# Patient Record
Sex: Male | Born: 1960
Health system: Southern US, Community
[De-identification: ages and names within clinical notes are randomized; demographics above are authoritative.]

## PROBLEM LIST (undated history)

## (undated) DIAGNOSIS — J209 Acute bronchitis, unspecified: Secondary | ICD-10-CM

## (undated) DIAGNOSIS — G4730 Sleep apnea, unspecified: Secondary | ICD-10-CM

## (undated) DIAGNOSIS — N401 Enlarged prostate with lower urinary tract symptoms: Secondary | ICD-10-CM

## (undated) DIAGNOSIS — N529 Male erectile dysfunction, unspecified: Secondary | ICD-10-CM

## (undated) DIAGNOSIS — I10 Essential (primary) hypertension: Secondary | ICD-10-CM

## (undated) DIAGNOSIS — T7840XA Allergy, unspecified, initial encounter: Secondary | ICD-10-CM

## (undated) DIAGNOSIS — N419 Inflammatory disease of prostate, unspecified: Secondary | ICD-10-CM

## (undated) DIAGNOSIS — E669 Obesity, unspecified: Secondary | ICD-10-CM

## (undated) DIAGNOSIS — G4733 Obstructive sleep apnea (adult) (pediatric): Secondary | ICD-10-CM

## (undated) DIAGNOSIS — D126 Benign neoplasm of colon, unspecified: Secondary | ICD-10-CM

## (undated) DIAGNOSIS — I878 Other specified disorders of veins: Secondary | ICD-10-CM

## (undated) DIAGNOSIS — Z8719 Personal history of other diseases of the digestive system: Secondary | ICD-10-CM

## (undated) DIAGNOSIS — M109 Gout, unspecified: Secondary | ICD-10-CM

## (undated) HISTORY — DX: Obesity, unspecified: E66.9

## (undated) HISTORY — DX: Morbid (severe) obesity due to excess calories: E66.01

## (undated) HISTORY — DX: Obstructive sleep apnea (adult) (pediatric): G47.33

## (undated) HISTORY — PX: VARICOCELE EXCISION: SUR582

## (undated) HISTORY — DX: Sleep apnea, unspecified: G47.30

## (undated) HISTORY — PX: LASER ABLATION: SHX1947

## (undated) HISTORY — DX: Benign neoplasm of colon, unspecified: D12.6

## (undated) HISTORY — DX: Allergy, unspecified, initial encounter: T78.40XA

## (undated) HISTORY — DX: Acute bronchitis, unspecified: J20.9

## (undated) HISTORY — DX: Essential (primary) hypertension: I10

---

## 1997-10-21 ENCOUNTER — Encounter: Payer: Self-pay | Admitting: Internal Medicine

## 1998-02-24 ENCOUNTER — Ambulatory Visit: Admission: RE | Admit: 1998-02-24 | Discharge: 1998-02-24 | Payer: Self-pay | Admitting: Internal Medicine

## 2003-04-28 ENCOUNTER — Ambulatory Visit (HOSPITAL_BASED_OUTPATIENT_CLINIC_OR_DEPARTMENT_OTHER): Admission: RE | Admit: 2003-04-28 | Discharge: 2003-04-28 | Payer: Self-pay | Admitting: Internal Medicine

## 2005-01-02 ENCOUNTER — Ambulatory Visit: Payer: Self-pay | Admitting: Internal Medicine

## 2007-02-23 ENCOUNTER — Encounter: Admission: RE | Admit: 2007-02-23 | Discharge: 2007-02-23 | Payer: Self-pay | Admitting: Internal Medicine

## 2007-05-13 ENCOUNTER — Encounter: Admission: RE | Admit: 2007-05-13 | Discharge: 2007-08-11 | Payer: Self-pay | Admitting: Internal Medicine

## 2007-06-05 ENCOUNTER — Ambulatory Visit: Payer: Self-pay | Admitting: Internal Medicine

## 2008-01-18 ENCOUNTER — Ambulatory Visit: Payer: Self-pay | Admitting: Vascular Surgery

## 2008-06-02 DIAGNOSIS — I10 Essential (primary) hypertension: Secondary | ICD-10-CM | POA: Insufficient documentation

## 2008-06-02 DIAGNOSIS — G4733 Obstructive sleep apnea (adult) (pediatric): Secondary | ICD-10-CM | POA: Insufficient documentation

## 2008-06-02 HISTORY — DX: Morbid (severe) obesity due to excess calories: E66.01

## 2008-06-03 ENCOUNTER — Ambulatory Visit: Payer: Self-pay | Admitting: Internal Medicine

## 2008-09-02 ENCOUNTER — Ambulatory Visit: Payer: Self-pay | Admitting: Vascular Surgery

## 2008-11-02 ENCOUNTER — Ambulatory Visit: Payer: Self-pay | Admitting: Gastroenterology

## 2008-11-03 DIAGNOSIS — D126 Benign neoplasm of colon, unspecified: Secondary | ICD-10-CM

## 2008-11-03 HISTORY — DX: Benign neoplasm of colon, unspecified: D12.6

## 2008-11-07 ENCOUNTER — Encounter: Payer: Self-pay | Admitting: Gastroenterology

## 2008-11-07 ENCOUNTER — Ambulatory Visit (HOSPITAL_COMMUNITY): Admission: RE | Admit: 2008-11-07 | Discharge: 2008-11-07 | Payer: Self-pay | Admitting: Gastroenterology

## 2008-11-07 ENCOUNTER — Ambulatory Visit: Payer: Self-pay | Admitting: Gastroenterology

## 2008-11-09 ENCOUNTER — Encounter: Payer: Self-pay | Admitting: Gastroenterology

## 2009-03-29 ENCOUNTER — Encounter: Payer: Self-pay | Admitting: Internal Medicine

## 2009-04-06 ENCOUNTER — Telehealth (INDEPENDENT_AMBULATORY_CARE_PROVIDER_SITE_OTHER): Payer: Self-pay | Admitting: *Deleted

## 2009-04-22 ENCOUNTER — Encounter: Payer: Self-pay | Admitting: Internal Medicine

## 2009-11-01 ENCOUNTER — Telehealth: Payer: Self-pay | Admitting: Internal Medicine

## 2009-11-02 ENCOUNTER — Telehealth (INDEPENDENT_AMBULATORY_CARE_PROVIDER_SITE_OTHER): Payer: Self-pay | Admitting: *Deleted

## 2009-11-03 ENCOUNTER — Ambulatory Visit: Payer: Self-pay | Admitting: Internal Medicine

## 2009-11-13 ENCOUNTER — Telehealth: Payer: Self-pay | Admitting: Internal Medicine

## 2010-06-29 ENCOUNTER — Ambulatory Visit: Payer: Self-pay | Admitting: Internal Medicine

## 2010-06-29 DIAGNOSIS — J209 Acute bronchitis, unspecified: Secondary | ICD-10-CM | POA: Insufficient documentation

## 2010-09-04 NOTE — Progress Notes (Signed)
Summary: order  Phone Note Call from Patient Call back at Home Phone [phone]   Caller: Patient Call For: [name] Reason for Call: Talk to Nurse Summary of Call: fax to [facility] order for cpap supplies - mask & head gear respironic comfort gel Initial call taken by: Eugene Gavia,  November 01, 2009 4:02 PM  Follow-up for Phone Call        Pt states [facility] is no longer in network for his insurance so he needs a new order sent to [facility] for cpap supplies, new mask of choice, head gear, and respironic comfort gel. Please advise if ok to palce order. Also pt wants refill on temazepam sent to [facility]. Please advise. Carron Curie CMA  November 01, 2009 4:26 PM   Additional Follow-up for Phone Call Additional follow up Details #1::        1) i will direct CPAP mask order through [facility].   2) May refill his temazepam with 1 refill  3) He needs a routine OV scheduled with me. He is well past a year since last here.  Additional Follow-up by: Waymon Budge MD,  November 02, 2009 8:48 AM    Additional Follow-up for Phone Call Additional follow up Details #2::    LMTCB. Carron Curie CMA  November 02, 2009 8:53 AM  pt advised that order was placed for cpap mask and supplies and refill sent for temazepam. Also advised he needs f/u appt.  Pt scheduled to see CY tomorrow at 9 am. Rx called into pharmacy. Carron Curie CMA  November 02, 2009 10:45 AM    Prescriptions: TEMAZEPAM 15 MG CAPS (TEMAZEPAM) 1 at bedtime as needed  #30 x 1   Entered by:   Carron Curie CMA   Authorized by:   Waymon Budge MD   Signed by:   Carron Curie CMA on 11/02/2009   Method used:   Telephoned to ...       [facility]* (retail)       [address]       [place], [place]  [id number]       Ph: [phone]       Fax: [phone]   RxID:   [id number]

## 2010-09-04 NOTE — Assessment & Plan Note (Signed)
Summary: rov/ mbw   Primary Provider/Referring Provider:  Geoffry Paradise  CC:  Follow up-sleep using CPAP and doing well; C/O cough- productive at times-clear and thick to yellow in color, SOB, and and chest tightness.Marland Kitchen  History of Present Illness: From 06/07/07-  Last seen at the old office in October of 2005.  He has continued CPAP at 11 CWP but thinks he may need a pressure increase.  No specific problems.  He is not really aware of significant daytime sleepiness and is not told that he is snoring through the machine.  We had recorded a weight of 323 pounds in October of 2004 and no significant weight gain since then.   06/03/08- OSA has done well with cpap left at 11 cwp. No hx ENT surgery.  November 03, 2009- OSA, Allergic rhintis( Dr Stevphen Rochester)  He changed to [facility] for DME in his network and they need copy of sleep study. He continues at pressure 11 with a nasal mask.He is fully compliant using cpap all night every night. He denies snore through or significant daytime sleepiness and says he couldn't do without cpap now. Temazepam works ok used only occasionally. Rhinitis control is ok, not interfering with CPAP.  June 29, 2010- OSA, Allergic rhintis( Dr Stevphen Rochester)  Nurse-CC: Follow up-sleep using CPAP and doing well; C/O cough- productive at times-clear and thick to yellow in color, SOB, and chest tightness. Acute visit- says for weeks he has had tight chest, dry hacking cough, upper airway tickle., scant white phlegm. Z pak from PCP and mucinex did help some. Denies fever, sorethroat, ear ache or heart burn.      Preventive Screening-Counseling & Management  Alcohol-Tobacco     Smoking Status: never  Current Medications (verified): 1)  Lisinopril-Hydrochlorothiazide 20-12.5 Mg Tabs (Lisinopril-Hydrochlorothiazide) .Marland Kitchen.. 1 Once Daily 2)  Claritin 10 Mg Tabs (Loratadine) .Marland Kitchen.. 1 Once Daily 3)  Cpap 11 .... At Bedtime 4)  Temazepam 15 Mg Caps (Temazepam) .Marland Kitchen.. 1 At  Bedtime As Needed 5)  Allergy Vaccine .... Stevphen Rochester 6)  Fluticasone Propionate 50 Mcg/act Susp (Fluticasone Propionate) .Marland Kitchen.. 1-2 Puffs Each Nostril Once Daily 7)  Claritin-D 12 Hour 5-120 Mg Xr12h-Tab (Loratadine-Pseudoephedrine) .... 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 Single   Risk Factors: Smoking Status: never (06/29/2010)  Past Medical History: OBESITY (ICD-278.00) HYPERTENSION (ICD-401.9) OBSTRUCTIVE SLEEP APNEA (ICD-327.23) Acute bronchitis  Review of Systems      See HPI       The patient complains of productive cough and nasal congestion/difficulty breathing through nose.  The patient denies shortness of breath with activity, shortness of breath at rest, non-productive cough, coughing up blood, chest pain, irregular heartbeats, acid heartburn, indigestion, loss of appetite, weight change, abdominal pain, difficulty swallowing, sore throat, tooth/dental problems, headaches, and sneezing.    Vital Signs:  Patient profile:   50 year old male Height:      75 inches Weight:      356.13 pounds BMI:     44.67 O2 Sat:      95 % on Room air Pulse rate:   84 / minute BP sitting:   132 / 88  (left arm) Cuff size:   large  Vitals Entered By: Reynaldo Minium CMA (June 29, 2010 2:37 PM)  O2 Flow:  Room air CC: Follow up-sleep using CPAP and doing well; C/O cough- productive at times-clear and thick to yellow in color, SOB, and chest tightness.   Physical Exam  Additional Exam:  General: A/Ox3; pleasant and cooperative, NAD, obese, tall, rather passive man SKIN: no rash, lesions NODES: no lymphadenopathy HEENT: [place]/AT, EOM- WNL, Conjuctivae- clear, PERRLA, TM-WNL, Nose- clear,  Throat- clear and wnl, Mallampati  II NECK: Supple w/ fair ROM, JVD- none, normal carotid impulses w/o bruits Thyroid- normal to palpation CHEST: Clear to P&A, not coughing or overtly wheezing.  HEART: RRR, no m/g/r heard ABDOMEN: Soft and nl;  JWJ:XBJY, nl pulses, no edema  NEURO: Grossly intact to observation      Impression & Recommendations:  Problem # 1:  ACUTE BRONCHITIS (ICD-466.0)  This is a bronchitis pattern. He isn't overtly wheezing and denies reflux. He has been on lisinopril 3 years w/o recognized problem but we discussed the possibility of ACEI side effects as well. For today, we will give him a neb, depo and another Zpak. He thinks the first Zpak made a real difference and suggests retrial.  His updated medication list for this problem includes:    Claritin-d 12 Hour 5-120 Mg Xr12h-tab (Loratadine-pseudoephedrine) .Marland Kitchen... Take 1 by mouth once daily  as needed    Zithromax Z-pak 250 Mg Tabs (Azithromycin) .Marland Kitchen... 2 today then one daily  Problem # 2:  OBSTRUCTIVE SLEEP APNEA (ICD-327.23)  Compliant w/ CPAP. Weight loss would help.   Orders: Est. Patient Level III (78295)  Medications Added to Medication List This Visit: 1)  Cpap 11  .... At bedtime 2)  Claritin-d 12 Hour 5-120 Mg Xr12h-tab (Loratadine-pseudoephedrine) .... Take 1 by mouth once daily  as needed 3)  Zithromax Z-pak 250 Mg Tabs (Azithromycin) .... 2 today then one daily  Other Orders: Depo- Medrol 80mg  (J1040) Admin of Therapeutic Inj  intramuscular or subcutaneous (62130) Nebulizer Tx (86578)  Patient Instructions: 1)  Please schedule a follow-up appointment in 6 months. 2)  Neb xop 1.25 3)  depo 80 4)  script for Zpak 5)  script to refill Temazepam Prescriptions: TEMAZEPAM 15 MG CAPS (TEMAZEPAM) 1 at bedtime as needed  #30 x 1   Entered and Authorized by:   Waymon Budge MD   Signed by:   Waymon Budge MD on 06/29/2010   Method used:   Print then Give to Patient   RxID:    [id number] ZITHROMAX Z-PAK 250 MG TABS (AZITHROMYCIN) 2 today then one daily  #1 pak x 0   Entered and Authorized by:   Waymon Budge MD   Signed by:   Waymon Budge MD on 06/29/2010   Method used:   Print then Give to Patient   RxID:   [id number]      Medication Administration  Injection # 1:    Medication: Depo- Medrol 80mg     Diagnosis: ACUTE BRONCHITIS (ICD-466.0)    Route: IM    Site: L deltoid    Exp Date: 12/2012    Lot #: [id number]    Mfr: Pharmacia    Patient tolerated injection without complications    Given by: Randell Loop CMA (June 29, 2010 3:08 PM)  Orders Added: 1)  Depo- Medrol 80mg  [J1040] 2)  Admin of Therapeutic Inj  intramuscular or subcutaneous [96372] 3)  Nebulizer Tx [94640] 4)  Est. Patient Level III [25956]

## 2010-09-04 NOTE — Progress Notes (Signed)
Summary: [facility] waiting on order/ cpap supplies  Phone Note Call from Patient   Caller: Patient Call For: young Summary of Call: pt states that he spoke to [facility] "moments ago" and was told that they have NOT received a fax for cpap supplies. he needs these asap. their fax # per pt is: [phone]. pt # [phone] Initial call taken by: Tivis Ringer, CNA,  November 02, 2009 10:59 AM  Follow-up for Phone Call        spoke to [facility] and they received the order but pt is a new pt with them and they need sleep study. Carron Curie CMA  November 02, 2009 11:20 AM    Additional Follow-up for Phone Call Additional follow up Details #1::        ordered pt's paper chart to get copy of sleep study and will fax to [facility] Additional Follow-up by: Oneita Jolly,  November 02, 2009 11:50 AM

## 2010-09-04 NOTE — Progress Notes (Signed)
Summary: Rx for cpap supplies-refused by pt  Phone Note From Other Clinic Call back at [phone]   Caller: Efraim Kaufmann with [facility] Call For: Dr. Maple Hudson Summary of Call: [phone] Melissa with [facility] called and stated that pt has large ded on insurance and does not want to be supplied with cpap supplies as of now.  Just an FYI for you. Thanks, Alfonso Ramus  November 13, 2009 2:55 PM   Follow-up for Phone Call        noted Follow-up by: Waymon Budge MD,  November 13, 2009 3:27 PM

## 2010-09-04 NOTE — Assessment & Plan Note (Signed)
Summary: 1 yr f/u last seen 2009//jrc   Primary Provider/Referring Provider:  Geoffry Paradise  CC:  Yearly follow up visit-sleep.  History of Present Illness: From 06/07/07-  Last seen at the old office in October of 2005.  He has continued CPAP at 11 CWP but thinks he may need a pressure increase.  No specific problems.  He is not really aware of significant daytime sleepiness and is not told that he is snoring through the machine.  We had recorded a weight of 323 pounds in October of 2004 and no significant weight gain since then.   06/03/08- OSA has done well with cpap left at 11 cwp. No hx ENT surgery.  November 03, 2009- OSA, Allergic rhintis( Dr Stevphen Rochester)  He changed to [facility] for DME in his network and they need copy of sleep study. He continues at pressure 11 with a nasal mask.He is fully compliant using cpap all night every night. He denies snore through or significant daytime sleepiness and says he couldn't do without cpap now. Temazepam works ok used only occasionally. Rhinitis control is ok, not interfering with CPAP.       Preventive Screening-Counseling & Management  Alcohol-Tobacco     Smoking Status: never  Current Medications (verified): 1)  Lisinopril-Hydrochlorothiazide 20-12.5 Mg Tabs (Lisinopril-Hydrochlorothiazide) .Marland Kitchen.. 1 Once Daily 2)  Claritin 10 Mg Tabs (Loratadine) .Marland Kitchen.. 1 Once Daily 3)  Cpap 11 [facility] .... At Bedtime 4)  Temazepam 15 Mg Caps (Temazepam) .Marland Kitchen.. 1 At Bedtime As Needed 5)  Allergy Vaccine .... 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 Single   Review of Systems      See HPI  The patient denies anorexia, fever, weight loss, weight gain, vision loss, decreased hearing, hoarseness, chest pain, syncope, dyspnea on exertion, peripheral edema, prolonged cough, headaches, hemoptysis, and severe indigestion/heartburn.    Vital Signs:  Patient profile:   50 year old male Height:      75 inches Weight:      369.25 pounds BMI:     46.32 O2 Sat:      99 % on Room air Pulse rate:   72 / minute BP sitting:   124 / 92  (left arm) Cuff size:   large  Vitals Entered By: Reynaldo Minium CMA (November 03, 2009 9:12 AM)  O2 Flow:  Room air  Physical Exam  Additional Exam:  General: A/Ox3; pleasant and cooperative, NAD, obese, tall, rather passive man SKIN: no rash, lesions NODES: no lymphadenopathy HEENT: [place]/AT, EOM- WNL, Conjuctivae- clear, PERRLA, TM-WNL, Nose- clear, Throat- clear and wnl, Mallampati  II NECK: Supple w/ fair ROM, JVD- none, normal carotid impulses w/o bruits Thyroid- normal to palpation CHEST: Clear to P&A HEART: RRR, no m/g/r heard ABDOMEN: Soft and nl;  JXB:JYNW, nl pulses, no edema  NEURO: Grossly intact to observation      Impression & Recommendations:  Problem # 1:  OBSTRUCTIVE SLEEP APNEA (ICD-327.23)  Weight loss would help. He is comfortable and compliant with CPAP at the current pressure. We have updated his electronic  record.  Medications Added to Medication List This Visit: 1)  Cpap 11 [facility]  .... At bedtime 2)  Fluticasone Propionate 50 Mcg/act Susp (Fluticasone propionate) .Marland Kitchen.. 1-2 puffs each nostril once daily  Other Orders: Est. Patient Level III (16109)  Patient Instructions: 1)  [facility] needs copy of sleep study. Need paper chart 2)  Please schedule a follow-up appointment in 1 year.

## 2010-09-13 ENCOUNTER — Encounter (INDEPENDENT_AMBULATORY_CARE_PROVIDER_SITE_OTHER): Payer: PRIVATE HEALTH INSURANCE

## 2010-09-13 DIAGNOSIS — M79609 Pain in unspecified limb: Secondary | ICD-10-CM

## 2010-09-17 ENCOUNTER — Encounter (INDEPENDENT_AMBULATORY_CARE_PROVIDER_SITE_OTHER): Payer: PRIVATE HEALTH INSURANCE | Admitting: Vascular Surgery

## 2010-09-17 ENCOUNTER — Encounter: Payer: PRIVATE HEALTH INSURANCE | Admitting: Vascular Surgery

## 2010-09-17 DIAGNOSIS — I83893 Varicose veins of bilateral lower extremities with other complications: Secondary | ICD-10-CM

## 2010-09-18 NOTE — Consult Note (Signed)
NEW PATIENT CONSULTATION  Casey Roach, NABERS DOB:  April 21, 1961                                       09/17/2010 [id number]#:[id number]  Patient is a 50 year old male patient referred for severe venous insufficiency of the left leg.  This gentleman has had varicose veins in both legs for 10-15 years which have increased in size.  He has no history of deep venous thrombosis, thrombophlebitis, pulmonary emboli, or other clotting problems.  He developed a sore on his left lateral ankle a few weeks ago which has waxed and waned and is not resolved.  He also had an episode of bleeding 2-3 months ago in the same area.  He notices some swelling in the ankles and the feet as the day progresses and has been having tightness in his left calf over the last several weeks related to this above problem.  He states that he has aching, throbbing, and burning discomfort in both legs, left worse than right as the day progresses.  He occasionally will elevate his legs but does not wear elastic compression stockings nor take pain medicine on a regular basis.  CHRONIC MEDICAL PROBLEMS: 1. Borderline hypertension, treated with lisinopril. 2. Negative for diabetes, hyperlipidemia, coronary artery disease,     COPD, or stroke.  SOCIAL HISTORY:  He is single, works in Airline pilot.  Does not use tobacco. Drinks occasional alcohol.  FAMILY HISTORY:  Positive for borderline diabetes in his mother. Negative for coronary artery disease and stroke.  REVIEW OF SYSTEMS:  Negative for chest pain, dyspnea on exertion.  Does admit to chronic bronchitis on occasion, joint pain.  No lower extremity claudication, chest pain, dyspnea on exertion, or other specific symptoms.  All other systems are negative for review of systems.  PHYSICAL EXAMINATION:  Blood pressure 132/87, heart rate 95, respirations 22.  General:  He is an obese, middle aged male who is in no apparent distress, alert and oriented x3.   HEENT:  Normal for age. EOMs intact.  Lungs:  Clear to auscultation.  No rhonchi or wheezing. Cardiovascular:  Regular rhythm.  No murmurs.  Carotid pulses 3+.  No bruits audible.  Abdomen:  Obese.  No palpable masses.  Musculoskeletal: Free of major deformities.  Neurologic:  Normal.  Skin:  Free of rashes. Lower extremity exam reveals 3+ femoral, popliteal, dorsalis and posterior tibial pulses palpable bilaterally.  He has bulging varicosities in both legs in the great saphenous system beginning in the mid to distal thigh, extending down to the ankle.  Left leg has severe hyperpigmentation with a stasis ulcer over the lateral malleolus, measuring 1.5 cm in diameter with no purulent drainage.  There are multiple spider and reticular veins around the ankle and 1-2+ edema. The right leg has the same bulging varicosities with a lesser degree of hyperpigmentation.  No ulcerations are noted.  I reviewed his venous duplex exam, which was performed in our office last week, performed on the left leg only.  He has gross reflux in the left great saphenous vein up to and including the saphenofemoral junction feeding these varicosities with no DVT.  He also has a large incompetent perforator in the lower third of the left calf.  This patient has severe venous disease bilaterally, left worse than right, with complication of a stasis ulcer in the left ankle and hyperpigmentation with significant skin changes.  He  is at high risk of continuing to have ulcerations and recurrences if this heals.  He badly needs laser ablation of both great saphenous system with multiple stab phlebectomies.  I discussed this with him at length over a 20-30 minute period and answered his questions.  He has insurance issues at the present time and states he cannot have it performed currently.  We will prescribe short- leg elastic compression stockings, and he will treat the ulcer conservatively.  He will return in 3  months for further evaluation.  I discussed the potential for further problems of worsening ulceration with him, and he understands this.  We will see him in 3 months for continued follow-up.    Quita Skye Hart Rochester, M.D. Electronically Signed  JDL/MEDQ  D:  09/17/2010  T:  09/18/2010  Job:  [id number]  cc:   Geoffry Paradise, M.D.

## 2010-09-25 NOTE — Procedures (Unsigned)
DUPLEX DEEP VENOUS EXAM - LOWER EXTREMITY  INDICATION:  Pain, engorged varicosities, dependent edema.  HISTORY:  Edema:  Left lower extremity. Trauma/Surgery:  No. Pain:  Tenderness at the left lateral ankle region with "puffiness" for 3 days. PE:  No. Previous DVT:  No. Anticoagulants: Other:  DUPLEX EXAM:               CFV   SFV   PopV  PTV    GSV               R  L  R  L  R  L  R   L  R  L Thrombosis    o  o     o     o      o     o Spontaneous   +  +     +     +      +     + Phasic        +  +     +     +      +     + Augmentation  +  +     +     +      +     + Compressible  +  +     +     +      +     + Competent     o  o     +     +      +     o  Legend:  + - yes  o - no  p - partial  D - decreased  IMPRESSION: 1. No evidence of deep or superficial vein thrombosis noted throughout     the left lower extremity. 2. Patent varicosities noted at the left lateral ankle level. 3. Incidental note:  Reflux of >500 milliseconds noted in the left     greater saphenous vein and bilateral common femoral veins. 4. A preliminary report was called to Willis Modena, FNP on 09/13/2010     at 4:15.   _____________________________ V. Charlena Cross, MD  CH/MEDQ  D:  09/14/2010  T:  09/14/2010  Job:  [id number]

## 2010-10-27 ENCOUNTER — Emergency Department (HOSPITAL_COMMUNITY)
Admission: EM | Admit: 2010-10-27 | Discharge: 2010-10-27 | Disposition: A | Discharge status: Home / Self Care | Payer: PRIVATE HEALTH INSURANCE | Attending: Emergency Medicine | Admitting: Emergency Medicine

## 2010-10-27 DIAGNOSIS — Z79899 Other long term (current) drug therapy: Secondary | ICD-10-CM | POA: Insufficient documentation

## 2010-10-27 DIAGNOSIS — I872 Venous insufficiency (chronic) (peripheral): Secondary | ICD-10-CM | POA: Insufficient documentation

## 2010-10-27 DIAGNOSIS — R58 Hemorrhage, not elsewhere classified: Secondary | ICD-10-CM | POA: Insufficient documentation

## 2010-10-27 DIAGNOSIS — I10 Essential (primary) hypertension: Secondary | ICD-10-CM | POA: Insufficient documentation

## 2010-10-27 DIAGNOSIS — E669 Obesity, unspecified: Secondary | ICD-10-CM | POA: Insufficient documentation

## 2010-10-27 LAB — BASIC METABOLIC PANEL
Calcium: 9.1 mg/dL (ref 8.4–10.5)
GFR calc Af Amer: >60 mL/min (ref >60)
GFR calc non Af Amer: >60 mL/min (ref >60)
Sodium: 135 mEq/L (ref 135–145)

## 2010-10-27 LAB — APTT: aPTT: 29 seconds (ref 24–37)

## 2010-10-27 LAB — PROTIME-INR
INR: 1.01 (ref 0.00–1.49)
Prothrombin Time: 13.5 seconds (ref 11.6–15.2)

## 2010-10-29 ENCOUNTER — Ambulatory Visit (INDEPENDENT_AMBULATORY_CARE_PROVIDER_SITE_OTHER): Payer: PRIVATE HEALTH INSURANCE | Admitting: Vascular Surgery

## 2010-10-29 DIAGNOSIS — I872 Venous insufficiency (chronic) (peripheral): Secondary | ICD-10-CM

## 2010-10-30 NOTE — Assessment & Plan Note (Signed)
OFFICE VISIT  Casey Roach, Casey Roach DOB:  08-Nov-1960                                       10/29/2010 [id number]#:[id number]  Patient returns today for further follow-up regarding his evaluation for severe venous insufficiency of both lower extremities performed February 13th of this year.  He has no history of DVT or thrombophlebitis but did have a stasis ulcer, which was being treated when I evaluated him on February 13.  This has continued to slowly improve.  It has not totally healed.  He was found to have severe reflux in the left great saphenous system throughout, up to and including the saphenofemoral junction which has caused severe venous hypertension in the left leg with bulging varicosities and the stasis ulcer.  The new finding is the fact that over the weekend he developed some bleeding from a small reticular vein distal to his left medial malleolus.  He was taken to the emergency department, where this resolved and did not require any sutures or other treatment.  He also has bulging varicosities in the right leg which are symptomatic but has not had an ultrasound to evaluate these at this time.  PHYSICAL EXAMINATION:  On exam today, the blood pressure is 129/86, heart rate 91, respirations 22.  The ulcer in the left lateral ankle area has contracted down to about 1 cm with an eschar surrounding it with no infection noted.  There is no evidence of any active bleeding on the medial aspect, but the area where the bleeding occurred was noted, which is distal to the left medial malleolus in a bed of multiple reticular veins.  There are bulging varicosities throughout the left medial calf and thigh area.  I discussed at length the fact that the patient does need laser ablation of his left great saphenous system and at a later time laser ablation of the contralateral right leg after it has been fully evaluated.  He will continue to wear short-leg stockings  and Ace wrap, as indicated, around the ankle and change dressings for the stasis ulcer.  If he has any recurrent bleeding, he will be in touch with [place], otherwise he will return in the next 3 months to consider scheduling his treatment at that time.    Quita Skye Hart Rochester, M.D. Electronically Signed  JDL/MEDQ  D:  10/29/2010  T:  10/30/2010  Job:  [id number]

## 2010-11-07 ENCOUNTER — Encounter (INDEPENDENT_AMBULATORY_CARE_PROVIDER_SITE_OTHER): Payer: PRIVATE HEALTH INSURANCE

## 2010-11-07 DIAGNOSIS — I83893 Varicose veins of bilateral lower extremities with other complications: Secondary | ICD-10-CM

## 2010-12-18 ENCOUNTER — Ambulatory Visit: Payer: PRIVATE HEALTH INSURANCE | Admitting: Vascular Surgery

## 2010-12-18 NOTE — Assessment & Plan Note (Signed)
[facility]                             PULMONARY OFFICE NOTE   NAME:[name], Casey Roach                     MRN:          [id number]  DATE:06/05/2007                            DOB:          [birth year]    PROBLEM:  1. Obstructive sleep apnea.  2. Hypertension.  3. Obesity.   HISTORY:  Last seen at the old office in October of 2005.  He has  continued CPAP at 11 CWP but thinks he may need a pressure increase.  No  specific problems.  He is not really aware of significant daytime  sleepiness and is not told that he is snoring through the machine.  We  had recorded a weight of 323 pounds in October of 2004 and no  significant weight gain since then.   MEDICATION:  1. Lisinopril HCT 20/12.5.  2. Claritin 10 mg.  3. CPAP at 11.  4. He is on allergy vaccine through Dr. Stevphen Rochester.  5. PRN use of Claritin.  6. Occasional use of temazepam 15 mg.   No medication allergy.   OBJECTIVE:  Weight now up to 367 pounds, BP 130/82, pulse 79, room air  saturation 96%.  He is appropriate and interactive.  Occasionally gets a  little disconjugate in his gaze, giving the impression he might be more  tired than he realizes.  There is turbinate edema left greater than  right.  Palate spacing is 2 to 3/4 without stridor or thyromegaly.  LUNGS:  Clear.  HEART SOUNDS:  Regular without murmur.   IMPRESSION:  Obstructive sleep apnea with significant weight gain, even  though he denies associated symptoms.  I think it is appropriate to try  turning his pressure up.   PLAN:  [facility]prea will increase CPAP to 12 CWP.  We discussed evaluation,  reviewed alternative therapies and reemphasized his responsibility to be  safe and alert while driving and to lose weight.  Schedule return in 1  year, earlier p.r.n.     Clinton D. Maple Hudson, MD, Tonny Bollman, FACP  Electronically Signed   CDY/MedQ  DD: 06/07/2007  DT: 06/08/2007  Job #: [id number]   cc:   Geoffry Paradise, M.D.

## 2010-12-24 ENCOUNTER — Ambulatory Visit: Payer: PRIVATE HEALTH INSURANCE | Admitting: Vascular Surgery

## 2011-09-19 ENCOUNTER — Encounter: Payer: Self-pay | Admitting: Internal Medicine

## 2011-09-20 ENCOUNTER — Ambulatory Visit (INDEPENDENT_AMBULATORY_CARE_PROVIDER_SITE_OTHER): Payer: PRIVATE HEALTH INSURANCE | Admitting: Internal Medicine

## 2011-09-20 ENCOUNTER — Encounter: Payer: Self-pay | Admitting: Internal Medicine

## 2011-09-20 ENCOUNTER — Ambulatory Visit (INDEPENDENT_AMBULATORY_CARE_PROVIDER_SITE_OTHER)
Admission: RE | Admit: 2011-09-20 | Discharge: 2011-09-20 | Disposition: A | Discharge status: Home / Self Care | Payer: PRIVATE HEALTH INSURANCE | Source: Ambulatory Visit | Attending: Internal Medicine | Admitting: Internal Medicine

## 2011-09-20 VITALS — BP 146/98 | HR 89 | Ht 75.0 in | Wt 367.0 lb

## 2011-09-20 DIAGNOSIS — R05 Cough: Secondary | ICD-10-CM

## 2011-09-20 DIAGNOSIS — J209 Acute bronchitis, unspecified: Secondary | ICD-10-CM

## 2011-09-20 DIAGNOSIS — R059 Cough, unspecified: Secondary | ICD-10-CM

## 2011-09-20 DIAGNOSIS — G4733 Obstructive sleep apnea (adult) (pediatric): Secondary | ICD-10-CM

## 2011-09-20 MED ORDER — TEMAZEPAM 15 MG PO CAPS: 15.0000 mg | ORAL_CAPSULE | Freq: Every evening | ORAL | Status: DC | PRN

## 2011-09-20 NOTE — Progress Notes (Signed)
09/20/11- 51 yoM never smoker followed for OSA, bronchitis, allergic rhinitis (Dr Stevphen Rochester) LOV- 06/29/10 He continues to use CPAP reliably all night every night. Now gets CPAP supplies on line. Temazepam 15 mg for very occasional use only. In the fall of 2011 and 2012 he had sustained bronchitis episodes. In between and ongoing, he describes a persistent dry cough with a little chest tightness. He denies reflux and has no history of recognized asthma. We discussed lisinopril/ACE inhibitor cough. He says last prior chest x-ray was "less than ideal". By his description I think he was referring to technical issues rather than a specific finding, but he asks repeat chest x-ray.  ROS-see HPI Constitutional:   No-   weight loss, night sweats, fevers, chills, fatigue, lassitude. HEENT:   No-  headaches, difficulty swallowing, tooth/dental problems, sore throat,       No-  sneezing, itching, ear ache, nasal congestion, post nasal drip,  CV:  No-   chest pain, orthopnea, PND, swelling in lower extremities, anasarca, dizziness, palpitations Resp: No-  acute shortness of breath with exertion or at rest.              No-   productive cough,  + non-productive cough,  No- coughing up of blood.              No-   change in color of mucus.  No- wheezing.   Skin: No-   rash or lesions. GI:  No-   heartburn, indigestion, abdominal pain, nausea, vomiting, diarrhea,                 change in bowel habits, loss of appetite GU:  MS:  No-   joint pain or swelling.  No- decreased range of motion.  No- back pain. Neuro-     nothing unusual Psych:  No- change in mood or affect. No depression or anxiety.  No memory loss.  OBJ- Physical Exam General- Alert, Oriented, Affect-appropriate, Distress- none acute, morbidly obese Skin- rash-none, lesions- none, excoriation- none Lymphadenopathy- none Head- atraumatic            Eyes- Gross vision intact, PERRLA, conjunctivae and secretions clear, ? proptosis    Ears- Hearing, canals-normal            Nose- Clear, no-Septal dev, mucus, polyps, erosion, perforation             Throat- Mallampati III , mucosa clear , drainage- none, tonsils- atrophic Neck- flexible , trachea midline, no stridor , thyroid nl, carotid no bruit Chest - symmetrical excursion , unlabored           Heart/CV- RRR , no murmur , no gallop  , no rub, nl s1 s2                           - JVD- none , edema- none, stasis changes- none, varices- none           Lung- clear to P&A, wheeze- none, cough- none , dullness-none, rub- none           Chest wall-  Abd-  Br/ Gen/ Rectal- Not done, not indicated Extrem- cyanosis- none, clubbing, none, atrophy- none, strength- nl Neuro- grossly intact to observation

## 2011-09-20 NOTE — Patient Instructions (Signed)
Try samples of Benicar 20/ HCTZ 12.5 , 1 daily for 1 month instead of the lisinopril HCTZ. See if you notice a real improvement in the chronic cough  Order- CXR  Dx cough  Refill script Temazepam  Continue CPAP 11

## 2011-09-22 NOTE — Assessment & Plan Note (Signed)
Bronchitis associated with fall season, 2 years in aerobic, probably viral. There is a persistent dry cough complaints which might be related to his ACE inhibitor. Plan: I was able to give samples of Benicar HCT 20/12.5 to take for one month and set up his lisinopril HCT. If he notices a significant improvement in cough, his primary physician can consider changing from lisinopril. CXR

## 2011-09-22 NOTE — Assessment & Plan Note (Addendum)
Good compliance and control. The pressure seems adequate. Sleep hygiene is adequate. Weight loss would make a big difference. Occasional refill temazepam.

## 2011-09-24 ENCOUNTER — Telehealth: Payer: Self-pay | Admitting: Internal Medicine

## 2011-09-24 NOTE — Telephone Encounter (Signed)
I called # provided above - [email or web address]  It looks like pt had a CXR at [facility] on 09/20/11 with Dr. Maple Hudson but do not see where Florentina Addison called him to give results.    Notes Recorded by Waymon Budge, MD on 09/21/2011 at 2:37 PM CXR- The lungs are clear with no active process. There is a little overinflation, seen with asthma, bronchitis and sometimes other causes of chronic cough   Katie, did you call pt for something other than CXR results?  Please advise.

## 2011-09-24 NOTE — Progress Notes (Signed)
Quick Note:  Pt aware of results. ______ 

## 2011-09-24 NOTE — Telephone Encounter (Signed)
I did not try to call patient; however, I did have help with my results box and someone called to leave a message for patient to call me back for results. They did not document as we normally do as LMTCB. I called patient back at number given and pt is aware of CXR results. Nothing further needed.

## 2012-03-13 ENCOUNTER — Telehealth: Payer: Self-pay | Admitting: Internal Medicine

## 2012-03-13 NOTE — Telephone Encounter (Signed)
Will forward to katie's box to be on the lookout for these forms.  i have not seen any come through.

## 2012-03-16 MED ORDER — ALBUTEROL SULFATE HFA 108 (90 BASE) MCG/ACT IN AERS: 2.0000 | INHALATION_SPRAY | Freq: Four times a day (QID) | RESPIRATORY_TRACT | Status: DC | PRN

## 2012-03-16 NOTE — Telephone Encounter (Signed)
Pt returned our call Emily E McAlister  °

## 2012-03-16 NOTE — Telephone Encounter (Signed)
Pt stated he did not send fax yet-wants to speak w/ Katie prior to sending & asked to be reached at [phone].  Antionette Fairy

## 2012-03-16 NOTE — Telephone Encounter (Signed)
Spoke with patient-states that he is faxing form over from [email or web address] to have CY fill out-he needs new mask for his machine. Also, patient states he and CY discussed at last OV about rescue inhaler to help with breathing issues at times. Pt would like to know if CY is willing to give him an inhaler. Thanks.

## 2012-03-16 NOTE — Telephone Encounter (Signed)
Spoke with patient-aware that I am still waiting on fax and will need to recheck in am; will send Rx for albuterol inhaler to [facility].

## 2012-03-16 NOTE — Telephone Encounter (Signed)
I have not seen form faxed from [email or web address], but he may have a script for replacement CPAP mask of choice and supplies for dx OSA, when the form shows up.  Ok script for albuterol HFA rescue inhaler, # 1, 2 puffs every 6 hours if needed, refill prn.

## 2012-03-17 NOTE — Telephone Encounter (Signed)
I have not gotten any papers on patient-please let patient know and have him refax to Triage number.

## 2012-03-17 NOTE — Telephone Encounter (Signed)
Spoke to pt & requested him to re-fax paperwork to triage's fax, [phone].  Antionette Fairy

## 2012-03-17 NOTE — Telephone Encounter (Signed)
Form received and placed on CY's cart.  Message routed to [facility] per protocol.

## 2012-03-17 NOTE — Telephone Encounter (Signed)
lmomtcb x1 for pt 

## 2012-03-18 NOTE — Telephone Encounter (Signed)
Patient calling asking the status of cpap supplies.

## 2012-03-18 NOTE — Telephone Encounter (Signed)
Left message on voicemail that fax has been filled out by CY and faxed back. Sent to HIM to scan in EPIC.

## 2012-03-25 IMAGING — CR DG CHEST 2V
2 series · 2 of 2 positions shown · non-contrast
Comparison: None

CLINICAL DATA: Chronic cough, hypertension

CHEST - 2 VIEW

[view not recorded (1 of 2)]
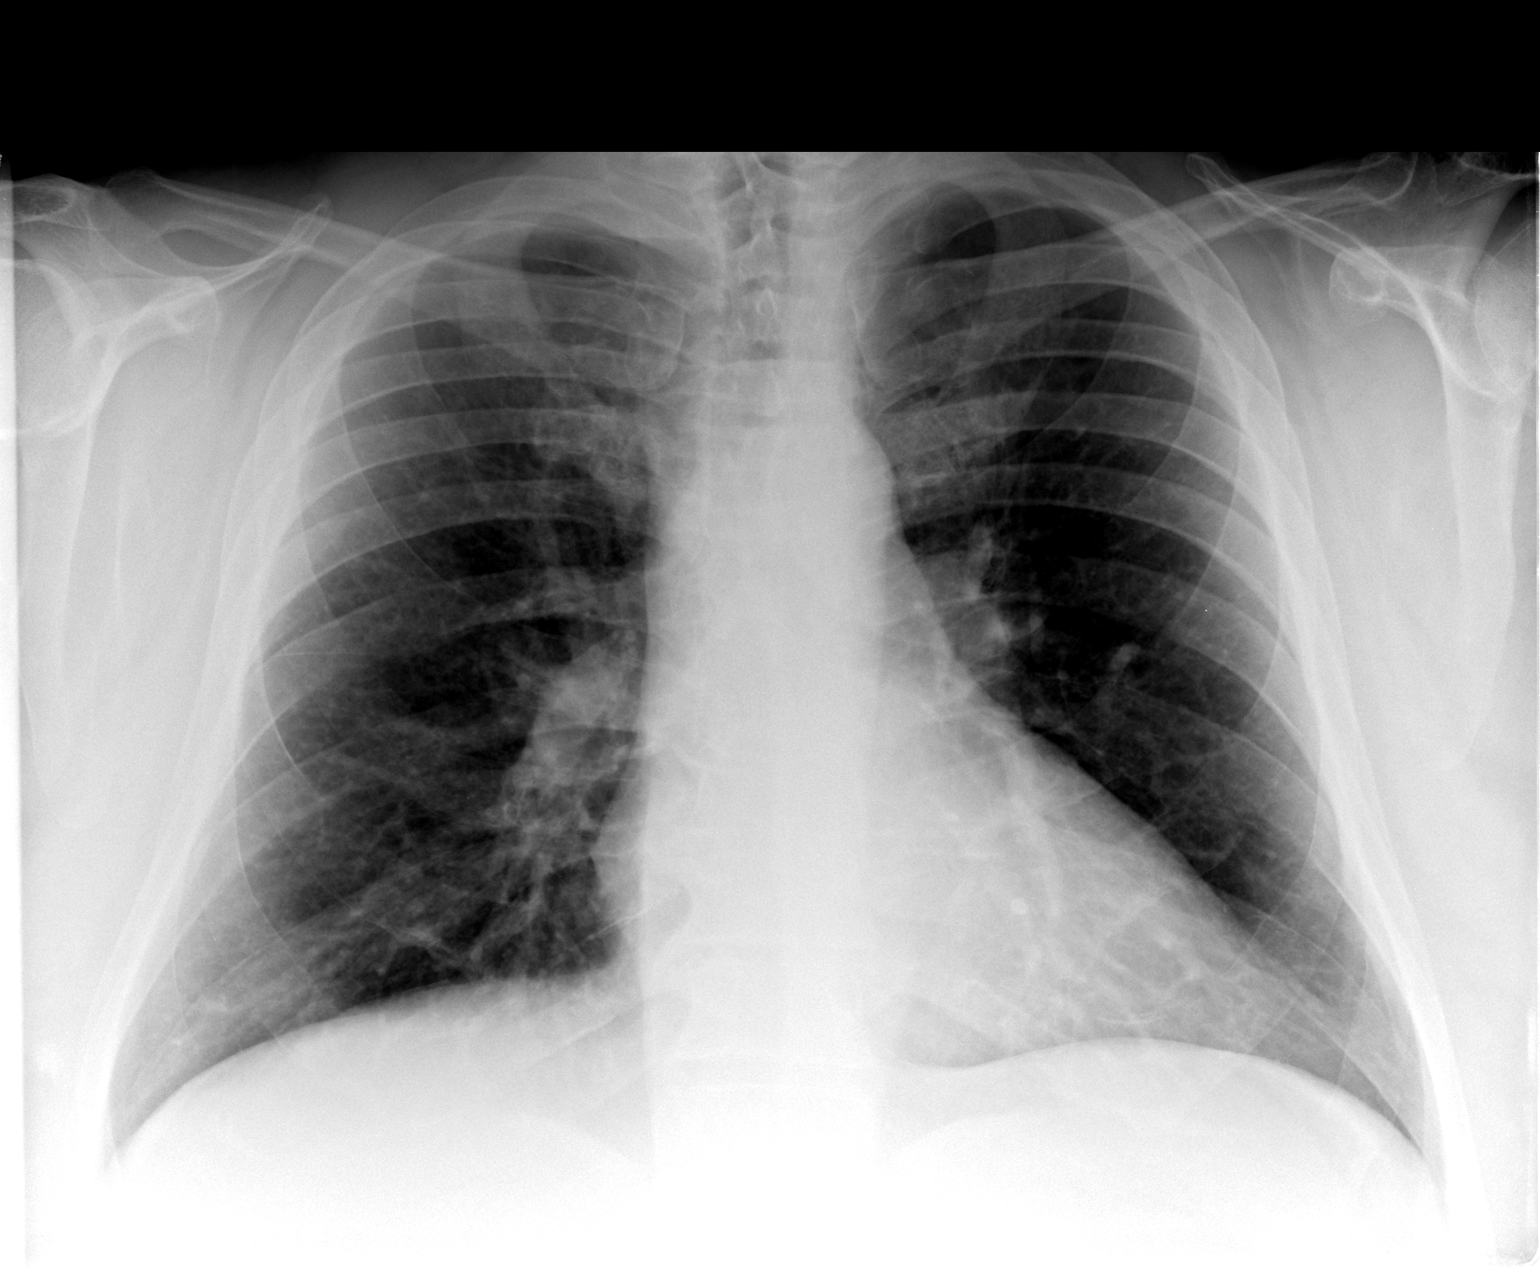

[view not recorded (2 of 2)]
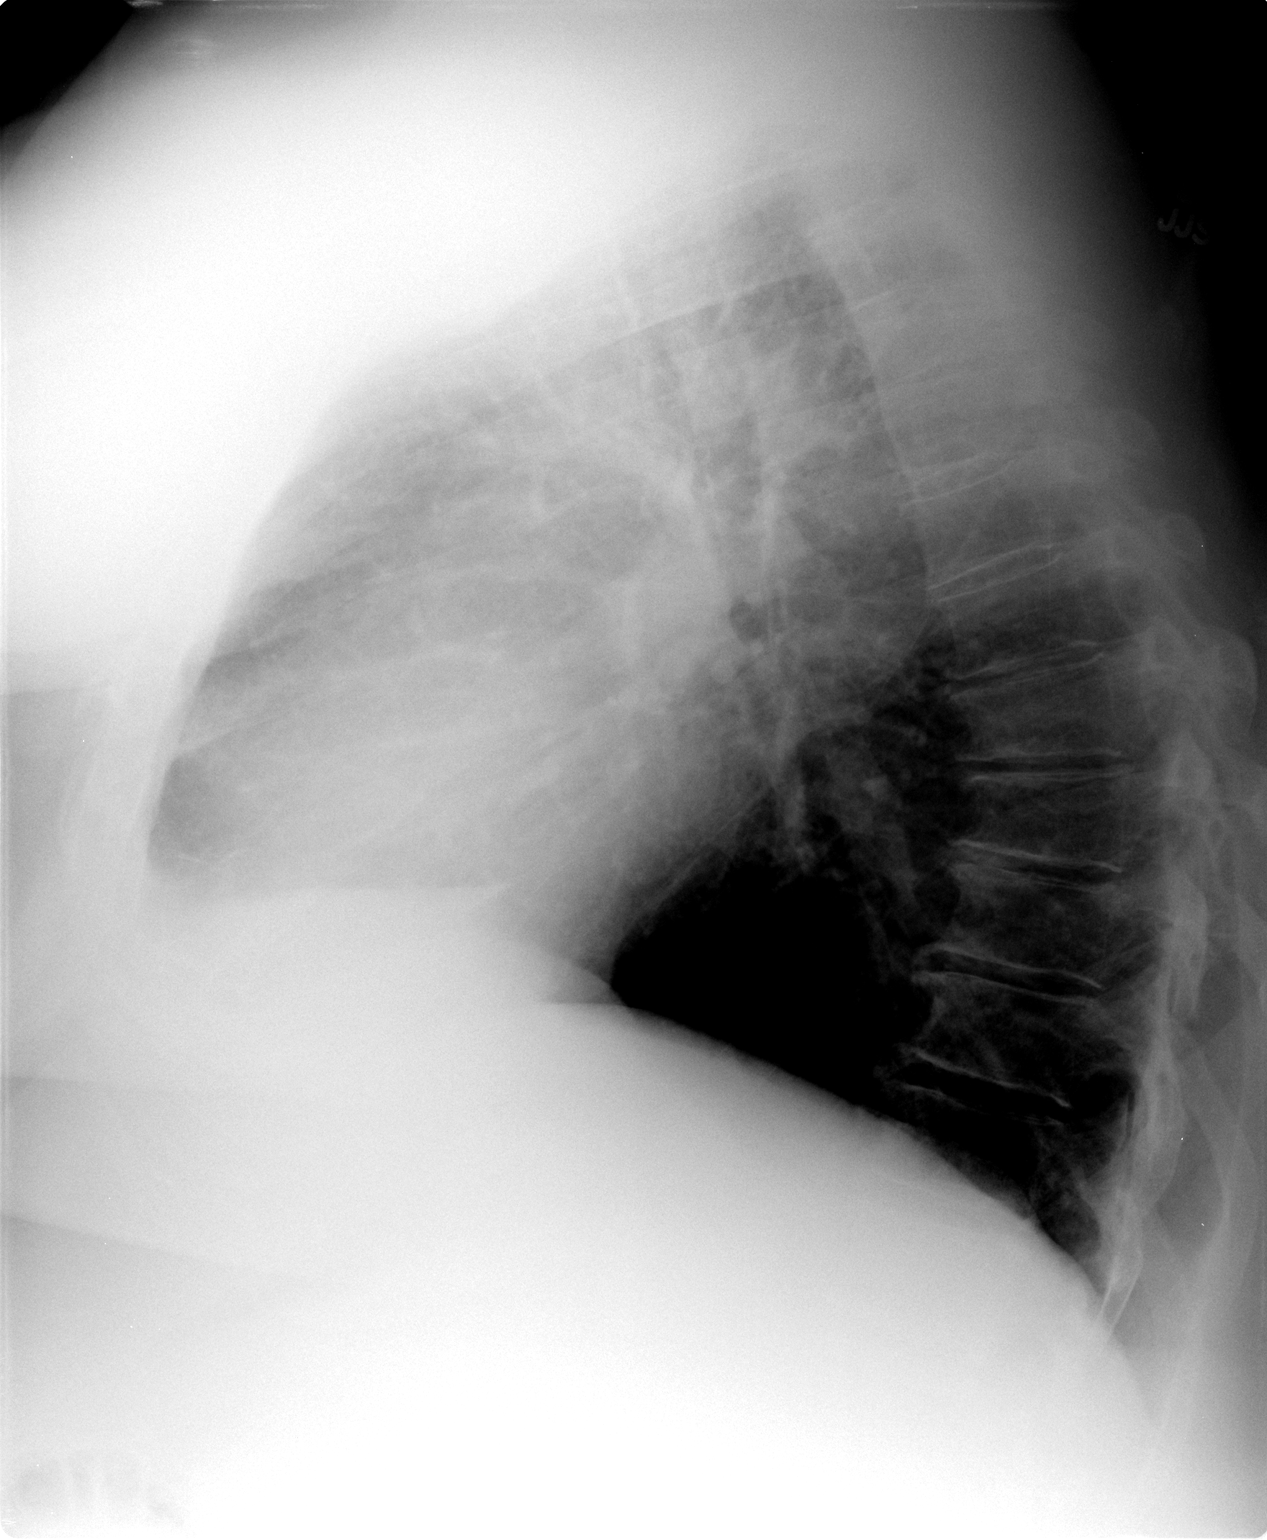

[2 of 2 positions shown; findings below may reference images not displayed]

FINDINGS: The lungs are clear but hyperaerated.  The heart is
within upper limits of normal.  No acute bony abnormality is seen.
IMPRESSION: No active lung disease.  Slight hyperaeration.

## 2012-09-21 ENCOUNTER — Encounter: Payer: Self-pay | Admitting: Internal Medicine

## 2012-09-21 ENCOUNTER — Ambulatory Visit (INDEPENDENT_AMBULATORY_CARE_PROVIDER_SITE_OTHER): Payer: BC Managed Care – PPO | Admitting: Internal Medicine

## 2012-09-21 VITALS — BP 128/74 | HR 92 | Ht 75.0 in | Wt 364.0 lb

## 2012-09-21 DIAGNOSIS — G4733 Obstructive sleep apnea (adult) (pediatric): Secondary | ICD-10-CM

## 2012-09-21 DIAGNOSIS — J209 Acute bronchitis, unspecified: Secondary | ICD-10-CM

## 2012-09-21 MED ORDER — TEMAZEPAM 15 MG PO CAPS: 15.0000 mg | ORAL_CAPSULE | Freq: Every evening | ORAL | Status: DC | PRN

## 2012-09-21 NOTE — Progress Notes (Signed)
09/20/11- 51 yoM never smoker followed for OSA, bronchitis, allergic rhinitis (Dr Stevphen Rochester) LOV- 06/29/10 He continues to use CPAP reliably all night every night. Now gets CPAP supplies on line. Temazepam 15 mg for very occasional use only. In the fall of 2011 and 2012 he had sustained bronchitis episodes. In between and ongoing, he describes a persistent dry cough with a little chest tightness. He denies reflux and has no history of recognized asthma. We discussed lisinopril/ACE inhibitor cough. He says last prior chest x-ray was "less than ideal". By his description I think he was referring to technical issues rather than a specific finding, but he asks repeat chest x-ray.  09/21/12- 51 yoM never smoker followed for OSA, bronchitis, allergic rhinitis (Dr Barnetta Chapel) FOLLOWS FOR: wears CPAP 11 every night for about 7 hours and pressure working well for patient; has had to use inhaler more than usual-after eating has noticed chest tightness and having to use inhaler then. Slight increase in wheezing(per patient this is rare for him to have). He gets his own CPAP suppies on line with no DME supplier. CXR 09/20/11-  IMPRESSION:  No active lung disease. Slight hyperaeration.  Original Report Authenticated By: Juline Patch, M.D.   ROS-see HPI Constitutional:   No-   weight loss, night sweats, fevers, chills, fatigue, lassitude. HEENT:   No-  headaches, difficulty swallowing, tooth/dental problems, sore throat,       No-  sneezing, itching, ear ache, nasal congestion, post nasal drip,  CV:  No-   chest pain, orthopnea, PND, swelling in lower extremities, anasarca, dizziness, palpitations Resp: No-  acute shortness of breath with exertion or at rest.              No-   productive cough,  + non-productive cough,  No- coughing up of blood.              No-   change in color of mucus.  No- wheezing.   Skin: No-   rash or lesions. GI:  No-   heartburn, indigestion, abdominal pain, nausea, vomiting, GU:   MS:  No-   joint pain or swelling.  Neuro-     nothing unusual Psych:  No- change in mood or affect. No depression or anxiety.  No memory loss.  OBJ- Physical Exam General- Alert, Oriented, Affect-appropriate, Distress- none acute, morbidly obese Skin- rash-none, lesions- none, excoriation- none Lymphadenopathy- none Head- atraumatic            Eyes- Gross vision intact, PERRLA, conjunctivae and secretions clear, ? proptosis            Ears- Hearing, canals-normal            Nose- Clear, no-Septal dev, mucus, polyps, erosion, perforation             Throat- Mallampati III , mucosa clear , drainage- none, tonsils- atrophic Neck- flexible , trachea midline, no stridor , thyroid nl, carotid no bruit Chest - symmetrical excursion , unlabored           Heart/CV- RRR , no murmur , no gallop  , no rub, nl s1 s2                           - JVD- none , edema- none, stasis changes- none, varices- none           Lung- clear to P&A, wheeze- none, cough- none , dullness-none, rub- none  Chest wall-  Abd-  Br/ Gen/ Rectal- Not done, not indicated Extrem- cyanosis- none, clubbing, none, atrophy- none, strength- nl Neuro- grossly intact to observation

## 2012-09-21 NOTE — Assessment & Plan Note (Signed)
Occasional painless association of substernal tightness with exertion or with eating. He asks about the role of his hiatal hernia. Consider differential including angina and exercise asthma versus swallowed air..\ Plan-refill rescue inhaler

## 2012-09-21 NOTE — Patient Instructions (Addendum)
Script refilling the temazepam  Please call as needed

## 2012-09-21 NOTE — Assessment & Plan Note (Signed)
He is satisfied getting his own supplies but we did talk about replacement machine and had a right prescription. He rarely needs temazepam as a sleep consolidation help. He would like to refill the temazepam study is available. We also need to get his old sleep study from the paper chart

## 2012-10-15 ENCOUNTER — Encounter: Payer: Self-pay | Admitting: Internal Medicine

## 2012-12-23 ENCOUNTER — Encounter (INDEPENDENT_AMBULATORY_CARE_PROVIDER_SITE_OTHER): Payer: BC Managed Care – PPO

## 2012-12-23 DIAGNOSIS — I83893 Varicose veins of bilateral lower extremities with other complications: Secondary | ICD-10-CM

## 2013-06-04 ENCOUNTER — Other Ambulatory Visit: Payer: Self-pay | Admitting: Internal Medicine

## 2013-06-11 ENCOUNTER — Other Ambulatory Visit: Payer: Self-pay | Admitting: Internal Medicine

## 2013-06-11 MED ORDER — TEMAZEPAM 15 MG PO CAPS: 15.0000 mg | ORAL_CAPSULE | Freq: Every evening | ORAL | Status: DC | PRN

## 2013-06-11 NOTE — Telephone Encounter (Signed)
Called refill to pharmacy voicemail.  

## 2013-07-09 ENCOUNTER — Encounter (HOSPITAL_COMMUNITY): Payer: Self-pay | Admitting: *Deleted

## 2013-07-09 ENCOUNTER — Other Ambulatory Visit (HOSPITAL_COMMUNITY): Payer: Self-pay | Admitting: Internal Medicine

## 2013-07-09 DIAGNOSIS — G4730 Sleep apnea, unspecified: Secondary | ICD-10-CM

## 2013-07-09 DIAGNOSIS — I10 Essential (primary) hypertension: Secondary | ICD-10-CM

## 2013-07-09 DIAGNOSIS — E669 Obesity, unspecified: Secondary | ICD-10-CM

## 2013-07-20 ENCOUNTER — Encounter (HOSPITAL_COMMUNITY): Payer: Self-pay

## 2013-07-21 ENCOUNTER — Ambulatory Visit (INDEPENDENT_AMBULATORY_CARE_PROVIDER_SITE_OTHER): Payer: BC Managed Care – PPO | Admitting: Cardiology

## 2013-07-21 ENCOUNTER — Encounter: Payer: Self-pay | Admitting: Cardiology

## 2013-07-21 ENCOUNTER — Encounter (INDEPENDENT_AMBULATORY_CARE_PROVIDER_SITE_OTHER): Payer: Self-pay

## 2013-07-21 ENCOUNTER — Encounter (HOSPITAL_COMMUNITY): Payer: Self-pay

## 2013-07-21 VITALS — BP 132/77 | HR 74 | Ht 75.0 in | Wt 360.0 lb

## 2013-07-21 DIAGNOSIS — I10 Essential (primary) hypertension: Secondary | ICD-10-CM

## 2013-07-21 DIAGNOSIS — R0600 Dyspnea, unspecified: Secondary | ICD-10-CM

## 2013-07-21 DIAGNOSIS — R0609 Other forms of dyspnea: Secondary | ICD-10-CM

## 2013-07-21 DIAGNOSIS — R0989 Other specified symptoms and signs involving the circulatory and respiratory systems: Secondary | ICD-10-CM

## 2013-07-21 NOTE — Progress Notes (Signed)
[address] [place], [place]  [address] Phone: [phone] Fax:  [phone]  Date:  07/21/2013   ID:  Casey Roach, DOB Nov 16, 1960, MRN [id number]  PCP:  Minda Meo, MD   History of Present Illness: Casey Roach is a 52 y.o. male With morbid obesity, obstructive sleep apnea. Dr. Jacky Kindle is his PCP. Had SOB with mild exertion. Air belching. No GERD, no heart burn. Feels like breathing improves when belching. Trying to work him in for stress test. No pre-auth. Has fatigue. If flight of stairs, yard work feels exerted.   No prior heart issues. Been years since last test.   Rare tightness in left arm.      Wt Readings from Last 3 Encounters:  07/21/13 360 lb (163.295 kg)  09/21/12 364 lb (165.109 kg)  09/20/11 367 lb (166.47 kg)     Past Medical History  Diagnosis Date  . Obesity, unspecified   . Unspecified essential hypertension   . Obstructive sleep apnea (adult) (pediatric)   . Acute bronchitis     Past Surgical History  Procedure Laterality Date  . Varicocele excision      Current Outpatient Prescriptions  Medication Sig Dispense Refill  . fluticasone (FLONASE) 50 MCG/ACT nasal spray Place 2 sprays into the nose daily.      Marland Kitchen lisinopril-hydrochlorothiazide (PRINZIDE,ZESTORETIC) 20-12.5 MG per tablet Take 1 tablet by mouth daily.      Marland Kitchen loratadine (CLARITIN) 10 MG tablet Take 10 mg by mouth daily.      Marland Kitchen loratadine-pseudoephedrine (CLARITIN-D 12-HOUR) 5-120 MG per tablet Take 1 tablet by mouth daily as needed.      . NON FORMULARY Allergy Vaccine -Meg Whelan      . NON FORMULARY CPAP 11      . PROAIR HFA 108 (90 BASE) MCG/ACT inhaler USE 2 PUFFS BY MOUTH EVERY 6 HOURS FOR WHEEZING. SHAKE WELL.  8.5 g  3  . temazepam (RESTORIL) 15 MG capsule Take 1 capsule (15 mg total) by mouth at bedtime as needed for sleep.  30 capsule  3  . VERSABASE CREA by Does not apply route.       No current facility-administered  medications for this visit.    Allergies:   No Known Allergies  Social History:  The patient  reports that he has never smoked. He does not have any smokeless tobacco history on file. He reports that he does not drink alcohol or use illicit drugs.   History reviewed. No pertinent family history.  ROS:  Please see the history of present illness.   Denies any fevers, chills, orthopnea, PND, syncope, rash, strokelike symptoms.  All other systems reviewed and negative.   PHYSICAL EXAM: VS:  BP 132/77  Pulse 74  Ht 6\' 3"  (1.905 m)  Wt 360 lb (163.295 kg)  BMI 45.00 kg/m2 Well nourished, well developed, in no acute distress HEENT: normal, [place]/AT, EOMI Neck: no JVD, normal carotid upstroke, no bruit Cardiac:  normal S1, S2; RRR; no murmur Lungs:  clear to auscultation bilaterally, no wheezing, rhonchi or rales Abd: soft, nontender, no hepatomegaly, no bruitsmorbid obesity Ext: no edema, 2+ distal pulses Skin: warm and dry GU: deferred Neuro: no focal abnormalities noted, AAO x 3  EKG:  Sinus rhythm, 74, no other abnormalities     ASSESSMENT AND PLAN:  1. Dyspnea on exertion-does not describe any chest tightness. Mild in severity. Certainly may be secondary to deconditioning.Cardiac risk factors include obesity, age.  Does not have diabetes, hyperlipidemia, no orally family history of coronary artery disease. No prior cardiovascular illness. I do think it is reasonable to proceed with exercise treadmill test. We discussed at length the differences in stress testing. Baseline EKG is normal. If exercise treadmill test is abnormal, we will proceed with nuclear stress test. 2. Morbid obesity-encourage weight loss, low carbohydrate. 3. Hypertension-continue with current regimen as managed by Dr. Jacky Kindle. 4. If symptoms worsen or become more worrisome, he knows to contact me.  Signed, Donato Schultz, MD [facility]  07/21/2013 12:05 PM

## 2013-07-21 NOTE — Patient Instructions (Signed)
Your physician recommends that you continue on your current medications as directed. Please refer to the Current Medication list given to you today.  Your physician has requested that you have an exercise tolerance test. For further information please visit [email or web address]. Please also follow instruction sheet, as given.  Your physician recommends that you schedule a follow-up as needed  

## 2013-07-23 ENCOUNTER — Telehealth: Payer: Self-pay | Admitting: *Deleted

## 2013-07-23 NOTE — Telephone Encounter (Signed)
Pt was returning your call.

## 2013-07-26 ENCOUNTER — Encounter (HOSPITAL_COMMUNITY): Payer: Self-pay

## 2013-07-28 ENCOUNTER — Ambulatory Visit (HOSPITAL_COMMUNITY)
Admission: RE | Admit: 2013-07-28 | Discharge: 2013-07-28 | Disposition: A | Discharge status: Home / Self Care | Payer: BC Managed Care – PPO | Source: Ambulatory Visit | Attending: Cardiovascular Disease | Admitting: Cardiovascular Disease

## 2013-07-28 DIAGNOSIS — R0600 Dyspnea, unspecified: Secondary | ICD-10-CM

## 2013-07-28 DIAGNOSIS — R0609 Other forms of dyspnea: Secondary | ICD-10-CM

## 2013-07-28 DIAGNOSIS — R0989 Other specified symptoms and signs involving the circulatory and respiratory systems: Secondary | ICD-10-CM

## 2013-08-04 ENCOUNTER — Telehealth: Payer: Self-pay | Admitting: Cardiology

## 2013-08-04 NOTE — Telephone Encounter (Signed)
Follow Up  Pt returning the call.. results//SR

## 2013-08-04 NOTE — Telephone Encounter (Signed)
Returned call to patient stress test results given.Copy mailed to patient.

## 2013-08-12 ENCOUNTER — Telehealth: Payer: Self-pay | Admitting: Cardiology

## 2013-08-12 NOTE — Telephone Encounter (Signed)
New Problem:   Pt is returning a call to [facility]

## 2013-09-03 ENCOUNTER — Encounter (INDEPENDENT_AMBULATORY_CARE_PROVIDER_SITE_OTHER): Payer: Self-pay

## 2013-09-03 DIAGNOSIS — I83893 Varicose veins of bilateral lower extremities with other complications: Secondary | ICD-10-CM

## 2013-09-21 ENCOUNTER — Ambulatory Visit: Payer: BC Managed Care – PPO | Admitting: Internal Medicine

## 2013-10-18 ENCOUNTER — Encounter: Payer: Self-pay | Admitting: Gastroenterology

## 2013-10-28 ENCOUNTER — Ambulatory Visit: Payer: BC Managed Care – PPO | Admitting: Internal Medicine

## 2013-11-29 ENCOUNTER — Encounter: Payer: Self-pay | Admitting: Internal Medicine

## 2013-11-29 ENCOUNTER — Ambulatory Visit (INDEPENDENT_AMBULATORY_CARE_PROVIDER_SITE_OTHER): Payer: BC Managed Care – PPO | Admitting: Internal Medicine

## 2013-11-29 ENCOUNTER — Encounter (INDEPENDENT_AMBULATORY_CARE_PROVIDER_SITE_OTHER): Payer: Self-pay

## 2013-11-29 VITALS — BP 130/72 | HR 76 | Ht 75.0 in | Wt 373.0 lb

## 2013-11-29 DIAGNOSIS — J209 Acute bronchitis, unspecified: Secondary | ICD-10-CM

## 2013-11-29 DIAGNOSIS — G4733 Obstructive sleep apnea (adult) (pediatric): Secondary | ICD-10-CM

## 2013-11-29 MED ORDER — ALBUTEROL SULFATE HFA 108 (90 BASE) MCG/ACT IN AERS: INHALATION_SPRAY | RESPIRATORY_TRACT | Status: DC

## 2013-11-29 NOTE — Patient Instructions (Addendum)
Order- Re-establish with DME- used [facility] in the past. New CPAP machine, autotitrate 5-20 cwp x 7 days for pressure recommendation, mask of choice, humidifier, supplies, DX OSA  Sample Symbicort 160 inhaler    2 puffs, then rinse mouth, twice daily. This is a maintenance inhaler for regular every day use.  Script sent to refill your albuterol rescue inhaler. This can be used 2 puffs, every 4-6 hours, if needed.  We will request the paper chart from warehouse, to look for your old sleep studies.

## 2013-11-29 NOTE — Progress Notes (Signed)
09/20/11- 51 yoM never smoker followed for OSA, bronchitis, allergic rhinitis (Dr Caprice Red) [place]- 06/29/10 He continues to use CPAP reliably all night every night. Now gets CPAP supplies on line. Temazepam 15 mg for very occasional use only. In the fall of 2011 and 2012 he had sustained bronchitis episodes. In between and ongoing, he describes a persistent dry cough with a little chest tightness. He denies reflux and has no history of recognized asthma. We discussed lisinopril/ACE inhibitor cough. He says last prior chest x-ray was "less than ideal". By his description I think he was referring to technical issues rather than a specific finding, but he asks repeat chest x-ray.  09/21/12- 51 yoM never smoker followed for OSA, bronchitis, allergic rhinitis (Dr Orvil Feil) FOLLOWS FOR: wears CPAP 11 every night for about 7 hours and pressure working well for patient; has had to use inhaler more than usual-after eating has noticed chest tightness and having to use inhaler then. Slight increase in wheezing(per patient this is rare for him to have). He gets his own CPAP suppies on line with no DME supplier. CXR 09/20/11-  IMPRESSION:  No active lung disease. Slight hyperaeration.  Original Report Authenticated By: Joretta Bachelor, M.D.  11/30/13- 30 yoM never smoker followed for OSA, bronchitis, allergic rhinitis (Dr Orvil Feil) FOLLOWS FOR:  Wearing CPAP 11/ On line supplier 8 hours per night.  Did not have insurance for some time and does again; would like to get set back up with [facility] needs new supplies   ROS-see HPI Constitutional:   No-   weight loss, night sweats, fevers, chills, fatigue, lassitude. HEENT:   No-  headaches, difficulty swallowing, tooth/dental problems, sore throat,       No-  sneezing, itching, ear ache, nasal congestion, post nasal drip,  CV:  No-   chest pain, orthopnea, PND, swelling in lower extremities, anasarca, dizziness, palpitations Resp: No-  acute shortness of breath with  exertion or at rest.              No-   productive cough,  + non-productive cough,  No- coughing up of blood.              No-   change in color of mucus.  No- wheezing.   Skin: No-   rash or lesions. GI:  No-   heartburn, indigestion, abdominal pain, nausea, vomiting, GU:  MS:  No-   joint pain or swelling.  Neuro-     nothing unusual Psych:  No- change in mood or affect. No depression or anxiety.  No memory loss.  OBJ- Physical Exam General- Alert, Oriented, Affect-appropriate, Distress- none acute, morbidly obese Skin- rash-none, lesions- none, excoriation- none Lymphadenopathy- none Head- atraumatic            Eyes- Gross vision intact, PERRLA, conjunctivae and secretions clear, ? proptosis            Ears- Hearing, canals-normal            Nose- Clear, no-Septal dev, mucus, polyps, erosion, perforation             Throat- Mallampati III , mucosa clear , drainage- none, tonsils- atrophic Neck- flexible , trachea midline, no stridor , thyroid nl, carotid no bruit Chest - symmetrical excursion , unlabored           Heart/CV- RRR , no murmur , no gallop  , no rub, nl s1 s2                           -  JVD- none , edema- none, stasis changes- none, varices- none           Lung- clear to P&A, wheeze- none, cough- none , dullness-none, rub- none           Chest wall-  Abd-  Br/ Gen/ Rectal- Not done, not indicated Extrem- cyanosis- none, clubbing, none, atrophy- none, strength- nl Neuro- grossly intact to observation

## 2013-12-25 NOTE — Assessment & Plan Note (Signed)
Sample Symbicort 160 and refill rescue inhaler

## 2013-12-25 NOTE — Assessment & Plan Note (Signed)
Plan- At his request, re-establish DME with [facility] for CPAP supplies and support

## 2014-01-17 ENCOUNTER — Other Ambulatory Visit: Payer: Self-pay | Admitting: *Deleted

## 2014-01-17 DIAGNOSIS — I83229 Varicose veins of left lower extremity with both ulcer of unspecified site and inflammation: Principal | ICD-10-CM

## 2014-01-17 DIAGNOSIS — L97919 Non-pressure chronic ulcer of unspecified part of right lower leg with unspecified severity: Secondary | ICD-10-CM

## 2014-01-17 DIAGNOSIS — I83219 Varicose veins of right lower extremity with both ulcer of unspecified site and inflammation: Secondary | ICD-10-CM

## 2014-01-17 DIAGNOSIS — L97929 Non-pressure chronic ulcer of unspecified part of left lower leg with unspecified severity: Principal | ICD-10-CM

## 2014-02-07 ENCOUNTER — Encounter: Payer: Self-pay | Admitting: Vascular Surgery

## 2014-02-08 ENCOUNTER — Ambulatory Visit (HOSPITAL_COMMUNITY)
Admission: RE | Admit: 2014-02-08 | Discharge: 2014-02-08 | Disposition: A | Discharge status: Home / Self Care | Payer: BC Managed Care – PPO | Source: Ambulatory Visit | Attending: Vascular Surgery | Admitting: Vascular Surgery

## 2014-02-08 ENCOUNTER — Ambulatory Visit (INDEPENDENT_AMBULATORY_CARE_PROVIDER_SITE_OTHER): Payer: BC Managed Care – PPO | Admitting: Vascular Surgery

## 2014-02-08 ENCOUNTER — Encounter: Payer: Self-pay | Admitting: Vascular Surgery

## 2014-02-08 VITALS — BP 148/86 | HR 90 | Resp 18 | Ht 75.0 in | Wt 350.0 lb

## 2014-02-08 DIAGNOSIS — I83222 Varicose veins of left lower extremity with both ulcer of calf and inflammation: Principal | ICD-10-CM

## 2014-02-08 DIAGNOSIS — L97929 Non-pressure chronic ulcer of unspecified part of left lower leg with unspecified severity: Secondary | ICD-10-CM

## 2014-02-08 DIAGNOSIS — I83225 Varicose veins of left lower extremity with both ulcer other part of foot and inflammation: Principal | ICD-10-CM

## 2014-02-08 DIAGNOSIS — I83221 Varicose veins of left lower extremity with both ulcer of thigh and inflammation: Secondary | ICD-10-CM

## 2014-02-08 DIAGNOSIS — L97919 Non-pressure chronic ulcer of unspecified part of right lower leg with unspecified severity: Secondary | ICD-10-CM

## 2014-02-08 DIAGNOSIS — I83224 Varicose veins of left lower extremity with both ulcer of heel and midfoot and inflammation: Principal | ICD-10-CM

## 2014-02-08 DIAGNOSIS — I83229 Varicose veins of left lower extremity with both ulcer of unspecified site and inflammation: Principal | ICD-10-CM

## 2014-02-08 DIAGNOSIS — I83223 Varicose veins of left lower extremity with both ulcer of ankle and inflammation: Principal | ICD-10-CM

## 2014-02-08 DIAGNOSIS — I83219 Varicose veins of right lower extremity with both ulcer of unspecified site and inflammation: Secondary | ICD-10-CM

## 2014-02-08 DIAGNOSIS — I83228 Varicose veins of left lower extremity with both ulcer of other part of lower extremity and inflammation: Secondary | ICD-10-CM

## 2014-02-08 NOTE — Progress Notes (Signed)
Subjective:     Patient ID: Casey Roach, male   DOB: 1960/09/30, 53 y.o.   MRN: [id number]  HPI this 53 year old male was previously evaluated by me in 2012 for severe bulging varicosities, chronic edema, stasis ulcer left ankle, and at that time he was found to have gross reflux in both greater saphenous veins. He had a DVT but did have deep venous reflux as well. It was recommended that he have laser ablation of great saphenous veins bilaterally with multiple stab phlebectomy. He has tried long-leg elastic compression stockings 20-30 mm gradient as well as elevation ibuprofen and has had no improvement in his symptoms. The ulcer of the left ankle heels for a short period of time and then recurs and currently is active.  Past Medical History  Diagnosis Date  . Obesity, unspecified   . Unspecified essential hypertension   . Obstructive sleep apnea (adult) (pediatric)   . Acute bronchitis   . Morbid obesity 06/02/2008    Qualifier: Diagnosis of  By: Julien Girt CMA, Leigh      History  Substance Use Topics  . Smoking status: Never Smoker   . Smokeless tobacco: Not on file  . Alcohol Use: No    History reviewed. No pertinent family history.  No Known Allergies  Current outpatient prescriptions:albuterol (PROAIR HFA) 108 (90 BASE) MCG/ACT inhaler, 2 puffs every 4-6 hours if needed- rescue, Disp: 8.5 g, Rfl: 3;  fluticasone (FLONASE) 50 MCG/ACT nasal spray, Place 2 sprays into the nose daily., Disp: , Rfl: ;  lisinopril-hydrochlorothiazide (PRINZIDE,ZESTORETIC) 20-12.5 MG per tablet, Take 1 tablet by mouth daily., Disp: , Rfl: ;  loratadine (CLARITIN) 10 MG tablet, Take 10 mg by mouth daily., Disp: , Rfl:  loratadine-pseudoephedrine (CLARITIN-D 12-HOUR) 5-120 MG per tablet, Take 1 tablet by mouth daily as needed., Disp: , Rfl: ;  NON FORMULARY, Allergy Vaccine -Meg Whelan, Disp: , Rfl: ;  NON FORMULARY, CPAP 11, Disp: , Rfl: ;  temazepam (RESTORIL) 15 MG capsule, Take 1 capsule (15 mg total) by  mouth at bedtime as needed for sleep., Disp: 30 capsule, Rfl: 3  BP 148/86  Pulse 90  Resp 18  Ht 6\' 3"  (1.905 m)  Wt 350 lb (158.759 kg)  BMI 43.75 kg/m2  Body mass index is 43.75 kg/(m^2).          Review of Systems complains of dyspnea on exertion, leg pain with walking, chronic edema, sleep apnea, long history of morbid obesity, other systems negative and complete review of systems    Objective:   Physical Exam BP 148/86  Pulse 90  Resp 18  Ht 6\' 3"  (1.905 m)  Wt 350 lb (158.759 kg)  BMI 43.75 kg/m2  Gen.-alert and oriented x3 in no apparent distress-morbidly obese  HEENT normal for age Lungs no rhonchi or wheezing Cardiovascular regular rhythm no murmurs carotid pulses 3+ palpable no bruits audible Abdomen soft nontender no palpable masses-morbidly obese  Musculoskeletal free of  major deformities Skin clear -no rashes Neurologic normal Lower extremities 3+ femoral and dorsalis pedis pulses palpable, ulceration lateral aspect left ankle measuring 1-1/2 cm in diameter. Large bulging varicosities bilaterally in the medial thigh medial calf with extensive network of reticular veins on to both ankle areas with chronic 1+ edema and hyperpigmentation bilaterally left worse than right.  Today a ordered a venous duplex exam of both lower extremities which are reviewed and interpreted. He has gross reflux in both greater saphenous veins with no DVT. Small saphenous veins have segments of  reflux but the caliber is smaller.        Assessment:     Severe gross reflux bilateral great saphenous veins with recurrent stasis ulcers left ankle and painful varicosities bilaterally. These are resistant to conservative measures including long-leg elastic compression stockings 20-30 mm gradient, elevation, and ibuprofen. There definitely affecting the patient's daily living and becoming progressively worse.    Plan:     Patient needs #1 laser ablation left great saphenous vein with  greater than 20 stab phlebectomy followed by #2 laser ablation right great saphenous vein with greater than 20 stab phlebectomy Will proceed with precertification perform this in the near future to hopefully heal the ulcer on the left side and relieved symptoms bilaterally and hopefully stabilize skin which is becoming severely diseased

## 2014-02-15 ENCOUNTER — Other Ambulatory Visit: Payer: Self-pay | Admitting: *Deleted

## 2014-02-15 DIAGNOSIS — I83893 Varicose veins of bilateral lower extremities with other complications: Secondary | ICD-10-CM

## 2014-03-08 ENCOUNTER — Encounter: Payer: Self-pay | Admitting: Gastroenterology

## 2014-03-25 ENCOUNTER — Encounter: Payer: Self-pay | Admitting: Vascular Surgery

## 2014-03-28 ENCOUNTER — Ambulatory Visit (INDEPENDENT_AMBULATORY_CARE_PROVIDER_SITE_OTHER): Payer: BC Managed Care – PPO | Admitting: Vascular Surgery

## 2014-03-28 ENCOUNTER — Encounter: Payer: Self-pay | Admitting: Vascular Surgery

## 2014-03-28 VITALS — BP 135/87 | HR 85 | Resp 16 | Ht 75.0 in | Wt 350.0 lb

## 2014-03-28 DIAGNOSIS — I83223 Varicose veins of left lower extremity with both ulcer of ankle and inflammation: Principal | ICD-10-CM

## 2014-03-28 DIAGNOSIS — L97929 Non-pressure chronic ulcer of unspecified part of left lower leg with unspecified severity: Secondary | ICD-10-CM

## 2014-03-28 DIAGNOSIS — I83225 Varicose veins of left lower extremity with both ulcer other part of foot and inflammation: Principal | ICD-10-CM

## 2014-03-28 DIAGNOSIS — I83229 Varicose veins of left lower extremity with both ulcer of unspecified site and inflammation: Principal | ICD-10-CM

## 2014-03-28 DIAGNOSIS — I83221 Varicose veins of left lower extremity with both ulcer of thigh and inflammation: Secondary | ICD-10-CM

## 2014-03-28 DIAGNOSIS — I83222 Varicose veins of left lower extremity with both ulcer of calf and inflammation: Principal | ICD-10-CM

## 2014-03-28 DIAGNOSIS — I83219 Varicose veins of right lower extremity with both ulcer of unspecified site and inflammation: Secondary | ICD-10-CM

## 2014-03-28 DIAGNOSIS — I83228 Varicose veins of left lower extremity with both ulcer of other part of lower extremity and inflammation: Secondary | ICD-10-CM

## 2014-03-28 DIAGNOSIS — I83224 Varicose veins of left lower extremity with both ulcer of heel and midfoot and inflammation: Principal | ICD-10-CM

## 2014-03-28 DIAGNOSIS — L97919 Non-pressure chronic ulcer of unspecified part of right lower leg with unspecified severity: Secondary | ICD-10-CM

## 2014-03-28 NOTE — Progress Notes (Signed)
   Laser Ablation Procedure      Date: 03/28/2014    Casey Roach DOB:29-Apr-1961  Consent signed: Yes  Surgeon:J.D. Kellie Simmering  Procedure: Laser Ablation: left Greater Saphenous Vein  BP 135/87  Pulse 85  Resp 16  Ht 6\' 3"  (1.905 m)  Wt 350 lb (158.759 kg)  BMI 43.75 kg/m2  Start time: 9:00   End time: 11:05  Tumescent Anesthesia: 11 cc 0.9% NaCl with 50 cc Lidocaine HCL with 1% Epi and 15 cc 8.4% NaHCO3  Local Anesthesia: 400 cc Lidocaine HCL and NaHCO3 (ratio 2:1)  Pulsed mode: 15 watts, 547ms delay, 1.0 duration Total energy: 2275, total pulses: 152, total time: 2:32     Stab Phlebectomy: >20 Sites: Thigh and Calf  Patient tolerated procedure well: Yes  Notes: Pt bled from various stab sites. Dr. Kellie Simmering put a stitch in one at top inner calf.  Description of Procedure:  After marking the course of the saphenous vein and the secondary varicosities in the standing position, the patient was placed on the operating table in the supine position, and the left leg was prepped and draped in sterile fashion. Local anesthetic was administered, and under ultrasound guidance the saphenous vein was accessed with a micro needle and guide wire; then the micro puncture sheath was placed. A guide wire was inserted to the saphenofemoral junction, followed by a 5 french sheath.  The position of the sheath and then the laser fiber below the junction was confirmed using the ultrasound and visualization of the aiming beam.  Tumescent anesthesia was administered along the course of the saphenous vein using ultrasound guidance. Protective laser glasses were placed on the patient, and the laser was fired at 15 watt pulsed mode advancing 1-2 mm per sec.  For a total of 2275 joules.  A steri strip was applied to the puncture site.  The patient was then put into Trendelenburg position.  Local anesthetic was utilized overlying the marked varicosities.  Greater than 20 stab wounds were made using the tip  of an 11 blade; and using the vein hook,  The phlebectomies were performed using a hemostat to avulse these varicosities.  Adequate hemostasis was achieved, and steri strips were applied to the stab wound.      ABD pads and thigh high compression stockings were applied.  Ace wrap bandages were applied over the phlebectomy sites and at the top of the saphenofemoral junction.  Blood loss was less than 15 cc.  The patient ambulated out of the operating room having tolerated the procedure well.

## 2014-03-28 NOTE — Progress Notes (Signed)
Subjective:     Patient ID: Casey Roach, male   DOB: [birth year], 53 y.o.   MRN: [id number]  HPI this 53 year old male had laser ablation of the left great saphenous vein plus greater than 20 stab phlebectomy of painful varicosities performed under local tumescent anesthesia for recurrent venous stasis ulcers pain and severe skin changes. A total of 2275 J of energy was utilized. He tolerated the procedure well.   Review of Systems     Objective:   Physical Exam BP 135/87  Pulse 85  Resp 16  Ht 6\' 3"  (1.905 m)  Wt 350 lb (158.759 kg)  BMI 43.75 kg/m2        Assessment:     Well-tolerated laser ablation left great saphenous spine plus greater than 20 stab phlebectomy of painful varicosities performed under local tumescent anesthesia    Plan:     Return in one week for venous duplex exam left leg to confirm closure and return a later date for same procedure on the contralateral right leg

## 2014-03-30 ENCOUNTER — Telehealth: Payer: Self-pay | Admitting: *Deleted

## 2014-03-30 NOTE — Telephone Encounter (Signed)
I tried about ten times to reach the patient. He called in at the end of the day saying he was having phone problems. He reported that he had had no more episodes of bleeding. We discussed at length how to progress with the shower Wed., etc Patient seems to have a good grasp on what to do if he starts to bleed again. Will see him next Tuesday for his fu visits. He is following all instructions.

## 2014-04-01 ENCOUNTER — Telehealth: Payer: Self-pay | Admitting: *Deleted

## 2014-04-01 NOTE — Telephone Encounter (Signed)
Patient called to triage re; slight "weeping coming from his wounds". I tried to call him at his numbers but could not reach him;  LMTCB.

## 2014-04-04 ENCOUNTER — Encounter (HOSPITAL_COMMUNITY): Payer: BC Managed Care – PPO

## 2014-04-04 ENCOUNTER — Ambulatory Visit: Payer: BC Managed Care – PPO | Admitting: Vascular Surgery

## 2014-04-04 ENCOUNTER — Encounter: Payer: Self-pay | Admitting: Vascular Surgery

## 2014-04-05 ENCOUNTER — Ambulatory Visit (HOSPITAL_COMMUNITY)
Admission: RE | Admit: 2014-04-05 | Discharge: 2014-04-05 | Disposition: A | Discharge status: Home / Self Care | Payer: BC Managed Care – PPO | Source: Ambulatory Visit | Attending: Vascular Surgery | Admitting: Vascular Surgery

## 2014-04-05 ENCOUNTER — Ambulatory Visit (INDEPENDENT_AMBULATORY_CARE_PROVIDER_SITE_OTHER): Payer: Self-pay | Admitting: Vascular Surgery

## 2014-04-05 ENCOUNTER — Encounter: Payer: Self-pay | Admitting: Vascular Surgery

## 2014-04-05 VITALS — BP 148/92 | HR 86 | Ht 75.0 in | Wt 359.0 lb

## 2014-04-05 DIAGNOSIS — I83229 Varicose veins of left lower extremity with both ulcer of unspecified site and inflammation: Principal | ICD-10-CM

## 2014-04-05 DIAGNOSIS — I83893 Varicose veins of bilateral lower extremities with other complications: Secondary | ICD-10-CM | POA: Diagnosis present

## 2014-04-05 DIAGNOSIS — I83221 Varicose veins of left lower extremity with both ulcer of thigh and inflammation: Secondary | ICD-10-CM

## 2014-04-05 DIAGNOSIS — L97929 Non-pressure chronic ulcer of unspecified part of left lower leg with unspecified severity: Secondary | ICD-10-CM

## 2014-04-05 DIAGNOSIS — I83224 Varicose veins of left lower extremity with both ulcer of heel and midfoot and inflammation: Principal | ICD-10-CM

## 2014-04-05 DIAGNOSIS — I83228 Varicose veins of left lower extremity with both ulcer of other part of lower extremity and inflammation: Secondary | ICD-10-CM

## 2014-04-05 DIAGNOSIS — I83222 Varicose veins of left lower extremity with both ulcer of calf and inflammation: Principal | ICD-10-CM

## 2014-04-05 DIAGNOSIS — I83219 Varicose veins of right lower extremity with both ulcer of unspecified site and inflammation: Secondary | ICD-10-CM

## 2014-04-05 DIAGNOSIS — L97919 Non-pressure chronic ulcer of unspecified part of right lower leg with unspecified severity: Secondary | ICD-10-CM

## 2014-04-05 DIAGNOSIS — I83225 Varicose veins of left lower extremity with both ulcer other part of foot and inflammation: Principal | ICD-10-CM

## 2014-04-05 DIAGNOSIS — I83223 Varicose veins of left lower extremity with both ulcer of ankle and inflammation: Principal | ICD-10-CM

## 2014-04-05 NOTE — Progress Notes (Signed)
Subjective:     Patient ID: Casey Roach, male   DOB: 18-Apr-1961, 53 y.o.   MRN: [id number]  HPI this 53 year old male returns for initial followup regarding his laser ablation of the left great saphenous line with greater than 20 stab phlebectomy of painful varicosities. He states that the lower leg feels much better than it did prior to the procedure. He has had some moderate discomfort along the course of the great saphenous vein with the ablation was performed. His biggest complaint has been irritation and excoriation of posterior thigh scan where the dressing was applied. The dressing was wrapped tightly because the patient did have significant venous bleeding from a few sites following the ablation. He is on antibiotics-doxycycline twice daily. He is using Telfa dressing over this area posteriorly but it has been irritated.   Review of Systems     Objective:   Physical Exam BP 148/92  Pulse 86  Ht 6\' 3"  (1.905 m)  Wt 359 lb (162.841 kg)  BMI 44.87 kg/m2  SpO2 98%  Gen. obese male in no apparent stress alert and oriented x3 Left leg with some linear excoriation in the very proximal thigh area posteriorly measuring about 8 cm in length and a centimeter in diameter. There is no evidence of infection. Mild erythema where the great saphenous vein is located with the ablation has been performed. Stab phlebectomy sites are all healing nicely with 1+ chronic edema.  Today I were venous duplex exam the left leg which are reviewed and interpreted. The left great saphenous vein is totally occluded from the knee to near the saphenofemoral junction and there is no DVT. He does have some reflux and an anterior accessory branch of the great saphenous vein in the thigh which I discussed with him      Assessment:     Successful laser blister left great saphenous vein with multiple stab phlebectomy of painful varicosities Patent anterior accessory branch left proximal thigh with reflux Excoriation  of skin posterior thigh from compression dressing    Plan:     Patient is scheduled to have contralateral right leg done on September 14. He will see how the left leg wound progresses to make decision regarding right leg scheduling in the near future

## 2014-04-18 ENCOUNTER — Other Ambulatory Visit: Payer: BC Managed Care – PPO | Admitting: Vascular Surgery

## 2014-04-18 ENCOUNTER — Ambulatory Visit (INDEPENDENT_AMBULATORY_CARE_PROVIDER_SITE_OTHER): Payer: Self-pay | Admitting: Vascular Surgery

## 2014-04-18 ENCOUNTER — Encounter: Payer: Self-pay | Admitting: Vascular Surgery

## 2014-04-18 VITALS — BP 150/86 | HR 69 | Ht 75.0 in | Wt 355.0 lb

## 2014-04-18 DIAGNOSIS — I83893 Varicose veins of bilateral lower extremities with other complications: Secondary | ICD-10-CM | POA: Insufficient documentation

## 2014-04-18 NOTE — Progress Notes (Signed)
Subjective:     Patient ID: Casey Roach, male   DOB: 14-Jan-1961, 53 y.o.   MRN: [id number]  HPI this 53 year old male returns for suture removal of a single suture placed in the left calf where bleeding occurred from a stab phlebectomy site when he had the laser ablation procedure a few weeks ago. He denies any chills and fever. He states the left calf is less tense than it was prior to procedure. He continues to treat his stasis ulcers with dressings at home and states that they are improving   Review of Systems     Objective:   Physical Exam BP 150/86  Pulse 69  Ht 6\' 3"  (1.905 m)  Wt 355 lb (161.027 kg)  BMI 44.37 kg/m2  Left leg with a single suture removed from proximal calf. 3 mm opening in this area which is not infected. Continues to have thickening of the skin which is less severe than previously. Excoriated area and left posterior proximal thigh is improving. Stasis ulcers identified. Medial stasis ulcer at the ankle is almost healed and laterally there are 2 small ulcers about 3 mm in size each which are uninfected.      Assessment:     Continues to make improvement and stasis ulcers left leg following laser ablation with multiple stab phlebectomy    Plan:     Will plan similar procedure on the contralateral right leg in about 4 weeks

## 2014-04-26 ENCOUNTER — Ambulatory Visit: Payer: BC Managed Care – PPO | Admitting: Vascular Surgery

## 2014-04-26 ENCOUNTER — Encounter (HOSPITAL_COMMUNITY): Payer: BC Managed Care – PPO

## 2014-05-04 ENCOUNTER — Other Ambulatory Visit: Payer: Self-pay | Admitting: *Deleted

## 2014-05-04 DIAGNOSIS — I83213 Varicose veins of right lower extremity with both ulcer of ankle and inflammation: Secondary | ICD-10-CM

## 2014-05-04 DIAGNOSIS — N529 Male erectile dysfunction, unspecified: Secondary | ICD-10-CM | POA: Insufficient documentation

## 2014-05-04 DIAGNOSIS — I83214 Varicose veins of right lower extremity with both ulcer of heel and midfoot and inflammation: Principal | ICD-10-CM

## 2014-05-04 DIAGNOSIS — I83219 Varicose veins of right lower extremity with both ulcer of unspecified site and inflammation: Principal | ICD-10-CM

## 2014-05-04 DIAGNOSIS — I83215 Varicose veins of right lower extremity with both ulcer other part of foot and inflammation: Principal | ICD-10-CM

## 2014-05-04 DIAGNOSIS — I83212 Varicose veins of right lower extremity with both ulcer of calf and inflammation: Principal | ICD-10-CM

## 2014-05-04 DIAGNOSIS — I83211 Varicose veins of right lower extremity with both ulcer of thigh and inflammation: Secondary | ICD-10-CM

## 2014-05-04 DIAGNOSIS — E291 Testicular hypofunction: Secondary | ICD-10-CM | POA: Insufficient documentation

## 2014-05-04 DIAGNOSIS — N401 Enlarged prostate with lower urinary tract symptoms: Secondary | ICD-10-CM | POA: Insufficient documentation

## 2014-05-04 DIAGNOSIS — I83218 Varicose veins of right lower extremity with both ulcer of other part of lower extremity and inflammation: Principal | ICD-10-CM

## 2014-05-10 ENCOUNTER — Telehealth: Payer: Self-pay

## 2014-05-10 ENCOUNTER — Ambulatory Visit (AMBULATORY_SURGERY_CENTER): Payer: Self-pay

## 2014-05-10 ENCOUNTER — Other Ambulatory Visit: Payer: Self-pay

## 2014-05-10 VITALS — Ht 75.0 in | Wt 359.2 lb

## 2014-05-10 DIAGNOSIS — Z86010 Personal history of colonic polyps: Secondary | ICD-10-CM

## 2014-05-10 DIAGNOSIS — Z1211 Encounter for screening for malignant neoplasm of colon: Secondary | ICD-10-CM

## 2014-05-10 MED ORDER — NA SULFATE-K SULFATE-MG SULF 17.5-3.13-1.6 GM/177ML PO SOLN: ORAL | Status: DC

## 2014-05-10 NOTE — Progress Notes (Signed)
Pt came into the office today for his pre-visit prior to his colonoscopy with Dr.Kaplan on 05/24/14. Due to weight restrictions (359.2 lbs) pt was informed he would need to be scheduled at [facility]. Pt understood. Medical history and prep instructions were given to the pt. Suprep was sent to his pharmacy and a note was sent to [employer] nurse.   Per pt, no allergies to soy or egg products.Pt not taking any weight loss meds or using  O2 at home.

## 2014-05-10 NOTE — Telephone Encounter (Signed)
Screening colonoscopy moved from [place] to [place] on 05/31/14. Arrive at 8:30 am. I have left message for the patient to call back

## 2014-05-10 NOTE — Addendum Note (Signed)
Addended by: Virgina Evener A on: 05/10/2014 01:40 PM   Modules accepted: Orders

## 2014-05-10 NOTE — Telephone Encounter (Signed)
Message copied by Greggory Keen on Tue May 10, 2014  1:40 PM ------      Message from: Rosanne Sack R      Created: Tue May 10, 2014 11:46 AM       I no longer have Dr. Deatra Ina, his nurse is now [facility]. I will send this to her.      ----- Message -----         From: Selina Cooley, RN         Sent: 05/10/2014  11:39 AM           To: Maury Dus, RN            Linda,This pt came into the office today for his pre-visit prior to his colonoscopy on 05/24/14 with Dr Deatra Ina.His weight was 359.2 lbs and BMI was 44.9.I did review all his medical history and gave prep instructions and informed him a nurse will call  him with a hospital appointment. He understood. The appt on 05/24/14 at [facility] was cancelled. Call me if you have any questions.THANKS!       ------

## 2014-05-11 ENCOUNTER — Telehealth: Payer: Self-pay | Admitting: *Deleted

## 2014-05-11 NOTE — Telephone Encounter (Signed)
Called patient to inform that Suprep sample kits will be in next week. Called Mitzi Hansen the drug rep to confirm  L/M for patient

## 2014-05-11 NOTE — Telephone Encounter (Signed)
I have left message for the patient to call back 

## 2014-05-12 NOTE — Telephone Encounter (Signed)
Patient has not returned my calls. Due to the importance of the message, I have mailed him a letter with his imformation.

## 2014-05-13 ENCOUNTER — Encounter: Payer: Self-pay | Admitting: Vascular Surgery

## 2014-05-13 ENCOUNTER — Encounter (HOSPITAL_COMMUNITY): Payer: Self-pay | Admitting: Pharmacy Technician

## 2014-05-16 ENCOUNTER — Encounter: Payer: Self-pay | Admitting: Vascular Surgery

## 2014-05-16 ENCOUNTER — Ambulatory Visit (INDEPENDENT_AMBULATORY_CARE_PROVIDER_SITE_OTHER): Payer: BC Managed Care – PPO | Admitting: Vascular Surgery

## 2014-05-16 ENCOUNTER — Encounter (HOSPITAL_COMMUNITY): Payer: Self-pay | Admitting: *Deleted

## 2014-05-16 ENCOUNTER — Encounter: Payer: Self-pay | Admitting: Gastroenterology

## 2014-05-16 VITALS — BP 150/94 | HR 76 | Resp 18 | Ht 75.0 in | Wt 352.0 lb

## 2014-05-16 DIAGNOSIS — I83223 Varicose veins of left lower extremity with both ulcer of ankle and inflammation: Secondary | ICD-10-CM

## 2014-05-16 DIAGNOSIS — I83228 Varicose veins of left lower extremity with both ulcer of other part of lower extremity and inflammation: Secondary | ICD-10-CM

## 2014-05-16 DIAGNOSIS — I83222 Varicose veins of left lower extremity with both ulcer of calf and inflammation: Secondary | ICD-10-CM

## 2014-05-16 DIAGNOSIS — I83229 Varicose veins of left lower extremity with both ulcer of unspecified site and inflammation: Secondary | ICD-10-CM

## 2014-05-16 DIAGNOSIS — I83225 Varicose veins of left lower extremity with both ulcer other part of foot and inflammation: Secondary | ICD-10-CM

## 2014-05-16 DIAGNOSIS — I83221 Varicose veins of left lower extremity with both ulcer of thigh and inflammation: Secondary | ICD-10-CM

## 2014-05-16 DIAGNOSIS — I83224 Varicose veins of left lower extremity with both ulcer of heel and midfoot and inflammation: Secondary | ICD-10-CM

## 2014-05-16 NOTE — Progress Notes (Signed)
   Laser Ablation Procedure      Date: 05/16/2014    Casey Roach DOB:1961/01/26  Consent signed: Yes  Surgeon:J.D. Kellie Simmering  Procedure: Laser Ablation: right Greater Saphenous Vein  BP 150/94  Pulse 76  Resp 18  Ht 6\' 3"  (1.905 m)  Wt 352 lb (159.666 kg)  BMI 44.00 kg/m2  Start time: 9am   End time: 10:45  Tumescent Anesthesia: 480 cc 0.9% NaCl with 50 cc Lidocaine HCL with 1% Epi and 15 cc 8.4% NaHCO3  Local Anesthesia: 11 cc Lidocaine HCL and NaHCO3 (ratio 2:1) Pulsed mode: 15 watts, 533ms delay, 1.0 duration Total energy: 2021, Total pulses: 137, Total time: 2:15     Stab Phlebectomy: >20 Sites: Thigh and Calf  Patient tolerated procedure well: Yes  Notes:   Description of Procedure:  After marking the course of the secondary varicosities, the patient was placed on the operating table in the supine position, and the right leg was prepped and draped in sterile fashion.   Local anesthetic was administered and under ultrasound guidance the saphenous vein was accessed with a micro needle and guide wire; then the mirco puncture sheath was place.  A guide wire was inserted saphenofemoral junction , followed by a 5 french sheath.  The position of the sheath and then the laser fiber below the junction was confirmed using the ultrasound.  Tumescent anesthesia was administered along the course of the saphenous vein using ultrasound guidance. The patient was placed in Trendelenburg position and protective laser glasses were placed on patient and staff, and the laser was fired at 15 watt pulsed mode advancing 1-2 mm per sec for a total of 2021 joules.   For stab phlebectomies, local anesthetic was administered at the previously marked varicosities, and tumescent anesthesia was administered around the vessels.  Greater than 20 stab wounds were made using the tip of an 11 blade. And using the vein hook, the phlebectomies were performed using a hemostat to avulse the varicosities.   Adequate hemostasis was achieved.     Steri strips were applied to the stab wounds and ABD pads and thigh high compression stockings were applied.  Ace wrap bandages were applied over the phlebectomy sites and at the top of the saphenofemoral junction. Blood loss was less than 15 cc.  The patient ambulated out of the operating room having tolerated the procedure well.

## 2014-05-16 NOTE — Progress Notes (Signed)
Subjective:     Patient ID: Casey Roach, male   DOB: 09/15/1960, 53 y.o.   MRN: [id number]  HPI this 53 year old male had laser ablation right great saphenous vein from the distal 5 to near the saphenofemoral junction plus approximately 30 stab phlebectomy of painful varicosities performed under local tumescent anesthesia. A total of 2021 J of energy was utilized. He tolerated the procedure well   Review of Systems     Objective:   Physical Exam BP 150/94  Pulse 76  Resp 18  Ht 6\' 3"  (1.905 m)  Wt 352 lb (159.666 kg)  BMI 44.00 kg/m2       Assessment:     Well-tolerated laser ablation right great saphenous vein plus greater than 20 stab phlebectomy of painful varicosities performed under local tumescent anesthesia    Plan:     Return in one week for venous duplex exam to confirm closure right great saphenous vein and this will complete patient's treatment regimen

## 2014-05-17 ENCOUNTER — Telehealth: Payer: Self-pay | Admitting: *Deleted

## 2014-05-17 ENCOUNTER — Encounter: Payer: Self-pay | Admitting: Gastroenterology

## 2014-05-17 ENCOUNTER — Encounter: Payer: Self-pay | Admitting: Vascular Surgery

## 2014-05-17 NOTE — Telephone Encounter (Signed)
Pt called me since my calls don't go through at his mother's house. He is doing well. No bleeding from stab sites. Some discomfort at area of laser proced. He's following all instructions. Will see him next Monday for his fu visit.

## 2014-05-18 NOTE — Telephone Encounter (Signed)
Called patient to inform him his sample kit is out front L/M

## 2014-05-20 ENCOUNTER — Encounter: Payer: Self-pay | Admitting: Vascular Surgery

## 2014-05-23 ENCOUNTER — Ambulatory Visit (INDEPENDENT_AMBULATORY_CARE_PROVIDER_SITE_OTHER): Payer: Self-pay | Admitting: Vascular Surgery

## 2014-05-23 ENCOUNTER — Encounter: Payer: Self-pay | Admitting: Vascular Surgery

## 2014-05-23 ENCOUNTER — Ambulatory Visit (HOSPITAL_COMMUNITY)
Admission: RE | Admit: 2014-05-23 | Discharge: 2014-05-23 | Disposition: A | Discharge status: Home / Self Care | Payer: BC Managed Care – PPO | Source: Ambulatory Visit | Attending: Vascular Surgery | Admitting: Vascular Surgery

## 2014-05-23 VITALS — BP 150/91 | HR 89 | Resp 18 | Ht 75.0 in | Wt 350.0 lb

## 2014-05-23 DIAGNOSIS — I83215 Varicose veins of right lower extremity with both ulcer other part of foot and inflammation: Secondary | ICD-10-CM

## 2014-05-23 DIAGNOSIS — I83212 Varicose veins of right lower extremity with both ulcer of calf and inflammation: Secondary | ICD-10-CM

## 2014-05-23 DIAGNOSIS — I8390 Asymptomatic varicose veins of unspecified lower extremity: Secondary | ICD-10-CM | POA: Insufficient documentation

## 2014-05-23 DIAGNOSIS — I83218 Varicose veins of right lower extremity with both ulcer of other part of lower extremity and inflammation: Secondary | ICD-10-CM

## 2014-05-23 DIAGNOSIS — I83219 Varicose veins of right lower extremity with both ulcer of unspecified site and inflammation: Secondary | ICD-10-CM

## 2014-05-23 DIAGNOSIS — I83214 Varicose veins of right lower extremity with both ulcer of heel and midfoot and inflammation: Secondary | ICD-10-CM

## 2014-05-23 DIAGNOSIS — I83211 Varicose veins of right lower extremity with both ulcer of thigh and inflammation: Secondary | ICD-10-CM

## 2014-05-23 DIAGNOSIS — I83812 Varicose veins of left lower extremities with pain: Secondary | ICD-10-CM

## 2014-05-23 DIAGNOSIS — I83213 Varicose veins of right lower extremity with both ulcer of ankle and inflammation: Secondary | ICD-10-CM

## 2014-05-23 DIAGNOSIS — I8391 Asymptomatic varicose veins of right lower extremity: Secondary | ICD-10-CM

## 2014-05-23 NOTE — Progress Notes (Signed)
Subjective:     Patient ID: Casey Roach, male   DOB: 08-26-1960, 53 y.o.   MRN: [id number]  HPI this 53 year old male returns 1 week post laser ablation right great saphenous vein with multiple stab phlebectomy of painful varicosities for chronic swelling and pain due to gross reflux in the right great saphenous system. He has had mild discomfort in the thigh. He states the leg does feel better with less heaviness. He has noticed no change in distal edema. He has been wearing his long elastic compression stocking. He denies chest pain, dyspnea on exertion, PND, orthopnea, and hemoptysis.   Review of Systems     Objective:   Physical Exam BP 150/91  Pulse 89  Resp 18  Ht 6\' 3"  (1.905 m)  Wt 350 lb (158.759 kg)  BMI 43.75 kg/m2  General obese male Right leg with moderate ecchymosis over great saphenous system in the mid and posterior thigh. Stab phlebectomy sites are healing nicely. 1+ chronic edema with hyperpigmentation but no active ulcer.   Today I were to venous duplex exam of the right leg which are reviewed and interpreted. There is total closure of the right great saphenous vein from the distal thigh to near the saphenofemoral junction with no DVT      Assessment:     Successful laser ablation right great saphenous vein and multiple stab phlebectomy of painful varicosities in patient with gross reflux chronic edema and pain    Plan:     Return to see [place] on when necessary basis

## 2014-05-24 ENCOUNTER — Encounter: Payer: BC Managed Care – PPO | Admitting: Gastroenterology

## 2014-05-31 ENCOUNTER — Ambulatory Visit (HOSPITAL_COMMUNITY): Payer: BC Managed Care – PPO | Admitting: Anesthesiology

## 2014-05-31 ENCOUNTER — Encounter (HOSPITAL_COMMUNITY): Payer: BC Managed Care – PPO | Admitting: Anesthesiology

## 2014-05-31 ENCOUNTER — Ambulatory Visit (HOSPITAL_COMMUNITY)
Admission: RE | Admit: 2014-05-31 | Discharge: 2014-05-31 | Disposition: A | Discharge status: Home / Self Care | Payer: BC Managed Care – PPO | Source: Ambulatory Visit | Attending: Gastroenterology | Admitting: Gastroenterology

## 2014-05-31 ENCOUNTER — Encounter (HOSPITAL_COMMUNITY)
Admission: RE | Disposition: A | Discharge status: Home / Self Care | Payer: Self-pay | Source: Ambulatory Visit | Attending: Gastroenterology

## 2014-05-31 ENCOUNTER — Encounter (HOSPITAL_COMMUNITY): Payer: Self-pay | Admitting: *Deleted

## 2014-05-31 DIAGNOSIS — G4730 Sleep apnea, unspecified: Secondary | ICD-10-CM | POA: Diagnosis not present

## 2014-05-31 DIAGNOSIS — Z860100 Personal history of colon polyps, unspecified: Secondary | ICD-10-CM

## 2014-05-31 DIAGNOSIS — Z1211 Encounter for screening for malignant neoplasm of colon: Secondary | ICD-10-CM | POA: Diagnosis not present

## 2014-05-31 DIAGNOSIS — G4733 Obstructive sleep apnea (adult) (pediatric): Secondary | ICD-10-CM | POA: Diagnosis not present

## 2014-05-31 DIAGNOSIS — Z6841 Body Mass Index (BMI) 40.0 and over, adult: Secondary | ICD-10-CM | POA: Diagnosis not present

## 2014-05-31 DIAGNOSIS — D126 Benign neoplasm of colon, unspecified: Secondary | ICD-10-CM

## 2014-05-31 DIAGNOSIS — D123 Benign neoplasm of transverse colon: Secondary | ICD-10-CM

## 2014-05-31 DIAGNOSIS — I10 Essential (primary) hypertension: Secondary | ICD-10-CM | POA: Diagnosis not present

## 2014-05-31 HISTORY — PX: COLONOSCOPY WITH PROPOFOL: SHX5780

## 2014-05-31 SURGERY — COLONOSCOPY WITH PROPOFOL
Anesthesia: Monitor Anesthesia Care

## 2014-05-31 MED ORDER — PROPOFOL 10 MG/ML IV BOLUS
INTRAVENOUS | Status: DC | PRN
  Administered 2014-05-31 (×3): 50 mg via INTRAVENOUS
  Administered 2014-05-31: 25 mg via INTRAVENOUS
  Administered 2014-05-31 (×2): 50 mg via INTRAVENOUS

## 2014-05-31 MED ORDER — LACTATED RINGERS IV SOLN
INTRAVENOUS | Status: DC | PRN
  Administered 2014-05-31: 10:00:00 via INTRAVENOUS

## 2014-05-31 MED ORDER — SODIUM CHLORIDE 0.9 % IV SOLN: INTRAVENOUS | Status: DC

## 2014-05-31 MED ORDER — PROPOFOL 10 MG/ML IV BOLUS
INTRAVENOUS | Status: AC
  Filled 2014-05-31: qty 20

## 2014-05-31 MED ORDER — LACTATED RINGERS IV SOLN
INTRAVENOUS | Status: DC
  Administered 2014-05-31: 1000 mL via INTRAVENOUS

## 2014-05-31 SURGICAL SUPPLY — 21 items

## 2014-05-31 NOTE — Transfer of Care (Signed)
Immediate Anesthesia Transfer of Care Note  Patient: Casey Roach  Procedure(s) Performed: Procedure(s): COLONOSCOPY WITH PROPOFOL (N/A)  Patient Location: PACU  Anesthesia Type:MAC  Level of Consciousness: awake, sedated and patient cooperative  Airway & Oxygen Therapy: Patient Spontanous Breathing and Patient connected to face mask oxygen  Post-op Assessment: Report given to PACU RN and Post -op Vital signs reviewed and stable  Post vital signs: Reviewed and stable  Complications: No apparent anesthesia complications

## 2014-05-31 NOTE — Op Note (Signed)
[facility] [place], [address]   COLONOSCOPY PROCEDURE REPORT     EXAM DATE: 05/31/2014  PATIENT NAME:      Casey, Roach           MR #:      [id number]  BIRTHDATE:       July 29, 1961      VISIT #:     [phone]  ATTENDING:     Inda Castle, MD     STATUS:     outpatient REFERRING MD: ASA CLASS:        Class II  INDICATIONS:  The patient is a 53 yr old male here for a colonoscopy due to high risk personal history of colonic polyps.  2010 PROCEDURE PERFORMED:     Colonoscopy with snare polypectomy MEDICATIONS:     Monitored anesthesia care ESTIMATED BLOOD LOSS:     None  CONSENT: The patient understands the risks and benefits of the procedure and understands that these risks include, but are not limited to: sedation, allergic reaction, infection, perforation and/or bleeding. Alternative means of evaluation and treatment include, among others: physical exam, x-rays, and/or surgical intervention. The patient elects to proceed with this endoscopic procedure.  DESCRIPTION OF PROCEDURE: During intra-op preparation period all mechanical & medical equipment was checked for proper function. Hand hygiene and appropriate measures for infection prevention was taken. After the risks, benefits and alternatives of the procedure were thoroughly explained, Informed consent was verified, confirmed and timeout was successfully executed by the treatment team. A digital exam revealed no abnormalities of the rectum.      The EC-[id number] ([id number]) endoscope was introduced through the anus and advanced to the cecum, which was identified by both the appendix and ileocecal valve. No adverse events experienced. The prep was excellent using Suprep. The instrument was then slowly withdrawn as the colon was fully examined.   COLON FINDINGS: A normal appearing cecum, ileocecal valve, and appendiceal orifice were identified.  The ascending,  transverse, descending, sigmoid colon, and rectum appeared unremarkable.   A flat polyp measuring 3 mm in size was found in the transverse colon.  A polypectomy was performed with a cold snare.  The resection was complete, the polyp tissue was completely retrieved and sent to histology.   The examination was otherwise normal. Retroflexed views revealed no abnormalities.  The scope was then completely withdrawn from the patient and the procedure terminated.  WITHDRAWAL TIME: 8 minutes 0 seconds    ADVERSE EVENTS:      There were no immediate complications.  IMPRESSIONS:     1.  Flat polyp measuring 3 mm in size was found in the transverse colon; polypectomy was performed with a cold snare 2.  The examination was otherwise normal  RECOMMENDATIONS:     If the polyp(s) removed today are proven to be adenomatous (pre-cancerous) polyps, you will need a repeat colonoscopy in 5 years.  Otherwise you should continue to follow colorectal cancer screening guidelines for "routine risk" patients with colonoscopy in 10 years.  You will receive a letter within 1-2 weeks with the results of your biopsy as well as final recommendations.  Please call my office if you have not received a letter after 3 weeks. RECALL:  Inda Castle, MD eSigned:  Inda Castle, MD 05/31/2014 10:59 AM   cc:  Tanda Rockers, MD  CPT CODES: ICD CODES:  The ICD and CPT codes recommended by this software are interpretations from the data  that the clinical staff has captured with the software.  The verification of the translation of this report to the ICD and CPT codes and modifiers is the sole responsibility of the health care institution and practicing physician where this report was generated.  [place]. will not be held responsible for the validity of the ICD and CPT codes included on this report.  AMA assumes no liability for data contained or not contained herein. CPT is a  Designer, television/film set of the [employer].   PATIENT NAME:  Casey, Roach MR#: [id number]

## 2014-05-31 NOTE — Discharge Instructions (Signed)

## 2014-05-31 NOTE — Anesthesia Preprocedure Evaluation (Addendum)
Anesthesia Evaluation  Patient identified by MRN, date of birth, ID band Patient awake    Reviewed: Allergy & Precautions, H&P , NPO status , Patient's Chart, lab work & pertinent test results  Airway Mallampati: II  TM Distance: >3 FB Neck ROM: Full    Dental no notable dental hx.    Pulmonary shortness of breath, sleep apnea ,  breath sounds clear to auscultation  Pulmonary exam normal       Cardiovascular hypertension, Pt. on medications Rhythm:Regular Rate:Normal     Neuro/Psych negative neurological ROS  negative psych ROS   GI/Hepatic negative GI ROS, Neg liver ROS,   Endo/Other  negative endocrine ROS  Renal/GU negative Renal ROS     Musculoskeletal negative musculoskeletal ROS (+)   Abdominal   Peds  Hematology negative hematology ROS (+)   Anesthesia Other Findings   Reproductive/Obstetrics                            Anesthesia Physical Anesthesia Plan  ASA: III  Anesthesia Plan: MAC   Post-op Pain Management:    Induction:   Airway Management Planned:   Additional Equipment:   Intra-op Plan:   Post-operative Plan:   Informed Consent: I have reviewed the patients History and Physical, chart, labs and discussed the procedure including the risks, benefits and alternatives for the proposed anesthesia with the patient or authorized representative who has indicated his/her understanding and acceptance.   Dental advisory given  Plan Discussed with: CRNA  Anesthesia Plan Comments:         Anesthesia Quick Evaluation

## 2014-05-31 NOTE — H&P (Signed)
               _                                                                                                                History of Present Illness:  Mr. Casey Roach is a 53 year old white male with history of an adenomatous colon polyp removed 2010 here for follow-up colonoscopy.  He has no GI complaints.   Past Medical History  Diagnosis Date  . Obesity, unspecified   . Unspecified essential hypertension   . Obstructive sleep apnea (adult) (pediatric)   . Acute bronchitis   . Morbid obesity 06/02/2008    Qualifier: Diagnosis of  By: Julien Girt CMA, Marliss Czar    . Allergy    Past Surgical History  Procedure Laterality Date  . Varicocele excision    . Laser ablation  03/28/2014, 05/16/2014    left leg, right leg   family history is not on file. Current Facility-Administered Medications  Medication Dose Route Frequency Provider Last Rate Last Dose  . 0.9 %  sodium chloride infusion   Intravenous Continuous Inda Castle, MD      . lactated ringers infusion   Intravenous Continuous Inda Castle, MD 10 mL/hr at 05/31/14 0936 1,000 mL at 05/31/14 3428   Facility-Administered Medications Ordered in Other Encounters  Medication Dose Route Frequency Provider Last Rate Last Dose  . lactated ringers infusion    Continuous PRN Bailey Mech, CRNA       Allergies as of 05/10/2014  . (No Known Allergies)    reports that he has never smoked. He has never used smokeless tobacco. He reports that he drinks alcohol. He reports that he does not use illicit drugs.   Review of Systems: Pertinent positive and negative review of systems were noted in the above HPI section. All other review of systems were otherwise negative.  Vital signs were reviewed in today's medical record Physical Exam: General: Well developed , well nourished, no acute distress Skin: anicteric Head: Normocephalic and atraumatic Eyes:  sclerae anicteric, EOMI Ears: Normal auditory acuity Mouth: No deformity or  lesions Neck: Supple, no masses or thyromegaly Lungs: Clear throughout to auscultation Heart: Regular rate and rhythm; no murmurs, rubs or bruits Abdomen: Soft, non tender and non distended. No masses, hepatosplenomegaly or hernias noted. Normal Bowel sounds Rectal:deferred Musculoskeletal: Symmetrical with no gross deformities  Skin: No lesions on visible extremities Pulses:  Normal pulses noted Extremities: No clubbing, cyanosis, edema or deformities noted Neurological: Alert oriented x 4, grossly nonfocal Cervical Nodes:  No significant cervical adenopathy Inguinal Nodes: No significant inguinal adenopathy Psychological:  Alert and cooperative. Normal mood and affect  Impression #1 history of adenomatous colon polyp-2010 #2 sleep apnea  Plan #1 surveillance colonoscopy

## 2014-05-31 NOTE — Anesthesia Postprocedure Evaluation (Signed)
Anesthesia Post Note  Patient: Casey Roach  Procedure(s) Performed: Procedure(s) (LRB): COLONOSCOPY WITH PROPOFOL (N/A)  Anesthesia type: MAC  Patient location: PACU  Post pain: Pain level controlled  Post assessment: Post-op Vital signs reviewed  Last Vitals: BP 140/86  Pulse 65  Temp(Src) 36.5 C (Oral)  Resp 15  SpO2 99%  Post vital signs: Reviewed  Level of consciousness: awake  Complications: No apparent anesthesia complications

## 2014-06-01 ENCOUNTER — Encounter: Payer: Self-pay | Admitting: Gastroenterology

## 2014-06-02 ENCOUNTER — Encounter (HOSPITAL_COMMUNITY): Payer: Self-pay | Admitting: Gastroenterology

## 2014-07-18 ENCOUNTER — Telehealth: Payer: Self-pay | Admitting: Internal Medicine

## 2014-07-18 MED ORDER — BUDESONIDE-FORMOTEROL FUMARATE 160-4.5 MCG/ACT IN AERO: 2.0000 | INHALATION_SPRAY | Freq: Two times a day (BID) | RESPIRATORY_TRACT | Status: DC

## 2014-07-18 NOTE — Telephone Encounter (Signed)
Rx has been sent in. Pt is aware. Nothing further was needed. 

## 2014-07-22 ENCOUNTER — Telehealth: Payer: Self-pay | Admitting: *Deleted

## 2014-07-22 NOTE — Telephone Encounter (Signed)
Spoke with [place]  Initiated PA for Symbicort 145mcg - 2 puff BID Patient ID# [id number]  Unable to complete PA over the phone. Form is being faxed to triage to be completed by Dr Annamaria Boots and then will need to be faxed back. Will send to [facility] to follow up on.

## 2014-07-25 NOTE — Telephone Encounter (Signed)
PA form received and filled out and placed on CY cart to be signed.  Will forward message back to [place] to follow up on.

## 2014-07-26 ENCOUNTER — Other Ambulatory Visit: Payer: Self-pay | Admitting: Internal Medicine

## 2014-07-26 MED ORDER — TEMAZEPAM 15 MG PO CAPS: 15.0000 mg | ORAL_CAPSULE | Freq: Every evening | ORAL | Status: DC | PRN

## 2014-07-27 NOTE — Telephone Encounter (Signed)
Per CY-patient's insurance will cover Breo inhaler and CY would like to have patient change to this instead of Symbicort.   Breo #1 1 puff QD and Rinse mouth well after use with prn refills-will need to confirm what pharmacy to send to to.   LMTCB

## 2014-08-01 ENCOUNTER — Telehealth: Payer: Self-pay | Admitting: *Deleted

## 2014-08-01 NOTE — Telephone Encounter (Signed)
Pt called in wanting to see Dr. Kellie Simmering today to "see what Dr. Kellie Simmering thinks of my progress". There was no availability for Dr. Kellie Simmering to see him today. Asked what his concerns were. He is feeling firm areas around where the stab phlebctomies were performed. I reassured him that these areas will soften up with time and that not enough time has passed since his last procedure. Also answered his questions regarding the procedure itself. Pt expressed understanding. No need for him to be seen today.

## 2014-08-02 ENCOUNTER — Telehealth: Payer: Self-pay | Admitting: Internal Medicine

## 2014-08-02 MED ORDER — FLUTICASONE FUROATE-VILANTEROL 100-25 MCG/INH IN AEPB: 1.0000 | INHALATION_SPRAY | Freq: Every day | RESPIRATORY_TRACT | Status: DC

## 2014-08-02 NOTE — Telephone Encounter (Signed)
Pt returned call - discussed Symbicort vs Breo as stated by Joellen Jersey below Pt stated that with his insurance changing at the [employer], he would like to check to see if his insurance formulary will change to cover the Symbicort in 2016 - he is to call the customer service number on the back his insurance card to check this.  He will call the office to keep [place] updated.  Pt is also aware the Temazepam has been telephoned to his pharmacy already Nothing further needed; will sign off.

## 2014-08-02 NOTE — Telephone Encounter (Signed)
Spoke with the pt and he states that the insurance will cover [facility] and wants this sent to his pharm now  I have sent this in  Nothing further needed

## 2014-08-02 NOTE — Telephone Encounter (Signed)
Pt can be reached @ [phone] SAYS THAT IT IS VERY IMPORTANT.Casey Roach

## 2014-12-01 ENCOUNTER — Ambulatory Visit (INDEPENDENT_AMBULATORY_CARE_PROVIDER_SITE_OTHER): Payer: 59 | Admitting: Internal Medicine

## 2014-12-01 ENCOUNTER — Encounter (INDEPENDENT_AMBULATORY_CARE_PROVIDER_SITE_OTHER): Payer: Self-pay

## 2014-12-01 ENCOUNTER — Encounter: Payer: Self-pay | Admitting: Internal Medicine

## 2014-12-01 VITALS — BP 126/70 | HR 77 | Temp 97.6°F | Ht 75.0 in | Wt 364.8 lb

## 2014-12-01 DIAGNOSIS — G4733 Obstructive sleep apnea (adult) (pediatric): Secondary | ICD-10-CM

## 2014-12-01 MED ORDER — TEMAZEPAM 15 MG PO CAPS: 15.0000 mg | ORAL_CAPSULE | Freq: Every evening | ORAL | Status: DC | PRN

## 2014-12-01 MED ORDER — FLUTICASONE FUROATE-VILANTEROL 100-25 MCG/INH IN AEPB: 1.0000 | INHALATION_SPRAY | Freq: Every day | RESPIRATORY_TRACT | Status: DC

## 2014-12-01 NOTE — Patient Instructions (Signed)
Order- DME [facility]- replacement for old CPAP machine 11 cwp, mask of choice, humidifier, supplies, Airview  Dx OSA  Refill scripts printed for Breo Ellipta and temazepam

## 2014-12-01 NOTE — Progress Notes (Signed)
09/20/11- 51 yoM never smoker followed for OSA, bronchitis, allergic rhinitis (Dr Caprice Red) [place]- 06/29/10 He continues to use CPAP reliably all night every night. Now gets CPAP supplies on line. Temazepam 15 mg for very occasional use only. In the fall of 2011 and 2012 he had sustained bronchitis episodes. In between and ongoing, he describes a persistent dry cough with a little chest tightness. He denies reflux and has no history of recognized asthma. We discussed lisinopril/ACE inhibitor cough. He says last prior chest x-ray was "less than ideal". By his description I think he was referring to technical issues rather than a specific finding, but he asks repeat chest x-ray.  09/21/12- 51 yoM never smoker followed for OSA, bronchitis, allergic rhinitis (Dr Orvil Feil) FOLLOWS FOR: wears CPAP 11 every night for about 7 hours and pressure working well for patient; has had to use inhaler more than usual-after eating has noticed chest tightness and having to use inhaler then. Slight increase in wheezing(per patient this is rare for him to have). He gets his own CPAP suppies on line with no DME supplier. CXR 09/20/11-  IMPRESSION:  No active lung disease. Slight hyperaeration.  Original Report Authenticated By: Joretta Bachelor, M.D.  11/30/13- 28 yoM never smoker followed for OSA, bronchitis, allergic rhinitis (Dr Orvil Feil) FOLLOWS FOR:  Wearing CPAP 11/ On line supplier 8 hours per night.  Did not have insurance for some time and does again; would like to get set back up with [facility] needs new supplies  12/01/14- 54 yoM never smoker followed for OSA, bronchitis, allergic rhinitis (Dr Orvil Feil) FOLLOWS FOR: Wears CPAP 11/ [facility] every night for about 7-8 hours; pressure seems to be okay. Not too happy with mask. DME is [facility]-will need order for different mask and would like to try for machine as well([facility] told him they could get a new machine for him as his is 54 years old).No DL-pt states he is unsure is CPAP has  card or able to get downloads.    ROS-see HPI Constitutional:   No-   weight loss, night sweats, fevers, chills, fatigue, lassitude. HEENT:   No-  headaches, difficulty swallowing, tooth/dental problems, sore throat,       No-  sneezing, itching, ear ache, nasal congestion, post nasal drip,  CV:  No-   chest pain, orthopnea, PND, swelling in lower extremities, anasarca, dizziness, palpitations Resp: No-  acute shortness of breath with exertion or at rest.              No-   productive cough,  + non-productive cough,  No- coughing up of blood.              No-   change in color of mucus.  No- wheezing.   Skin: No-   rash or lesions. GI:  No-   heartburn, indigestion, abdominal pain, nausea, vomiting, GU:  MS:  No-   joint pain or swelling.  Neuro-     nothing unusual Psych:  No- change in mood or affect. No depression or anxiety.  No memory loss.  OBJ- Physical Exam General- Alert, Oriented, Affect-appropriate, Distress- none acute, morbidly obese Skin- rash-none, lesions- none, excoriation- none Lymphadenopathy- none Head- atraumatic            Eyes- Gross vision intact, PERRLA, conjunctivae and secretions clear, ? proptosis            Ears- Hearing, canals-normal            Nose- Clear, no-Septal dev,  mucus, polyps, erosion, perforation             Throat- Mallampati III , mucosa clear , drainage- none, tonsils- atrophic Neck- flexible , trachea midline, no stridor , thyroid nl, carotid no bruit Chest - symmetrical excursion , unlabored           Heart/CV- RRR , no murmur , no gallop  , no rub, nl s1 s2                           - JVD- none , edema- none, stasis changes- none, varices- none           Lung- clear to P&A, wheeze- none, cough- none , dullness-none, rub- none           Chest wall-  Abd-  Br/ Gen/ Rectal- Not done, not indicated Extrem- cyanosis- none, clubbing, none, atrophy- none, strength- nl Neuro- grossly intact to observation

## 2015-03-27 ENCOUNTER — Other Ambulatory Visit: Payer: Self-pay | Admitting: Internal Medicine

## 2015-06-22 ENCOUNTER — Other Ambulatory Visit: Payer: Self-pay | Admitting: Internal Medicine

## 2015-06-26 ENCOUNTER — Other Ambulatory Visit: Payer: Self-pay | Admitting: Internal Medicine

## 2015-07-18 ENCOUNTER — Telehealth: Payer: Self-pay | Admitting: Internal Medicine

## 2015-07-18 ENCOUNTER — Other Ambulatory Visit: Payer: Self-pay

## 2015-07-18 ENCOUNTER — Other Ambulatory Visit: Payer: Self-pay | Admitting: Internal Medicine

## 2015-07-18 MED ORDER — TEMAZEPAM 15 MG PO CAPS: 15.0000 mg | ORAL_CAPSULE | Freq: Every evening | ORAL | Status: DC | PRN

## 2015-07-18 NOTE — Telephone Encounter (Signed)
CY approved rx this time + 5 additional. Faxed signed request to pharm.

## 2015-07-18 NOTE — Telephone Encounter (Signed)
Left message for pt to call back  °

## 2015-07-19 ENCOUNTER — Encounter (INDEPENDENT_AMBULATORY_CARE_PROVIDER_SITE_OTHER): Payer: 59

## 2015-07-19 DIAGNOSIS — I8391 Asymptomatic varicose veins of right lower extremity: Secondary | ICD-10-CM

## 2015-07-19 NOTE — Telephone Encounter (Signed)
Patient Returned call  [phone]

## 2015-07-19 NOTE — Telephone Encounter (Signed)
Attempted to contact pt. Line was busy. Will try back later.

## 2015-07-19 NOTE — Telephone Encounter (Signed)
lmtcb for pt.  

## 2015-07-19 NOTE — Telephone Encounter (Signed)
Patient Returned call [phone]

## 2015-07-19 NOTE — Telephone Encounter (Signed)
lmomtcb x2 for pt 

## 2015-07-20 NOTE — Telephone Encounter (Signed)
[phone], pt cb

## 2015-07-20 NOTE — Telephone Encounter (Signed)
Spoke with pt. States that [facility] is needing some information from [place]. He was not clear about what is needed. lmtcb x1 for Melissa with [facility].

## 2015-07-21 NOTE — Telephone Encounter (Signed)
[phone], pt cb

## 2015-07-21 NOTE — Telephone Encounter (Signed)
Spoke with Lenna Sciara, states she will look into what was being requested and will call back.  Will await call .

## 2015-07-21 NOTE — Telephone Encounter (Signed)
lmtcb x2 for Melissa. 

## 2015-07-25 ENCOUNTER — Telehealth: Payer: Self-pay | Admitting: Internal Medicine

## 2015-07-25 NOTE — Telephone Encounter (Signed)
Called patient and advised him that we have left a message for Melissa at [facility] and will let him know once we have heard back from Vincent. Spoke with Melissa, she said that patient may need a 2nd OV since he received a new machine.  She said she will check with the Respiratory dept to see if he will need another appointment and she will call us back to let us know. Awaiting call back from [facility]. Patient states that we can leave a detailed message on his voicemail if he does not answer.

## 2015-07-25 NOTE — Telephone Encounter (Signed)
Melissa calling back.  [phone]

## 2015-07-25 NOTE — Telephone Encounter (Signed)
Called spoke with pt. appt scheduled 12/22 with Dr. Annamaria Boots. Nothing further needed

## 2015-07-25 NOTE — Telephone Encounter (Signed)
Pt is calling back about his cpap [phone]

## 2015-07-25 NOTE — Telephone Encounter (Signed)
Left message for patient to call back  

## 2015-07-26 NOTE — Telephone Encounter (Signed)
Spoke with Casey Roach and confirmed ov needed before they can provide any CPAP supplies  Pt has ov 07/27/15 with [facility]  I called and left him a detailed msg explaining he needs to keep this appt so we can document the necessary information needed to get CPAP supplies

## 2015-07-27 ENCOUNTER — Ambulatory Visit: Payer: 59 | Admitting: Internal Medicine

## 2015-07-27 ENCOUNTER — Encounter: Payer: Self-pay | Admitting: Internal Medicine

## 2015-07-27 VITALS — BP 126/80 | HR 91 | Ht 75.0 in | Wt 357.0 lb

## 2015-07-27 DIAGNOSIS — G4733 Obstructive sleep apnea (adult) (pediatric): Secondary | ICD-10-CM

## 2015-07-27 NOTE — Patient Instructions (Signed)
Order- DME [facility] replacement CPAP mask of choice, supplies/ headgear      Dx OSA   You are doing very well with CPAP 11/ [facility]. No changes needed. Glad it helps.

## 2015-07-27 NOTE — Progress Notes (Signed)
09/20/11- 51 yoM never smoker followed for OSA, bronchitis, allergic rhinitis (Dr Caprice Red) [place]- 06/29/10 He continues to use CPAP reliably all night every night. Now gets CPAP supplies on line. Temazepam 15 mg for very occasional use only. In the fall of 2011 and 2012 he had sustained bronchitis episodes. In between and ongoing, he describes a persistent dry cough with a little chest tightness. He denies reflux and has no history of recognized asthma. We discussed lisinopril/ACE inhibitor cough. He says last prior chest x-ray was "less than ideal". By his description I think he was referring to technical issues rather than a specific finding, but he asks repeat chest x-ray.  09/21/12- 51 yoM never smoker followed for OSA, bronchitis, allergic rhinitis (Dr Orvil Feil) FOLLOWS FOR: wears CPAP 11 every night for about 7 hours and pressure working well for patient; has had to use inhaler more than usual-after eating has noticed chest tightness and having to use inhaler then. Slight increase in wheezing(per patient this is rare for him to have). He gets his own CPAP suppies on line with no DME supplier. CXR 09/20/11-  IMPRESSION:  No active lung disease. Slight hyperaeration.  Original Report Authenticated By: Joretta Bachelor, M.D.  11/30/13- 82 yoM never smoker followed for OSA, bronchitis, allergic rhinitis (Dr Orvil Feil) FOLLOWS FOR:  Wearing CPAP 11/ On line supplier 8 hours per night.  Did not have insurance for some time and does again; would like to get set back up with [facility] needs new supplies  12/01/14- 54 yoM never smoker followed for OSA, bronchitis, allergic rhinitis (Dr Orvil Feil) FOLLOWS FOR: Wears CPAP 11/ [facility] every night for about 7-8 hours; pressure seems to be okay. Not too happy with mask. DME is [facility]-will need order for different mask and would like to try for machine as well([facility] told him they could get a new machine for him as his is 54 years old).No DL-pt states he is unsure is CPAP has  card or able to get downloads.   07/27/2015-54 year old male never smoker followed for OSA, bronchitis, complicated by allergic rhinitis (Dr Orvil Feil) FOLLOWS FOR: Visit today per [facility]- needs documented compliance. Wears CPAP nightly x 6-7 hrs. Denies any issues with mask or pressure setting. Needs order for new CPAP supplies/headgear/pillows. CPAP 11/[facility]    ROS-see HPI Constitutional:   No-   weight loss, night sweats, fevers, chills, fatigue, lassitude. HEENT:   No-  headaches, difficulty swallowing, tooth/dental problems, sore throat,       No-  sneezing, itching, ear ache, nasal congestion, post nasal drip,  CV:  No-   chest pain, orthopnea, PND, swelling in lower extremities, anasarca, dizziness, palpitations Resp: No-  acute shortness of breath with exertion or at rest.              No-   productive cough,  + non-productive cough,  No- coughing up of blood.              No-   change in color of mucus.  No- wheezing.   Skin: No-   rash or lesions. GI:  No-   heartburn, indigestion, abdominal pain, nausea, vomiting, GU:  MS:  No-   joint pain or swelling.  Neuro-     nothing unusual Psych:  No- change in mood or affect. No depression or anxiety.  No memory loss.  OBJ- Physical Exam General- Alert, Oriented, Affect-appropriate, Distress- none acute, morbidly obese Skin- rash-none, lesions- none, excoriation- none Lymphadenopathy- none Head- atraumatic  Eyes- Gross vision intact, PERRLA, conjunctivae and secretions clear, ? proptosis            Ears- Hearing, canals-normal            Nose- Clear, no-Septal dev, mucus, polyps, erosion, perforation             Throat- Mallampati III , mucosa clear , drainage- none, tonsils- atrophic Neck- flexible , trachea midline, no stridor , thyroid nl, carotid no bruit Chest - symmetrical excursion , unlabored           Heart/CV- RRR , no murmur , no gallop  , no rub, nl s1 s2                           - JVD- none , edema- none,  stasis changes- none, varices- none           Lung- clear to P&A, wheeze- none, cough- none , dullness-none, rub- none           Chest wall-  Abd-  Br/ Gen/ Rectal- Not done, not indicated Extrem- cyanosis- none, clubbing, none, atrophy- none, strength- nl Neuro- grossly intact to observation

## 2015-11-07 DIAGNOSIS — J301 Allergic rhinitis due to pollen: Secondary | ICD-10-CM | POA: Diagnosis not present

## 2015-11-07 DIAGNOSIS — J3089 Other allergic rhinitis: Secondary | ICD-10-CM | POA: Diagnosis not present

## 2015-11-22 DIAGNOSIS — J3089 Other allergic rhinitis: Secondary | ICD-10-CM | POA: Diagnosis not present

## 2015-11-22 DIAGNOSIS — J301 Allergic rhinitis due to pollen: Secondary | ICD-10-CM | POA: Diagnosis not present

## 2015-11-23 DIAGNOSIS — E291 Testicular hypofunction: Secondary | ICD-10-CM | POA: Diagnosis not present

## 2015-12-01 ENCOUNTER — Encounter: Payer: Self-pay | Admitting: Internal Medicine

## 2015-12-01 ENCOUNTER — Ambulatory Visit (INDEPENDENT_AMBULATORY_CARE_PROVIDER_SITE_OTHER): Payer: BLUE CROSS/BLUE SHIELD | Admitting: Internal Medicine

## 2015-12-01 VITALS — BP 126/80 | HR 81 | Ht 75.0 in | Wt 360.8 lb

## 2015-12-01 DIAGNOSIS — G4733 Obstructive sleep apnea (adult) (pediatric): Secondary | ICD-10-CM | POA: Diagnosis not present

## 2015-12-01 DIAGNOSIS — G4700 Insomnia, unspecified: Secondary | ICD-10-CM

## 2015-12-01 MED ORDER — TEMAZEPAM 15 MG PO CAPS: 15.0000 mg | ORAL_CAPSULE | Freq: Every evening | ORAL | Status: DC | PRN

## 2015-12-01 NOTE — Progress Notes (Signed)
09/20/11- 51 yoM never smoker followed for OSA, bronchitis, allergic rhinitis (Dr Caprice Red) [place]- 06/29/10 He continues to use CPAP reliably all night every night. Now gets CPAP supplies on line. Temazepam 15 mg for very occasional use only. In the fall of 2011 and 2012 he had sustained bronchitis episodes. In between and ongoing, he describes a persistent dry cough with a little chest tightness. He denies reflux and has no history of recognized asthma. We discussed lisinopril/ACE inhibitor cough. He says last prior chest x-ray was "less than ideal". By his description I think he was referring to technical issues rather than a specific finding, but he asks repeat chest x-ray.  09/21/12- 51 yoM never smoker followed for OSA, bronchitis, allergic rhinitis (Dr Orvil Feil) FOLLOWS FOR: wears CPAP 11 every night for about 7 hours and pressure working well for patient; has had to use inhaler more than usual-after eating has noticed chest tightness and having to use inhaler then. Slight increase in wheezing(per patient this is rare for him to have). He gets his own CPAP suppies on line with no DME supplier. CXR 09/20/11-  IMPRESSION:  No active lung disease. Slight hyperaeration.  Original Report Authenticated By: Joretta Bachelor, M.D.  11/30/13- 33 yoM never smoker followed for OSA, bronchitis, allergic rhinitis (Dr Orvil Feil) FOLLOWS FOR:  Wearing CPAP 11/ On line supplier 8 hours per night.  Did not have insurance for some time and does again; would like to get set back up with [facility] needs new supplies  12/01/14- 54 yoM never smoker followed for OSA, bronchitis, allergic rhinitis (Dr Orvil Feil) FOLLOWS FOR: Wears CPAP 11/ [facility] every night for about 7-8 hours; pressure seems to be okay. Not too happy with mask. DME is [facility]-will need order for different mask and would like to try for machine as well([facility] told him they could get a new machine for him as his is 54 years old).No DL-pt states he is unsure is CPAP has  card or able to get downloads.   07/27/2015-55 year old male never smoker followed for OSA, bronchitis, complicated by allergic rhinitis (Dr Orvil Feil) FOLLOWS FOR: Visit today per [facility]- needs documented compliance. Wears CPAP nightly x 6-7 hrs. Denies any issues with mask or pressure setting. Needs order for new CPAP supplies/headgear/pillows. CPAP 11/[facility]  12/01/2015-55 year old male never smoker followed for OSA, bronchitis, complicated by allergic rhinitis( Dr Orvil Feil) CPAP 11/Advanced FOLLOWS FOR: DME [facility]-pt wears CPAP every night, pressure working well for patient but has noticed that he is having issues with his Valerie Salts will go by [facility] to get new one. Nasal pillows mask is working well for him but needs replacement. I suggested he show it to his DME company in person so they can recommend mask fit adjustments if needed. Sometimes pressure seems to high. He explains His Compliance Report by Saying Occasionally uses an older machine which doesn't record. Rare temazepam.  ROS-see HPI Constitutional:   No-   weight loss, night sweats, fevers, chills, fatigue, lassitude. HEENT:   No-  headaches, difficulty swallowing, tooth/dental problems, sore throat,       No-  sneezing, itching, ear ache, nasal congestion, post nasal drip,  CV:  No-   chest pain, orthopnea, PND, swelling in lower extremities, anasarca, dizziness, palpitations Resp: No-  acute shortness of breath with exertion or at rest.              No-   productive cough,  + non-productive cough,  No- coughing up of blood.  No-   change in color of mucus.  No- wheezing.   Skin: No-   rash or lesions. GI:  No-   heartburn, indigestion, abdominal pain, nausea, vomiting, GU:  MS:  No-   joint pain or swelling.  Neuro-     nothing unusual Psych:  No- change in mood or affect. No depression or anxiety.  No memory loss.  OBJ- Physical Exam General- Alert, Oriented, Affect-appropriate, Distress- none acute, morbidly  obese Skin- rash-none, lesions- none, excoriation- none Lymphadenopathy- none Head- atraumatic            Eyes- Gross vision intact, PERRLA, conjunctivae and secretions clear, ? proptosis            Ears- Hearing, canals-normal            Nose- Clear, no-Septal dev, mucus, polyps, erosion, perforation             Throat- Mallampati III , mucosa clear , drainage- none, tonsils- atrophic Neck- flexible , trachea midline, no stridor , thyroid nl, carotid no bruit Chest - symmetrical excursion , unlabored           Heart/CV- RRR , no murmur , no gallop  , no rub, nl s1 s2                           - JVD- none , edema- none, stasis changes- none, varices- none           Lung- clear to P&A, wheeze- none, cough- none , dullness-none, rub- none           Chest wall-  Abd-  Br/ Gen/ Rectal- Not done, not indicated Extrem- cyanosis- none, clubbing, none, atrophy- none, strength- nl Neuro- grossly intact to observation

## 2015-12-01 NOTE — Patient Instructions (Addendum)
Order DME [facility]- reduce CPAP to 10, continue mask of choice, humidifier, supplies, AirView  Dx OSA  Please call if we can help

## 2015-12-03 DIAGNOSIS — G4700 Insomnia, unspecified: Secondary | ICD-10-CM | POA: Insufficient documentation

## 2015-12-03 NOTE — Assessment & Plan Note (Signed)
Occasional situational insomnia. Plan-sleep hygiene. Continues CPAP. Occasional use of temazepam as needed.

## 2015-12-03 NOTE — Assessment & Plan Note (Signed)
Download shows adequate compliance and great control at CPAP 11. There is room to reduce the pressure a little. Plan-reduce CPAP pressure to 10. He will get replacement face mask.

## 2015-12-03 NOTE — Assessment & Plan Note (Signed)
He has not made the lifestyle changes needed to sustain weight loss.

## 2015-12-07 ENCOUNTER — Encounter: Payer: Self-pay | Admitting: Internal Medicine

## 2015-12-15 DIAGNOSIS — J3089 Other allergic rhinitis: Secondary | ICD-10-CM | POA: Diagnosis not present

## 2015-12-15 DIAGNOSIS — J301 Allergic rhinitis due to pollen: Secondary | ICD-10-CM | POA: Diagnosis not present

## 2015-12-21 DIAGNOSIS — E291 Testicular hypofunction: Secondary | ICD-10-CM | POA: Diagnosis not present

## 2015-12-26 DIAGNOSIS — M19012 Primary osteoarthritis, left shoulder: Secondary | ICD-10-CM | POA: Diagnosis not present

## 2015-12-28 DIAGNOSIS — M546 Pain in thoracic spine: Secondary | ICD-10-CM | POA: Diagnosis not present

## 2015-12-28 DIAGNOSIS — M542 Cervicalgia: Secondary | ICD-10-CM | POA: Diagnosis not present

## 2015-12-29 DIAGNOSIS — G4733 Obstructive sleep apnea (adult) (pediatric): Secondary | ICD-10-CM | POA: Diagnosis not present

## 2016-01-08 DIAGNOSIS — J3089 Other allergic rhinitis: Secondary | ICD-10-CM | POA: Diagnosis not present

## 2016-01-08 DIAGNOSIS — J301 Allergic rhinitis due to pollen: Secondary | ICD-10-CM | POA: Diagnosis not present

## 2016-01-10 ENCOUNTER — Other Ambulatory Visit: Payer: Self-pay | Admitting: *Deleted

## 2016-01-10 DIAGNOSIS — I83811 Varicose veins of right lower extremities with pain: Secondary | ICD-10-CM

## 2016-01-11 ENCOUNTER — Ambulatory Visit (HOSPITAL_COMMUNITY)
Admission: RE | Admit: 2016-01-11 | Discharge: 2016-01-11 | Disposition: A | Discharge status: Home / Self Care | Payer: BLUE CROSS/BLUE SHIELD | Source: Ambulatory Visit | Attending: Vascular Surgery | Admitting: Vascular Surgery

## 2016-01-11 DIAGNOSIS — I83811 Varicose veins of right lower extremities with pain: Secondary | ICD-10-CM

## 2016-01-12 ENCOUNTER — Encounter: Payer: Self-pay | Admitting: Vascular Surgery

## 2016-01-16 ENCOUNTER — Ambulatory Visit (INDEPENDENT_AMBULATORY_CARE_PROVIDER_SITE_OTHER): Payer: BLUE CROSS/BLUE SHIELD | Admitting: Vascular Surgery

## 2016-01-16 ENCOUNTER — Encounter: Payer: Self-pay | Admitting: Vascular Surgery

## 2016-01-16 VITALS — BP 140/94 | HR 86 | Temp 98.0°F | Resp 16 | Ht 75.0 in | Wt 352.0 lb

## 2016-01-16 DIAGNOSIS — Z6841 Body Mass Index (BMI) 40.0 and over, adult: Secondary | ICD-10-CM | POA: Diagnosis not present

## 2016-01-16 DIAGNOSIS — M79671 Pain in right foot: Secondary | ICD-10-CM | POA: Diagnosis not present

## 2016-01-16 DIAGNOSIS — R7302 Impaired glucose tolerance (oral): Secondary | ICD-10-CM | POA: Diagnosis not present

## 2016-01-16 DIAGNOSIS — R109 Unspecified abdominal pain: Secondary | ICD-10-CM | POA: Diagnosis not present

## 2016-01-16 DIAGNOSIS — I83893 Varicose veins of bilateral lower extremities with other complications: Secondary | ICD-10-CM | POA: Insufficient documentation

## 2016-01-16 DIAGNOSIS — D751 Secondary polycythemia: Secondary | ICD-10-CM | POA: Diagnosis not present

## 2016-01-16 DIAGNOSIS — G4730 Sleep apnea, unspecified: Secondary | ICD-10-CM | POA: Diagnosis not present

## 2016-01-16 NOTE — Progress Notes (Signed)
Subjective:     Patient ID: Casey Roach, male   DOB: [birth year], 55 y.o.   MRN: [id number]  HPI this 55 year old male returns for continued follow-up regarding his bilateral severe venous disease. He previously underwent laser ablation of the right great and the left great saphenous veins. He had a history of recurrent stasis ulcers in the left ankle and he has had no recurrent ulcers since the procedure. He also had multiple stab phlebectomy of painful varicosities. He developed some discomfort in the right foot recently was concerned about a DVT and has developed multiple more bulging painful varicosities in the left thigh which concerns him. He has worn long leg elastic compression stockings 20-30 millimeter gradient and tried elevation and ibuprofen but continues to have increasing aching throbbing discomfort in the left thigh and calf area with some worsening of the edema. He has had no recurrent ulcers. Right leg began aching on the dorsum of the foot about 3 weeks ago and that has improved and he was concerned about a DVT.  Past Medical History  Diagnosis Date  . Obesity, unspecified   . Unspecified essential hypertension   . Obstructive sleep apnea (adult) (pediatric)   . Acute bronchitis   . Morbid obesity ([place]) 06/02/2008    Qualifier: Diagnosis of  By: Julien Girt CMA, Leigh    . Allergy     Social History  Substance Use Topics  . Smoking status: Never Smoker   . Smokeless tobacco: Never Used  . Alcohol Use: Yes     Comment: rarely    History reviewed. No pertinent family history.  No Known Allergies   Current outpatient prescriptions:  .  cholecalciferol (VITAMIN D) 1000 UNITS tablet, Take 1,000 Units by mouth daily., Disp: , Rfl:  .  fluticasone (FLONASE) 50 MCG/ACT nasal spray, Place 2 sprays into the nose daily as needed for allergies or rhinitis. , Disp: , Rfl:  .  Fluticasone Furoate-Vilanterol (BREO ELLIPTA) 100-25 MCG/INH AEPB, Inhale 1 puff into the lungs daily.  Rinse mouth, Disp: 60 each, Rfl: 11 .  Ibuprofen (ADVIL) 200 MG CAPS, Take 400 mg by mouth once as needed (pain.)., Disp: , Rfl:  .  lisinopril-hydrochlorothiazide (PRINZIDE,ZESTORETIC) 20-12.5 MG per tablet, Take 1 tablet by mouth every morning. , Disp: , Rfl:  .  loratadine (CLARITIN) 10 MG tablet, Take 10 mg by mouth as needed. , Disp: , Rfl:  .  loratadine-pseudoephedrine (CLARITIN-D 12-HOUR) 5-120 MG per tablet, Take 1 tablet by mouth daily as needed., Disp: , Rfl:  .  Naproxen Sodium (ALEVE PO), Take 2 capsules by mouth once as needed (pain). , Disp: , Rfl:  .  NON FORMULARY, Allergy Vaccine -Meg Whelan/twice a month per pt, Disp: , Rfl:  .  NON FORMULARY, CPAP 11, Disp: , Rfl:  .  temazepam (RESTORIL) 15 MG capsule, Take 1 capsule (15 mg total) by mouth at bedtime as needed for sleep., Disp: 30 capsule, Rfl: 5 .  testosterone cypionate (DEPOTESTOSTERONE CYPIONATE) 200 MG/ML injection, Inject 200 mg into the muscle every 21 ( twenty-one) days., Disp: , Rfl:  .  VENTOLIN HFA 108 (90 BASE) MCG/ACT inhaler, USE 2 PUFFS EVERY 4-6 HOURS OR AS DIRECTED FOR ASTHMA RESCUE., Disp: 18 g, Rfl: 0 .  vitamin C (ASCORBIC ACID) 500 MG tablet, Take 500 mg by mouth once as needed. , Disp: , Rfl:   Filed Vitals:   01/16/16 1403  BP: 140/94  Pulse: 86  Temp: 98 F (36.7 C)  Resp: 16  Height: 6\' 3"  (1.905 m)  Weight: 352 lb (159.666 kg)  SpO2: 100%    Body mass index is 44 kg/(m^2).           Review of Systems eyes chest pain but does have dyspnea on exertion and sleep apnea denies hemoptysis or claudication     Objective:   Physical Exam BP 140/94 mmHg  Pulse 86  Temp(Src) 98 F (36.7 C)  Resp 16  Ht 6\' 3"  (1.905 m)  Wt 352 lb (159.666 kg)  BMI 44.00 kg/m2  SpO2 100%    Gen.-alert and oriented x3 in no apparent distress-morbidly obese HEENT normal for age Lungs no rhonchi or wheezing Cardiovascular regular rhythm no murmurs carotid pulses 3+ palpable no bruits  audible Abdomen soft nontender no palpable masses-obese Musculoskeletal free of  major deformities Skin clear -no rashes Neurologic normal Lower extremities 3+ femoral and dorsalis pedis pulses palpable bilaterally with 1+ edema bilaterally, hyperpigmentation lower third left greater than right with no active ulcer Bulging varicosities throughout the entire left anterior medial thigh extending down into the left medial calf A few bulging varicosities on the right side the medial thigh and calf but not as extensive  Venous duplex exam today revealed no DVT in the right lower extremity. Venous duplex exam of the left leg reveals the left anterior accessory branch of the great saphenous vein to be large with gross reflux supplying these recurrent bulging varicosities. The left great saphenous vein is totally closed.       Assessment:     Recurrent painful varicosities left thigh with chronic edema. This is secondary to gross reflux and large left great saphenous vein-anterior chest root branch which was noted at the time of the original ablation in 2015 but only the left great saphenous vein branch was closed at that time No evidence of DVT right leg    Plan:     Patient needs laser ablation of left great saphenous vein-anterior accessory branch which is supplying these symptomatic painful bulging varicosities. It is large and has gross reflux.  Will proceed with precertification since patient has been on conservative measures for the last year including long-leg elastic compression stockings 20-30 millimeter gradient, elevation, and ibuprofen and this has not prevented further progression of these recurrent varicosities in the left leg Patient needs laser ablation left great saphenous vein-anterior accessory branch plus greater than 20 stab phlebectomy painful varicosities

## 2016-01-17 ENCOUNTER — Other Ambulatory Visit: Payer: Self-pay | Admitting: Internal Medicine

## 2016-01-17 DIAGNOSIS — R109 Unspecified abdominal pain: Secondary | ICD-10-CM

## 2016-01-18 DIAGNOSIS — E291 Testicular hypofunction: Secondary | ICD-10-CM | POA: Diagnosis not present

## 2016-01-22 ENCOUNTER — Other Ambulatory Visit: Payer: Self-pay | Admitting: Internal Medicine

## 2016-01-23 DIAGNOSIS — J3089 Other allergic rhinitis: Secondary | ICD-10-CM | POA: Diagnosis not present

## 2016-01-23 DIAGNOSIS — J301 Allergic rhinitis due to pollen: Secondary | ICD-10-CM | POA: Diagnosis not present

## 2016-01-29 ENCOUNTER — Encounter (INDEPENDENT_AMBULATORY_CARE_PROVIDER_SITE_OTHER): Payer: BLUE CROSS/BLUE SHIELD

## 2016-01-29 ENCOUNTER — Ambulatory Visit
Admission: RE | Admit: 2016-01-29 | Discharge: 2016-01-29 | Disposition: A | Discharge status: Home / Self Care | Payer: BLUE CROSS/BLUE SHIELD | Source: Ambulatory Visit | Attending: Internal Medicine | Admitting: Internal Medicine

## 2016-01-29 ENCOUNTER — Encounter (HOSPITAL_COMMUNITY): Payer: BLUE CROSS/BLUE SHIELD

## 2016-01-29 DIAGNOSIS — I868 Varicose veins of other specified sites: Secondary | ICD-10-CM

## 2016-01-29 DIAGNOSIS — N281 Cyst of kidney, acquired: Secondary | ICD-10-CM | POA: Diagnosis not present

## 2016-01-29 DIAGNOSIS — R109 Unspecified abdominal pain: Secondary | ICD-10-CM

## 2016-01-30 ENCOUNTER — Ambulatory Visit: Payer: BLUE CROSS/BLUE SHIELD | Admitting: Vascular Surgery

## 2016-02-08 ENCOUNTER — Other Ambulatory Visit: Payer: Self-pay | Admitting: *Deleted

## 2016-02-08 DIAGNOSIS — I83892 Varicose veins of left lower extremities with other complications: Secondary | ICD-10-CM

## 2016-02-12 DIAGNOSIS — J3089 Other allergic rhinitis: Secondary | ICD-10-CM | POA: Diagnosis not present

## 2016-02-12 DIAGNOSIS — J301 Allergic rhinitis due to pollen: Secondary | ICD-10-CM | POA: Diagnosis not present

## 2016-02-13 ENCOUNTER — Ambulatory Visit (HOSPITAL_COMMUNITY)
Admission: RE | Admit: 2016-02-13 | Discharge: 2016-02-13 | Disposition: A | Discharge status: Home / Self Care | Payer: BLUE CROSS/BLUE SHIELD | Source: Ambulatory Visit | Attending: Vascular Surgery | Admitting: Vascular Surgery

## 2016-02-13 DIAGNOSIS — I83892 Varicose veins of left lower extremities with other complications: Secondary | ICD-10-CM | POA: Diagnosis not present

## 2016-02-15 ENCOUNTER — Encounter: Payer: Self-pay | Admitting: Vascular Surgery

## 2016-02-20 ENCOUNTER — Ambulatory Visit (INDEPENDENT_AMBULATORY_CARE_PROVIDER_SITE_OTHER): Payer: BLUE CROSS/BLUE SHIELD | Admitting: Vascular Surgery

## 2016-02-20 VITALS — BP 144/86 | HR 79 | Temp 97.5°F | Resp 18 | Ht 75.0 in | Wt 345.0 lb

## 2016-02-20 DIAGNOSIS — I83892 Varicose veins of left lower extremities with other complications: Secondary | ICD-10-CM | POA: Insufficient documentation

## 2016-02-20 NOTE — Progress Notes (Signed)
Filed Vitals:   02/20/16 0932 02/20/16 0936  BP: 145/91 144/86  Pulse: 79 79  Temp: 97.5 F (36.4 C)   Resp: 18   Height: 6\' 3"  (1.905 m)   Weight: 345 lb (156.491 kg)   SpO2: 96%

## 2016-02-20 NOTE — Progress Notes (Signed)
Subjective:     Patient ID: Casey Roach, male   DOB: [birth year], 55 y.o.   MRN: [id number]  HPI this 55 year old male returns today for further discussion regarding his painful varicosities in the left leg. Patient has a history of recurrent venous stasis ulcers and underwent laser ablation left great saphenous vein in 2015 with successful closure. Patient was known at that time to have gross reflux in the anterior accessory branch which was not closed. Patient had good healing of his stasis ulcers and they have not recurred. He has however developed painful recurrent varicosities in the thigh and calf which are quite uncomfortable and worsening of the left lower extremity edema. He wears long leg elastic compression stockings 20-30 millimeter gradient and has done so for the past 24 months without fail. His symptoms continued to worsen on are affecting his daily living. He returns today for follow-up with discussion of the findings of the venous reflux study of the left leg. He's had no DVT in the left leg.  Past Medical History  Diagnosis Date  . Obesity, unspecified   . Unspecified essential hypertension   . Obstructive sleep apnea (adult) (pediatric)   . Acute bronchitis   . Morbid obesity ([place]) 06/02/2008    Qualifier: Diagnosis of  By: Julien Girt CMA, Leigh    . Allergy     Social History  Substance Use Topics  . Smoking status: Never Smoker   . Smokeless tobacco: Never Used  . Alcohol Use: Yes     Comment: rarely    No family history on file.  No Known Allergies   Current outpatient prescriptions:  .  cholecalciferol (VITAMIN D) 1000 UNITS tablet, Take 1,000 Units by mouth daily., Disp: , Rfl:  .  fluticasone (FLONASE) 50 MCG/ACT nasal spray, Place 2 sprays into the nose daily as needed for allergies or rhinitis. , Disp: , Rfl:  .  Fluticasone Furoate-Vilanterol (BREO ELLIPTA) 100-25 MCG/INH AEPB, Inhale 1 puff into the lungs daily. Rinse mouth, Disp: 60 each, Rfl: 11 .   Ibuprofen (ADVIL) 200 MG CAPS, Take 400 mg by mouth once as needed (pain.)., Disp: , Rfl:  .  lisinopril-hydrochlorothiazide (PRINZIDE,ZESTORETIC) 20-12.5 MG per tablet, Take 1 tablet by mouth every morning. , Disp: , Rfl:  .  loratadine (CLARITIN) 10 MG tablet, Take 10 mg by mouth as needed. , Disp: , Rfl:  .  loratadine-pseudoephedrine (CLARITIN-D 12-HOUR) 5-120 MG per tablet, Take 1 tablet by mouth daily as needed., Disp: , Rfl:  .  Naproxen Sodium (ALEVE PO), Take 2 capsules by mouth once as needed (pain). , Disp: , Rfl:  .  NON FORMULARY, Allergy Vaccine -Meg Whelan/twice a month per pt, Disp: , Rfl:  .  NON FORMULARY, CPAP 11, Disp: , Rfl:  .  testosterone cypionate (DEPOTESTOSTERONE CYPIONATE) 200 MG/ML injection, Inject 200 mg into the muscle every 21 ( twenty-one) days., Disp: , Rfl:  .  VENTOLIN HFA 108 (90 Base) MCG/ACT inhaler, USE 2 PUFFS EVERY 4 TO 6 HOURS AS NEEDED FOR COUGH/WHEEZING., Disp: 18 g, Rfl: 0 .  vitamin C (ASCORBIC ACID) 500 MG tablet, Take 500 mg by mouth once as needed. , Disp: , Rfl:  .  temazepam (RESTORIL) 15 MG capsule, Take 1 capsule (15 mg total) by mouth at bedtime as needed for sleep. (Patient not taking: Reported on 02/20/2016), Disp: 30 capsule, Rfl: 5  Filed Vitals:   02/20/16 0932 02/20/16 0936  BP: 145/91 144/86  Pulse: 79 79  Temp: 97.5 F (  36.4 C)   Resp: 18   Height: 6\' 3"  (1.905 m)   Weight: 345 lb (156.491 kg)   SpO2: 96%     Body mass index is 43.12 kg/(m^2).           Review of Systems denies chest pain but does have mild dyspnea on exertion. Has history of morbid obesity. Denies claudication.     Objective:   Physical Exam BP 144/86 mmHg  Pulse 79  Temp(Src) 97.5 F (36.4 C)  Resp 18  Ht 6\' 3"  (1.905 m)  Wt 345 lb (156.491 kg)  BMI 43.12 kg/m2  SpO2 96%  General morbidly obese male no apparent distress alert and oriented 3 Lungs no rhonchi or wheezing Cardiovascular regular rhythm no murmurs Left leg with extensive  bulging varicosities in the medial and anterior thigh extending into the medial calf with severe hyperpigmentation lower half left leg and 1+ edema. 3+ dorsalis pedis pulse palpable.  Patient had a venous duplex reflux study performed in our office on 02/13/2016 which I have reviewed and also performed an [place] bedside SonoSite exam which confirms these findings. Patient has a very large anterior accessory branch of the left great saphenous vein which is supplying these painful varicosities and there is gross reflux throughout. The main trunk of the left great saphenous vein is successfully closed from the previous procedure. There is no DVT.     Assessment:     Recurrent severe painful varicosities left leg due to severe gross reflux and large anterior accessory branch left great saphenous vein. Successful previous closure of left great saphenous main trunk. Varicosities are causing pain and swelling and affecting patient's daily living area Morbid obesity Hypertension    Plan:     Believe we should proceed with laser ablation of the anterior accessory branch left great saphenous vein with greater than 20 stab phlebectomy of painful varicosities to hopefully relieve his pain and improve his swelling We will proceed with precertification to perform this in the near future He has been wearing long leg elastic compression stockings 20-30 millimeter gradient for 24 months documented as well as trying other conservative measures.

## 2016-02-26 DIAGNOSIS — E291 Testicular hypofunction: Secondary | ICD-10-CM | POA: Diagnosis not present

## 2016-02-28 DIAGNOSIS — J301 Allergic rhinitis due to pollen: Secondary | ICD-10-CM | POA: Diagnosis not present

## 2016-02-28 DIAGNOSIS — J3089 Other allergic rhinitis: Secondary | ICD-10-CM | POA: Diagnosis not present

## 2016-03-26 ENCOUNTER — Ambulatory Visit: Payer: BLUE CROSS/BLUE SHIELD | Admitting: Vascular Surgery

## 2016-03-26 ENCOUNTER — Encounter (HOSPITAL_COMMUNITY): Payer: BLUE CROSS/BLUE SHIELD

## 2016-03-28 DIAGNOSIS — J301 Allergic rhinitis due to pollen: Secondary | ICD-10-CM | POA: Diagnosis not present

## 2016-03-28 DIAGNOSIS — J3089 Other allergic rhinitis: Secondary | ICD-10-CM | POA: Diagnosis not present

## 2016-04-01 DIAGNOSIS — E291 Testicular hypofunction: Secondary | ICD-10-CM | POA: Diagnosis not present

## 2016-04-10 DIAGNOSIS — I83813 Varicose veins of bilateral lower extremities with pain: Secondary | ICD-10-CM | POA: Diagnosis not present

## 2016-04-10 DIAGNOSIS — I83893 Varicose veins of bilateral lower extremities with other complications: Secondary | ICD-10-CM | POA: Diagnosis not present

## 2016-04-10 DIAGNOSIS — I8311 Varicose veins of right lower extremity with inflammation: Secondary | ICD-10-CM | POA: Diagnosis not present

## 2016-04-11 DIAGNOSIS — J301 Allergic rhinitis due to pollen: Secondary | ICD-10-CM | POA: Diagnosis not present

## 2016-04-11 DIAGNOSIS — J3089 Other allergic rhinitis: Secondary | ICD-10-CM | POA: Diagnosis not present

## 2016-04-30 DIAGNOSIS — J3089 Other allergic rhinitis: Secondary | ICD-10-CM | POA: Diagnosis not present

## 2016-04-30 DIAGNOSIS — J301 Allergic rhinitis due to pollen: Secondary | ICD-10-CM | POA: Diagnosis not present

## 2016-05-04 DIAGNOSIS — Z23 Encounter for immunization: Secondary | ICD-10-CM | POA: Diagnosis not present

## 2016-05-06 DIAGNOSIS — H524 Presbyopia: Secondary | ICD-10-CM | POA: Diagnosis not present

## 2016-05-06 DIAGNOSIS — H2513 Age-related nuclear cataract, bilateral: Secondary | ICD-10-CM | POA: Diagnosis not present

## 2016-05-06 DIAGNOSIS — H5203 Hypermetropia, bilateral: Secondary | ICD-10-CM | POA: Diagnosis not present

## 2016-05-07 DIAGNOSIS — N401 Enlarged prostate with lower urinary tract symptoms: Secondary | ICD-10-CM | POA: Diagnosis not present

## 2016-05-07 DIAGNOSIS — N529 Male erectile dysfunction, unspecified: Secondary | ICD-10-CM | POA: Diagnosis not present

## 2016-05-07 DIAGNOSIS — E291 Testicular hypofunction: Secondary | ICD-10-CM | POA: Diagnosis not present

## 2016-05-16 DIAGNOSIS — J3089 Other allergic rhinitis: Secondary | ICD-10-CM | POA: Diagnosis not present

## 2016-05-16 DIAGNOSIS — J301 Allergic rhinitis due to pollen: Secondary | ICD-10-CM | POA: Diagnosis not present

## 2016-05-20 DIAGNOSIS — J301 Allergic rhinitis due to pollen: Secondary | ICD-10-CM | POA: Diagnosis not present

## 2016-05-20 DIAGNOSIS — J3089 Other allergic rhinitis: Secondary | ICD-10-CM | POA: Diagnosis not present

## 2016-05-27 ENCOUNTER — Other Ambulatory Visit: Payer: Self-pay | Admitting: *Deleted

## 2016-05-27 DIAGNOSIS — I83812 Varicose veins of left lower extremities with pain: Secondary | ICD-10-CM

## 2016-05-30 DIAGNOSIS — J301 Allergic rhinitis due to pollen: Secondary | ICD-10-CM | POA: Diagnosis not present

## 2016-05-30 DIAGNOSIS — J3089 Other allergic rhinitis: Secondary | ICD-10-CM | POA: Diagnosis not present

## 2016-06-04 DIAGNOSIS — E291 Testicular hypofunction: Secondary | ICD-10-CM | POA: Diagnosis not present

## 2016-06-07 ENCOUNTER — Other Ambulatory Visit: Payer: Self-pay | Admitting: Internal Medicine

## 2016-06-10 DIAGNOSIS — J3089 Other allergic rhinitis: Secondary | ICD-10-CM | POA: Diagnosis not present

## 2016-06-10 DIAGNOSIS — J301 Allergic rhinitis due to pollen: Secondary | ICD-10-CM | POA: Diagnosis not present

## 2016-06-13 DIAGNOSIS — L821 Other seborrheic keratosis: Secondary | ICD-10-CM | POA: Diagnosis not present

## 2016-06-13 DIAGNOSIS — D1801 Hemangioma of skin and subcutaneous tissue: Secondary | ICD-10-CM | POA: Diagnosis not present

## 2016-06-13 DIAGNOSIS — L570 Actinic keratosis: Secondary | ICD-10-CM | POA: Diagnosis not present

## 2016-06-13 DIAGNOSIS — L573 Poikiloderma of Civatte: Secondary | ICD-10-CM | POA: Diagnosis not present

## 2016-06-13 DIAGNOSIS — D225 Melanocytic nevi of trunk: Secondary | ICD-10-CM | POA: Diagnosis not present

## 2016-06-17 ENCOUNTER — Other Ambulatory Visit: Payer: Self-pay | Admitting: Internal Medicine

## 2016-06-17 MED ORDER — TEMAZEPAM 15 MG PO CAPS: 15.0000 mg | ORAL_CAPSULE | Freq: Every evening | ORAL | 5 refills | Status: DC | PRN

## 2016-06-17 NOTE — Telephone Encounter (Signed)
Refill request faxed back to [facility] at [phone].

## 2016-06-18 DIAGNOSIS — G4733 Obstructive sleep apnea (adult) (pediatric): Secondary | ICD-10-CM | POA: Diagnosis not present

## 2016-06-19 ENCOUNTER — Encounter: Payer: Self-pay | Admitting: Vascular Surgery

## 2016-06-24 DIAGNOSIS — E291 Testicular hypofunction: Secondary | ICD-10-CM | POA: Diagnosis not present

## 2016-06-25 DIAGNOSIS — J301 Allergic rhinitis due to pollen: Secondary | ICD-10-CM | POA: Diagnosis not present

## 2016-06-25 DIAGNOSIS — R05 Cough: Secondary | ICD-10-CM | POA: Diagnosis not present

## 2016-06-25 DIAGNOSIS — J3089 Other allergic rhinitis: Secondary | ICD-10-CM | POA: Diagnosis not present

## 2016-07-01 ENCOUNTER — Encounter: Payer: Self-pay | Admitting: Vascular Surgery

## 2016-07-01 ENCOUNTER — Ambulatory Visit (INDEPENDENT_AMBULATORY_CARE_PROVIDER_SITE_OTHER): Payer: BLUE CROSS/BLUE SHIELD | Admitting: Vascular Surgery

## 2016-07-01 VITALS — BP 157/102 | HR 68 | Temp 98.0°F | Resp 18 | Ht 75.0 in | Wt 345.0 lb

## 2016-07-01 DIAGNOSIS — I83892 Varicose veins of left lower extremities with other complications: Secondary | ICD-10-CM

## 2016-07-01 NOTE — Progress Notes (Signed)
Laser Ablation Procedure    Date: 07/01/2016   Casey Roach DOB:12-Sep-1960  Consent signed: Yes    Surgeon:  Dr. Nelda Severe. Kellie Simmering  Procedure: Laser Ablation: left Greater Saphenous Vein  BP (!) 157/102   Pulse 68   Temp 98 F (36.7 C)   Resp 18   Ht 6\' 3"  (1.905 m)   Wt (!) 345 lb (156.5 kg)   SpO2 98%   BMI 43.12 kg/m   Tumescent Anesthesia: 400 cc 0.9% NaCl with 50 cc Lidocaine HCL with 1% Epi and 15 cc 8.4% NaHCO3  Local Anesthesia: 17 cc Lidocaine HCL and NaHCO3 (ratio 2:1)  Pulsed Mode: 17 watts, 578ms delay, 1.0 duration  Total Energy:              Total Pulses:                Total Time:     Stab Phlebectomy: >20 Sites: Thigh and Calf  Patient tolerated procedure well  Notes:   Description of Procedure:  After marking the course of the secondary varicosities, the patient was placed on the operating table in the supine position, and the left leg was prepped and draped in sterile fashion.   Local anesthetic was administered and under ultrasound guidance the saphenous vein was accessed with a micro needle and guide wire; then the mirco puncture sheath was placed.  A guide wire was inserted saphenofemoral junction , followed by a 5 french sheath.  The position of the sheath and then the laser fiber below the junction was confirmed using the ultrasound.  Tumescent anesthesia was administered along the course of the saphenous vein using ultrasound guidance. The patient was placed in Trendelenburg position and protective laser glasses were placed on patient and staff, and the laser was fired at 15 watts continuous mode advancing 1-31mm/second for a total of 1306 joules.   For stab phlebectomies, local anesthetic was administered at the previously marked varicosities, and tumescent anesthesia was administered around the vessels.  Greater than 20 stab wounds were made using the tip of an 11 blade. And using the vein hook, the phlebectomies were performed using a hemostat to  avulse the varicosities.  Adequate hemostasis was achieved.     Steri strips were applied to the stab wounds and ABD pads and thigh high compression stockings were applied.  Ace wrap bandages were applied over the phlebectomy sites and at the top of the saphenofemoral junction. Blood loss was less than 15 cc.  The patient ambulated out of the operating room having tolerated the procedure well.

## 2016-07-01 NOTE — Progress Notes (Signed)
Subjective:     Patient ID: Casey Roach, male   DOB: [birth year], 55 y.o.   MRN: [id number]  HPI This 55 year old obese male had laser ablation of the anterior accessory branch of the left great saphenous vein from the mid thigh to near the saphenofemoral junction performed under local tumescent anesthesia. He also had greater than 30 stab phlebectomy of painful varicosities in the thigh and calf. Total of 1300 J of energy was utilized. He tolerated the procedure well.  Review of Systems     Objective:   Physical Exam BP (!) 157/102   Pulse 68   Temp 98 F (36.7 C)   Resp 18   Ht 6\' 3"  (1.905 m)   Wt (!) 345 lb (156.5 kg)   SpO2 98%   BMI 43.12 kg/m        Assessment:     Well-tolerated laser ablation anterior accessory branch left great saphenous vein plus greater than 20 stab phlebectomy of painful varicosities performed under local tumescent anesthesia    Plan:     Return in 1 week for venous duplex exam to confirm closure left great saphenous vein and this will complete patient's treatment regimen

## 2016-07-03 ENCOUNTER — Encounter: Payer: Self-pay | Admitting: Vascular Surgery

## 2016-07-08 ENCOUNTER — Encounter: Payer: Self-pay | Admitting: Vascular Surgery

## 2016-07-08 ENCOUNTER — Ambulatory Visit (HOSPITAL_COMMUNITY)
Admission: RE | Admit: 2016-07-08 | Discharge: 2016-07-08 | Disposition: A | Discharge status: Home / Self Care | Payer: BLUE CROSS/BLUE SHIELD | Source: Ambulatory Visit | Attending: Vascular Surgery | Admitting: Vascular Surgery

## 2016-07-08 ENCOUNTER — Ambulatory Visit (INDEPENDENT_AMBULATORY_CARE_PROVIDER_SITE_OTHER): Payer: Self-pay | Admitting: Vascular Surgery

## 2016-07-08 VITALS — BP 143/89 | HR 71 | Temp 97.6°F | Resp 16 | Ht 75.0 in | Wt 345.0 lb

## 2016-07-08 DIAGNOSIS — I83812 Varicose veins of left lower extremities with pain: Secondary | ICD-10-CM | POA: Insufficient documentation

## 2016-07-08 DIAGNOSIS — I868 Varicose veins of other specified sites: Secondary | ICD-10-CM

## 2016-07-08 DIAGNOSIS — I83893 Varicose veins of bilateral lower extremities with other complications: Secondary | ICD-10-CM

## 2016-07-08 DIAGNOSIS — Z9889 Other specified postprocedural states: Secondary | ICD-10-CM | POA: Insufficient documentation

## 2016-07-08 DIAGNOSIS — I83892 Varicose veins of left lower extremities with other complications: Secondary | ICD-10-CM

## 2016-07-08 NOTE — Progress Notes (Signed)
Subjective:     Patient ID: Casey Roach, male   DOB: 12/23/1960, 55 y.o.   MRN: [id number]  HPI This 55 year old male returns 1 week post-laser ablation left great saphenous vein with greater than 20 stab phlebectomy of painful varicosities. He states that the left leg feels better particular below the knee. He is having no pain in the stab phlebectomy sites. He has had been normal discomfort associated with the ablation in the proximal half of the left thigh. He denies any chest pain dyspnea on exertion or hemoptysis. He has worn his elastic compression stockings and take an ibuprofen as instructed.  Review of Systems     Objective:   Physical Exam BP (!) 143/89 (BP Location: Left Arm, Patient Position: Sitting, Cuff Size: Large)   Pulse 71   Temp 97.6 F (36.4 C)   Resp 16   Ht 6\' 3"  (1.905 m)   Wt (!) 345 lb (156.5 kg)   SpO2 99%   BMI 43.12 kg/m   General morbidly obese male no apparent distress alert and oriented 3 Lungs no rhonchi or wheezing Left leg with nicely healing stab phlebectomy sites in the distal thigh and medial calf. 1+ chronic edema distally. 3+ dorsalis pedis pulse palpable. Mild discomfort to deep palpation the proximal third of the thigh over the great saphenous vein. Right leg has bulging varicosities in the medial thigh and some in the medial calf.  Today I ordered a venous duplex exam the left leg which I reviewed and interpreted. There is no DVT. There is total occlusion of the anterior accessory saphenous vein in the mid thigh extending up to near the saphenofemoral junction where it becomes patent and communicates with another branch about 1 cm from these saphenofemoral junction. This is expected from the findings at the time of the ablation. Today I performed a bedside sono site exam confirming the above findings I also looked at the right leg which had no significant great saphenous vein remaining to treat with ablation.     Assessment:     Status  post laser ablation anterior accessory branch left great saphenous vein plus multiple stab phlebectomy (greater than 30) of painful varicosities with early good result Recurrent varicosities right thigh with previous history of ablation great saphenous vein in 2015 which does not appear to be recanalized thrill along distance but the great saphenous vein is patent in the distal thigh and proximal calf with reflux.    Plan:     Do not recommend any treatment on the right leg at this time If he should desire treatment and will probably be isolated stab phlebectomy of the right thigh varicosities in right calf varicosities Good early results on left leg and patient will return on a when necessary basis

## 2016-07-09 DIAGNOSIS — G4733 Obstructive sleep apnea (adult) (pediatric): Secondary | ICD-10-CM | POA: Diagnosis not present

## 2016-07-10 DIAGNOSIS — M19012 Primary osteoarthritis, left shoulder: Secondary | ICD-10-CM | POA: Diagnosis not present

## 2016-07-11 ENCOUNTER — Other Ambulatory Visit: Payer: Self-pay | Admitting: Internal Medicine

## 2016-07-17 ENCOUNTER — Encounter (INDEPENDENT_AMBULATORY_CARE_PROVIDER_SITE_OTHER): Payer: Self-pay

## 2016-07-17 DIAGNOSIS — I868 Varicose veins of other specified sites: Secondary | ICD-10-CM

## 2016-07-17 DIAGNOSIS — Z0000 Encounter for general adult medical examination without abnormal findings: Secondary | ICD-10-CM | POA: Diagnosis not present

## 2016-07-17 DIAGNOSIS — Z125 Encounter for screening for malignant neoplasm of prostate: Secondary | ICD-10-CM | POA: Diagnosis not present

## 2016-07-17 DIAGNOSIS — R7302 Impaired glucose tolerance (oral): Secondary | ICD-10-CM | POA: Diagnosis not present

## 2016-07-18 DIAGNOSIS — J301 Allergic rhinitis due to pollen: Secondary | ICD-10-CM | POA: Diagnosis not present

## 2016-07-18 DIAGNOSIS — J3089 Other allergic rhinitis: Secondary | ICD-10-CM | POA: Diagnosis not present

## 2016-07-22 DIAGNOSIS — Z1212 Encounter for screening for malignant neoplasm of rectum: Secondary | ICD-10-CM | POA: Diagnosis not present

## 2016-07-24 DIAGNOSIS — G510 Bell's palsy: Secondary | ICD-10-CM | POA: Diagnosis not present

## 2016-07-24 DIAGNOSIS — I10 Essential (primary) hypertension: Secondary | ICD-10-CM | POA: Diagnosis not present

## 2016-07-24 DIAGNOSIS — E298 Other testicular dysfunction: Secondary | ICD-10-CM | POA: Diagnosis not present

## 2016-07-24 DIAGNOSIS — G4739 Other sleep apnea: Secondary | ICD-10-CM | POA: Diagnosis not present

## 2016-07-24 DIAGNOSIS — R7302 Impaired glucose tolerance (oral): Secondary | ICD-10-CM | POA: Diagnosis not present

## 2016-07-24 DIAGNOSIS — Z1389 Encounter for screening for other disorder: Secondary | ICD-10-CM | POA: Diagnosis not present

## 2016-07-24 DIAGNOSIS — D751 Secondary polycythemia: Secondary | ICD-10-CM | POA: Diagnosis not present

## 2016-07-24 DIAGNOSIS — Z0000 Encounter for general adult medical examination without abnormal findings: Secondary | ICD-10-CM | POA: Diagnosis not present

## 2016-07-24 DIAGNOSIS — Z6841 Body Mass Index (BMI) 40.0 and over, adult: Secondary | ICD-10-CM | POA: Diagnosis not present

## 2016-07-25 DIAGNOSIS — E291 Testicular hypofunction: Secondary | ICD-10-CM | POA: Diagnosis not present

## 2016-07-31 ENCOUNTER — Ambulatory Visit: Payer: Self-pay | Admitting: Internal Medicine

## 2016-07-31 DIAGNOSIS — J301 Allergic rhinitis due to pollen: Secondary | ICD-10-CM | POA: Diagnosis not present

## 2016-07-31 DIAGNOSIS — J3089 Other allergic rhinitis: Secondary | ICD-10-CM | POA: Diagnosis not present

## 2016-08-03 IMAGING — US US ABDOMEN COMPLETE
1 series · 13 of 25 positions shown · non-contrast
Comparison: None in PACs

CLINICAL DATA: Mid abdominal pain intermittently for the past 3
months without nausea or vomiting

EXAM:
ABDOMEN ULTRASOUND COMPLETE

[Series 1: us abdomen complete · 0.37mm/px · 13 of 82 slices shown]
[im 1/82]
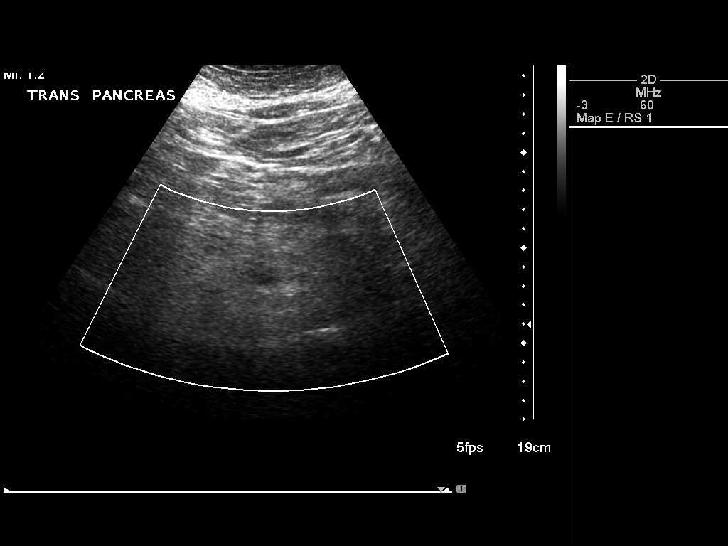
[im 7/82]
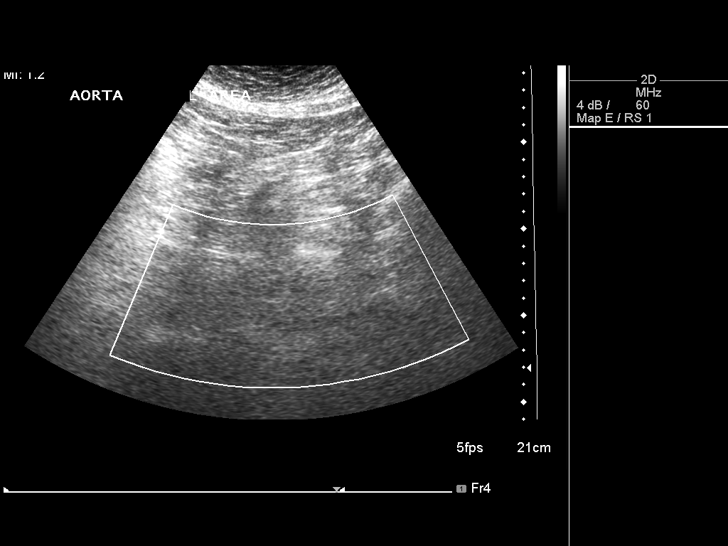
[im 14/82]
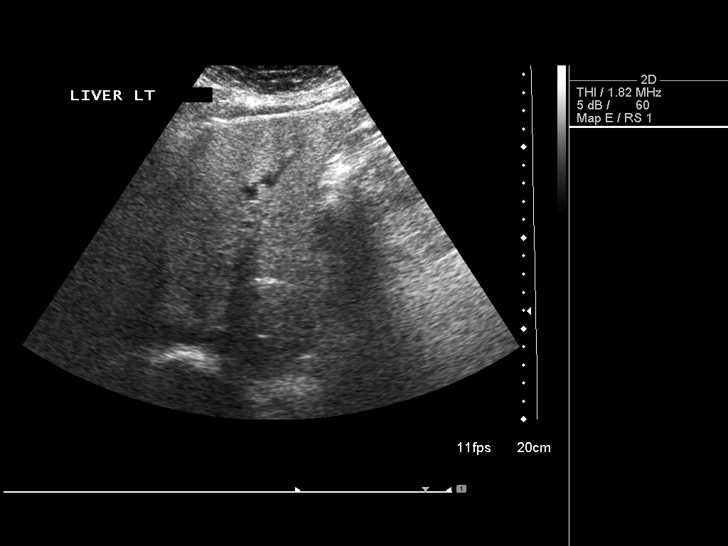
[im 21/82]
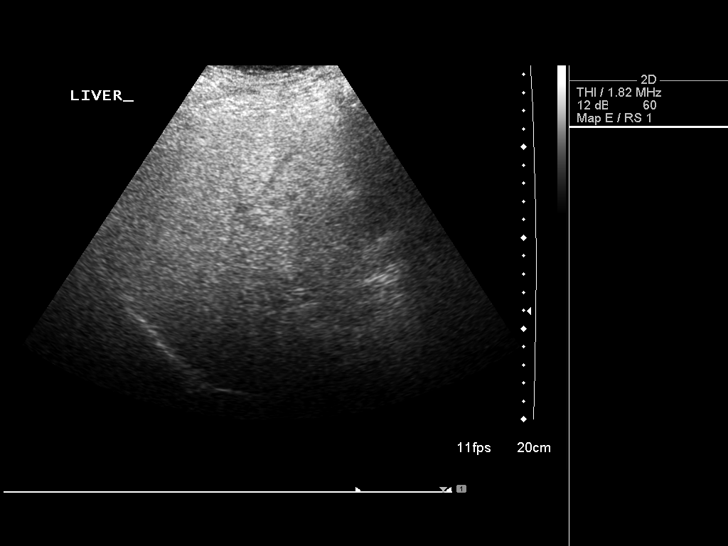
[im 28/82]
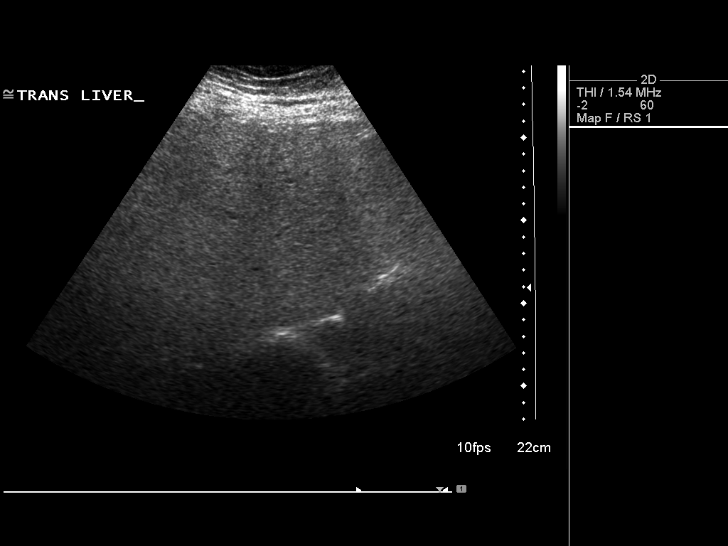
[im 34/82]
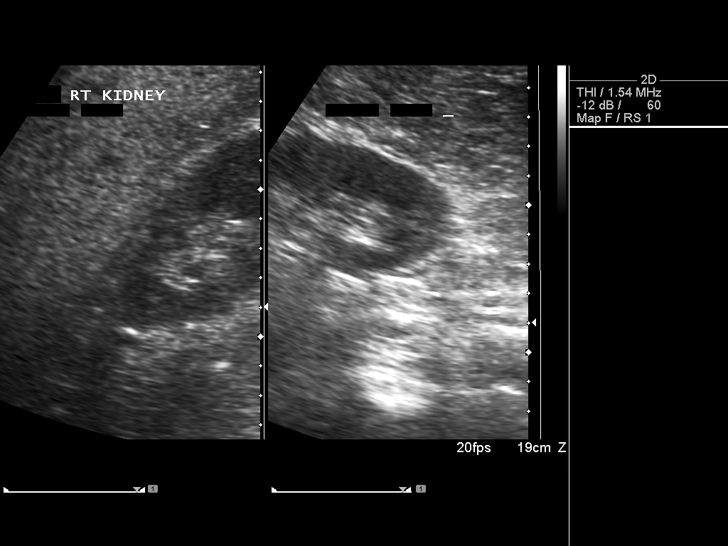
[im 41/82]
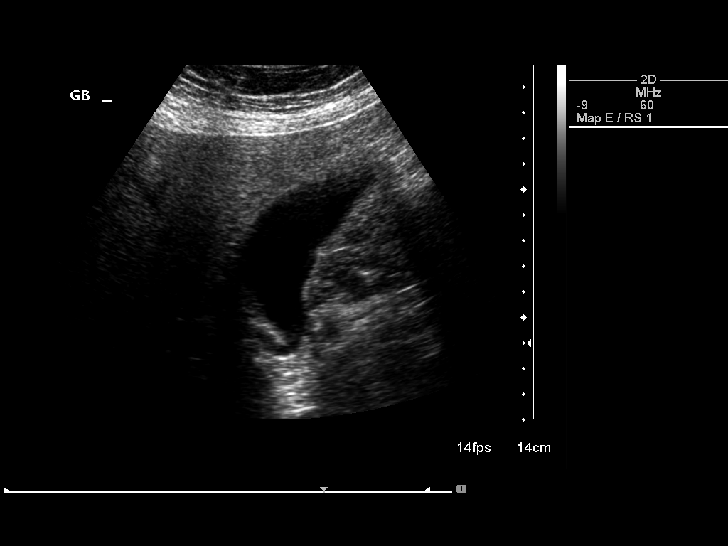
[im 48/82]
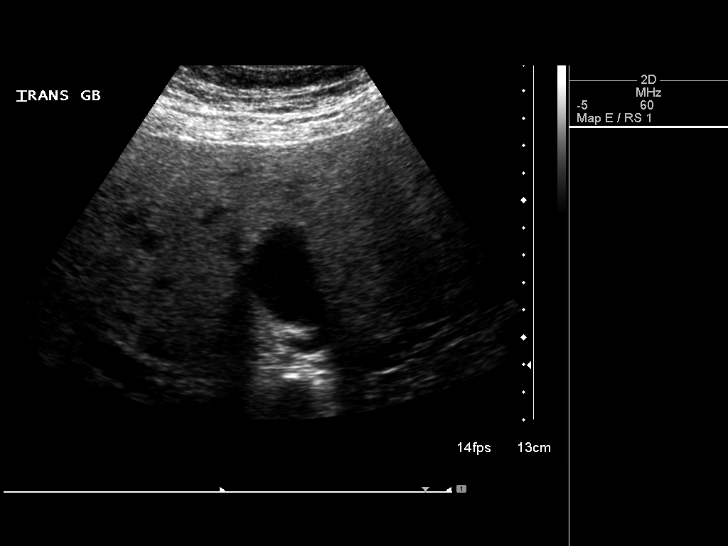
[im 55/82]
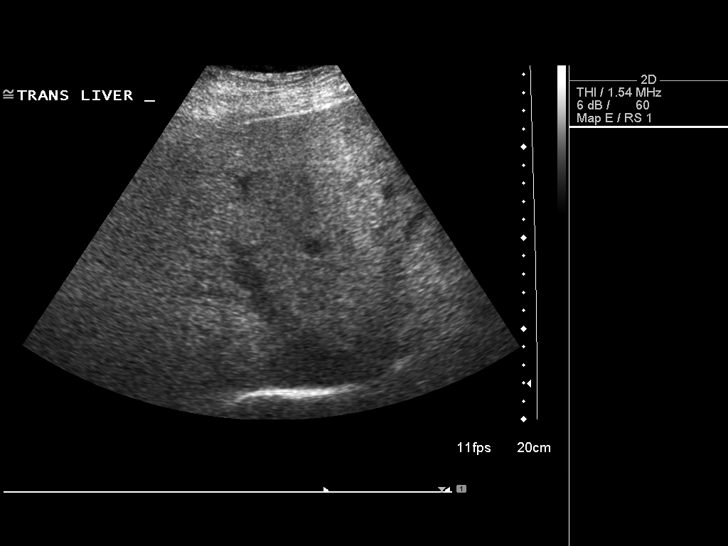
[im 61/82]
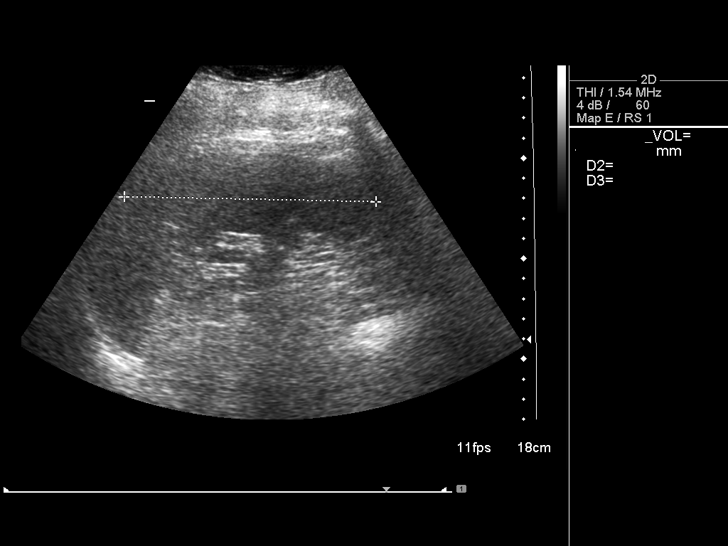
[im 68/82]
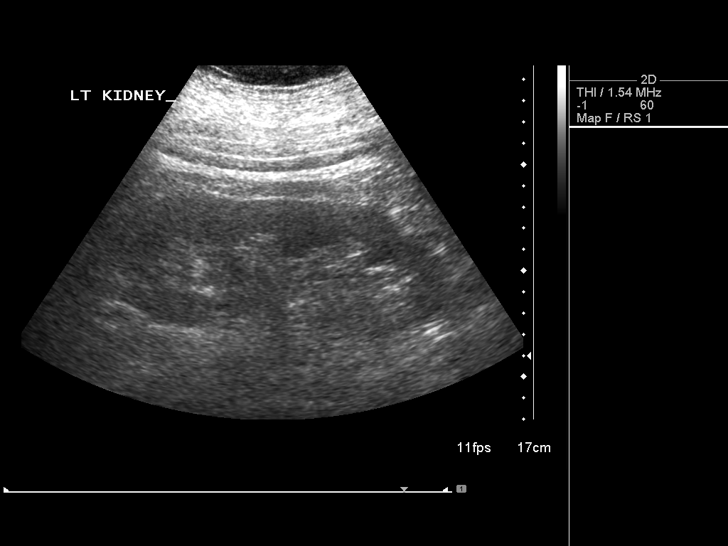
[im 75/82]
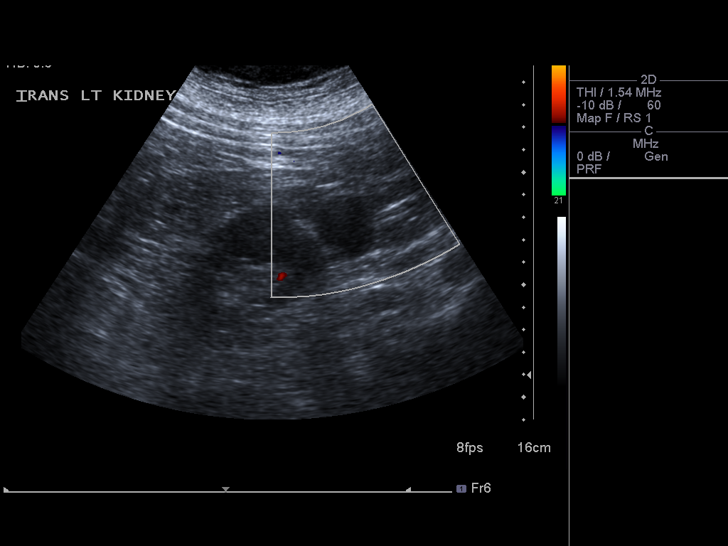
[im 82/82]
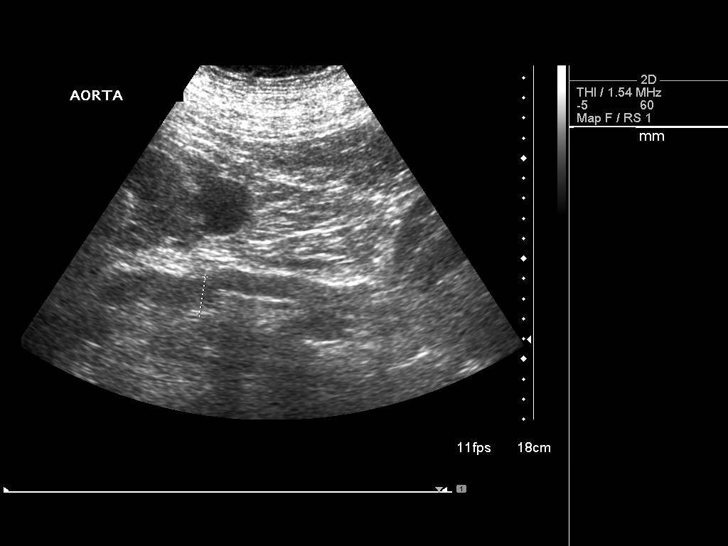

[13 of 25 positions shown; findings below may reference images not displayed]

FINDINGS: The study is limited due to the patient's body habitus.

Gallbladder: The gallbladder is adequately distended with no
evidence of stones, wall thickening, or pericholecystic fluid. There
is no positive sonographic Murphy's sign.

Common bile duct: Diameter: 3.5 mm.

Liver: The hepatic echotexture is increased diffusely. There is no
focal mass nor ductal dilation.

IVC: No abnormality visualized.

Pancreas: Bowel gas obscures much of the pancreatic body in all of
the pancreatic head and tail.

Spleen: The spleen is top-normal in size with calculated volume of
411 cc.

Right Kidney: Length: 13.5 cm. Echogenicity within normal limits. No
mass or hydronephrosis visualized.

Left Kidney: Length: 15.8 cm. Echogenicity within normal limits.
There is a cystic structure exophytic from the lower pole of the
left kidney measuring 3.2 cm in greatest dimension. There is no
hydronephrosis

Abdominal aorta: Largely obscured by bowel gas.

Other findings: No ascites is observed.
IMPRESSION: The study is somewhat limited due to the presence of bowel gas.

1. No gallstones or sonographic evidence of acute cholecystitis. If
gallbladder dysfunction is suspected clinically, a nuclear medicine
hepatobiliary scan may be useful.
2. Increased hepatic echotexture likely reflecting fatty
infiltration.
3. Top normal splenic size which may be appropriate for the
patient's body habitus.
4. Simple appearing cyst arising from the lower pole of the left
kidney.

## 2016-08-14 ENCOUNTER — Ambulatory Visit (HOSPITAL_BASED_OUTPATIENT_CLINIC_OR_DEPARTMENT_OTHER): Payer: BLUE CROSS/BLUE SHIELD | Admitting: Oncology

## 2016-08-14 ENCOUNTER — Ambulatory Visit: Payer: BLUE CROSS/BLUE SHIELD

## 2016-08-14 ENCOUNTER — Other Ambulatory Visit (HOSPITAL_BASED_OUTPATIENT_CLINIC_OR_DEPARTMENT_OTHER): Payer: BLUE CROSS/BLUE SHIELD

## 2016-08-14 ENCOUNTER — Other Ambulatory Visit: Payer: Self-pay | Admitting: *Deleted

## 2016-08-14 ENCOUNTER — Telehealth: Payer: Self-pay | Admitting: Oncology

## 2016-08-14 VITALS — BP 139/83 | HR 83 | Temp 98.9°F | Resp 18 | Wt 349.9 lb

## 2016-08-14 DIAGNOSIS — D751 Secondary polycythemia: Secondary | ICD-10-CM | POA: Diagnosis not present

## 2016-08-14 DIAGNOSIS — D45 Polycythemia vera: Secondary | ICD-10-CM

## 2016-08-14 LAB — CBC & DIFF AND RETIC
BASO%: 0.3 % (ref 0.0–2.0)
BASOS ABS: 0 10*3/uL (ref 0.0–0.1)
EOS%: 2.1 % (ref 0.0–7.0)
Eosinophils Absolute: 0.2 10*3/uL (ref 0.0–0.5)
HEMATOCRIT: 52.1 % — AB (ref 38.4–49.9)
HEMOGLOBIN: 18.4 g/dL — AB (ref 13.0–17.1)
Immature Retic Fract: 7.9 % (ref 3.00–10.60)
LYMPH%: 15.6 % (ref 14.0–49.0)
MCH: 31.9 pg (ref 27.2–33.4)
MCHC: 35.3 g/dL (ref 32.0–36.0)
MCV: 90.3 fL (ref 79.3–98.0)
MONO#: 0.6 10*3/uL (ref 0.1–0.9)
MONO%: 7 % (ref 0.0–14.0)
NEUT#: 6.5 10*3/uL (ref 1.5–6.5)
NEUT%: 75 % (ref 39.0–75.0)
PLATELETS: 162 10*3/uL (ref 140–400)
RBC: 5.77 10*6/uL (ref 4.20–5.82)
RDW: 13.8 % (ref 11.0–14.6)
RETIC %: 2.93 % — AB (ref 0.80–1.80)
Retic Ct Abs: 169.06 10*3/uL — ABNORMAL HIGH (ref 34.80–93.90)
WBC: 8.7 10*3/uL (ref 4.0–10.3)
lymph#: 1.4 10*3/uL (ref 0.9–3.3)
nRBC: 0 % (ref 0–0)

## 2016-08-14 NOTE — Progress Notes (Signed)
[facility] Initial Visit:  Patient Care Team: Burnard Bunting, MD as PCP - General (Internal Medicine)  CHIEF COMPLAINTS/PURPOSE OF CONSULTATION:   No history exists.    HISTORY OF PRESENTING ILLNESS: Casey Roach 56 y.o. male is here for evaluation of an elevated hematocrit. I do not have any prior CBCs available for me to review today, however patient is a good historian states that his last CBC performed in December 2017 demonstrated a hemoglobin in the 18 g/dL range and in the month prior to that it was in the 17 g/dL range. He stated that he has a history of obstructive sleep apnea for the past 15-20 years and he has been well controlled and compliant with his CPAP. He was also started on testosterone replacement about a year and a half ago for hypogonadism. He states that after he started his testosterone injections he's noted that he had increased erythema in his face, however he states that he was also recently diagnosed with rosacea. Patient is a nonsmoker. He denies any headache, blurry vision, history of any strokes. Overall he feels well.  Review of Systems  Constitutional: Negative for appetite change, chills, fatigue and fever.  HENT:   Negative for hearing loss, lump/mass, mouth sores, sore throat and tinnitus.   Eyes: Negative for eye problems and icterus.  Respiratory: Negative for chest tightness, cough, hemoptysis, shortness of breath and wheezing.   Cardiovascular: Negative for chest pain, leg swelling and palpitations.  Gastrointestinal: Negative for abdominal distention, abdominal pain, blood in stool, diarrhea, nausea and vomiting.  Endocrine: Negative.  Negative for hot flashes.  Genitourinary: Negative for difficulty urinating, frequency and hematuria.   Musculoskeletal: Negative for arthralgias and neck pain.  Skin: Negative for itching and rash.  Neurological: Negative for dizziness, headaches and speech difficulty.  Hematological:  Negative for adenopathy. Does not bruise/bleed easily.  Psychiatric/Behavioral: Negative for confusion. The patient is not nervous/anxious.     MEDICAL HISTORY: Past Medical History:  Diagnosis Date  . Acute bronchitis   . Allergy   . Morbid obesity ([place]) 06/02/2008   Qualifier: Diagnosis of  By: Julien Girt CMA, Leigh    . Obesity, unspecified   . Obstructive sleep apnea (adult) (pediatric)   . Unspecified essential hypertension     SURGICAL HISTORY: Past Surgical History:  Procedure Laterality Date  . COLONOSCOPY WITH PROPOFOL N/A 05/31/2014   Procedure: COLONOSCOPY WITH PROPOFOL;  Surgeon: Inda Castle, MD;  Location: [facility];  Service: Endoscopy;  Laterality: N/A;  . LASER ABLATION  03/28/2014, 05/16/2014   left leg, right leg  . VARICOCELE EXCISION      SOCIAL HISTORY: Social History   Social History  . Marital status: Single    Spouse name: N/A  . Number of children: N/A  . Years of education: N/A   Occupational History  . 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 Smoking status: Never Smoker  . Smokeless tobacco: Never Used  . Alcohol use Yes     Comment: rarely  . Drug use: No  . Sexual activity: Not on file   Other Topics Concern  . Not on file   Social History Narrative  . No narrative on file    FAMILY HISTORY No family history on file.  ALLERGIES:  has No Known Allergies.  MEDICATIONS:  Current Outpatient Prescriptions  Medication Sig Dispense Refill  . cholecalciferol (VITAMIN D) 1000 UNITS tablet Take 1,000 Units by mouth daily.    Marland Kitchen  fluticasone (FLONASE) 50 MCG/ACT nasal spray Place 2 sprays into the nose daily as needed for allergies or rhinitis.     . Fluticasone Furoate-Vilanterol (BREO ELLIPTA) 100-25 MCG/INH AEPB Inhale 1 puff into the lungs daily. Rinse mouth 60 each 11  . Ibuprofen (ADVIL) 200 MG CAPS Take 400 mg by mouth once as needed (pain.).    Marland Kitchen lisinopril-hydrochlorothiazide  (PRINZIDE,ZESTORETIC) 20-12.5 MG per tablet Take 1 tablet by mouth every morning.     . loratadine (CLARITIN) 10 MG tablet Take 10 mg by mouth as needed.     . loratadine-pseudoephedrine (CLARITIN-D 12-HOUR) 5-120 MG per tablet Take 1 tablet by mouth daily as needed.    . Naproxen Sodium (ALEVE PO) Take 2 capsules by mouth once as needed (pain).     . NON FORMULARY Allergy Vaccine -Meg Whelan/twice a month per pt    . NON FORMULARY CPAP 11    . temazepam (RESTORIL) 15 MG capsule Take 1 capsule (15 mg total) by mouth at bedtime as needed for sleep. 30 capsule 5  . testosterone cypionate (DEPOTESTOSTERONE CYPIONATE) 200 MG/ML injection Inject 200 mg into the muscle every 21 ( twenty-one) days.    . VENTOLIN HFA 108 (90 Base) MCG/ACT inhaler USE 2 PUFFS EVERY 4 TO 6 HOURS AS NEEDED FOR COUGH/WHEEZING. 18 g 2  . vitamin C (ASCORBIC ACID) 500 MG tablet Take 500 mg by mouth once as needed.      No current facility-administered medications for this visit.     PHYSICAL EXAMINATION:  ECOG PERFORMANCE STATUS: 0 - Asymptomatic   Vitals:   08/14/16 1025  BP: 139/83  Pulse: 83  Resp: 18  Temp: 98.9 F (37.2 C)    Filed Weights   08/14/16 1025  Weight: (!) 349 lb 14.4 oz (158.7 kg)     Physical Exam  Constitutional: He is oriented to person, place, and time and well-developed, well-nourished, and in no distress. No distress.  HENT:  Head: Normocephalic and atraumatic.  Mouth/Throat: No oropharyngeal exudate.  Facial erythema  Eyes: Conjunctivae are normal. Pupils are equal, round, and reactive to light. No scleral icterus.  Neck: Normal range of motion. Neck supple. No JVD present.  Cardiovascular: Normal rate, regular rhythm and normal heart sounds.  Exam reveals no gallop and no friction rub.   No murmur heard. Pulmonary/Chest: Breath sounds normal. No respiratory distress. He has no wheezes. He has no rales.  Abdominal: Soft. Bowel sounds are normal. He exhibits no distension. There  is no tenderness. There is no guarding.  Musculoskeletal: He exhibits no edema or tenderness.  Lymphadenopathy:    He has no cervical adenopathy.  Neurological: He is alert and oriented to person, place, and time. No cranial nerve deficit.  Skin: Skin is warm and dry. No rash noted. No erythema. No pallor.  Psychiatric: Affect and judgment normal.     LABORATORY DATA: I have personally reviewed the data as listed:  Appointment on 08/14/2016  Component Date Value Ref Range Status  . WBC 08/14/2016 8.7  4.0 - 10.3 10e3/uL Final  . NEUT# 08/14/2016 6.5  1.5 - 6.5 10e3/uL Final  . HGB 08/14/2016 18.4* 13.0 - 17.1 g/dL Final  . HCT 08/14/2016 52.1* 38.4 - 49.9 % Final  . Platelets 08/14/2016 162  140 - 400 10e3/uL Final  . MCV 08/14/2016 90.3  79.3 - 98.0 fL Final  . MCH 08/14/2016 31.9  27.2 - 33.4 pg Final  . MCHC 08/14/2016 35.3  32.0 - 36.0 g/dL Final  .  RBC 08/14/2016 5.77  4.20 - 5.82 10e6/uL Final  . RDW 08/14/2016 13.8  11.0 - 14.6 % Final  . lymph# 08/14/2016 1.4  0.9 - 3.3 10e3/uL Final  . MONO# 08/14/2016 0.6  0.1 - 0.9 10e3/uL Final  . Eosinophils Absolute 08/14/2016 0.2  0.0 - 0.5 10e3/uL Final  . Basophils Absolute 08/14/2016 0.0  0.0 - 0.1 10e3/uL Final  . NEUT% 08/14/2016 75.0  39.0 - 75.0 % Final  . LYMPH% 08/14/2016 15.6  14.0 - 49.0 % Final  . MONO% 08/14/2016 7.0  0.0 - 14.0 % Final  . EOS% 08/14/2016 2.1  0.0 - 7.0 % Final  . BASO% 08/14/2016 0.3  0.0 - 2.0 % Final  . nRBC 08/14/2016 0  0 - 0 % Final  . Retic % 08/14/2016 2.93* 0.80 - 1.80 % Final  . Retic Ct Abs 08/14/2016 169.06* 34.80 - 93.90 10e3/uL Final  . Immature Retic Fract 08/14/2016 7.90  3.00 - 10.60 % Final    RADIOGRAPHIC STUDIES: I have personally reviewed the radiological images as listed and agree with the findings in the report  No results found.  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   Plan: I will rule out primary polycythemia vera, and I will check JAK2 mutation and erythropoietin level and a CBC today. JAK-2 mutation is positive in over 98% in patients who have primary polycythemia vera. If his JAK2 mutation should turn out positive with a subnormal erythropoietin level, I will proceed with bone marrow biopsy for definitive diagnosis of primary polycythemia vera. However, if his JAK2 mutation is negative, he is unlikely to have primary polycythemia vera and this is likely secondary polycythemia, at which point I recommend for him to discontinue his testosterone replacement and see if his hemoglobin returns back to the normal range. Return to clinic in 1 week to review lab results and to discuss next plan of care.  Orders Placed This Encounter  Procedures  . JAK2 (including V617F and Exon 12), MPL, and CALR-Next Generation Sequencing    Standing Status:   Future    Number of Occurrences:   1    Standing Expiration Date:   08/14/2017  . Erythropoietin    Standing Status:   Future    Number of Occurrences:   1    Standing Expiration Date:   08/14/2017    All questions were answered. The patient knows to call the clinic with any problems, questions or concerns.  This note was electronically signed.    Twana First, MD  08/14/2016 2:34 PM

## 2016-08-14 NOTE — Telephone Encounter (Signed)
Gave patient avs report and appointments for January  °

## 2016-08-15 LAB — ERYTHROPOIETIN: ERYTHROPOIETIN: 5.4 m[IU]/mL (ref 2.6–18.5)

## 2016-08-20 ENCOUNTER — Ambulatory Visit (HOSPITAL_BASED_OUTPATIENT_CLINIC_OR_DEPARTMENT_OTHER): Payer: BLUE CROSS/BLUE SHIELD | Admitting: Oncology

## 2016-08-20 ENCOUNTER — Telehealth: Payer: Self-pay | Admitting: Oncology

## 2016-08-20 VITALS — BP 156/93 | HR 89 | Temp 98.1°F | Resp 18 | Ht 75.0 in | Wt 351.3 lb

## 2016-08-20 DIAGNOSIS — D751 Secondary polycythemia: Secondary | ICD-10-CM | POA: Diagnosis not present

## 2016-08-20 NOTE — Telephone Encounter (Signed)
Appointments was scheduled, per 08/20/16 los. Patient was given a copy of the appointment schedule and AVS report,per 08/20/16 los.

## 2016-08-20 NOTE — Progress Notes (Signed)
[facility] Follow up:    Geoffery Lyons, MD [place] [address]   DIAGNOSIS: secondary polycythemia   SUMMARY OF ONCOLOGIC HISTORY:  No history exists.    CURRENT THERAPY: none  INTERVAL HISTORY: Casey Roach 56 y.o. male returns for elevated hemoglobin/hematocrit and to review of labwork performed on his last visit. Lab work performed on 08/14/16 demonstrated WBC 8.7K, hemoglobin 18.4 g/dL, hematocrit 52.1%, platelet count 162K, erythropoietin level 5.4. Unfortunately the JAK 2 mutation results is not back at this time. Patient has no new complaints today.   Patient Active Problem List   Diagnosis Date Noted  . Varicose veins of left lower extremity with complications 61/95/0932  . Varicose veins of bilateral lower extremities with other complications 67/07/4579  . Insomnia 12/03/2015  . History of colonic polyps 05/31/2014  . Benign neoplasm of colon 05/31/2014  . Varicose veins without complication 99/83/3825  . Varicose veins of lower extremities with other complications 05/39/7673  . Varicose veins of lower extremities with ulcer and inflammation ([place]) 02/08/2014  . Morbid obesity ([place]) 07/21/2013  . HTN (hypertension) 07/21/2013  . Dyspnea 07/21/2013  . ACUTE BRONCHITIS 06/29/2010  . Obstructive sleep apnea 06/02/2008  . HYPERTENSION 06/02/2008    has No Known Allergies.  MEDICAL HISTORY: Past Medical History:  Diagnosis Date  . Acute bronchitis   . Allergy   . Morbid obesity ([place]) 06/02/2008   Qualifier: Diagnosis of  By: Julien Roach CMA, Leigh    . Obesity, unspecified   . Obstructive sleep apnea (adult) (pediatric)   . Unspecified essential hypertension     SURGICAL HISTORY: Past Surgical History:  Procedure Laterality Date  . COLONOSCOPY WITH PROPOFOL N/A 05/31/2014   Procedure: COLONOSCOPY WITH PROPOFOL;  Surgeon: Inda Castle, MD;  Location: [facility];  Service: Endoscopy;  Laterality: N/A;  . LASER  ABLATION  03/28/2014, 05/16/2014   left leg, right leg  . VARICOCELE EXCISION      SOCIAL HISTORY: Social History   Social History  . Marital status: Single    Spouse name: N/A  . Number of children: N/A  . Years of education: N/A   Occupational History  . 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 Smoking status: Never Smoker  . Smokeless tobacco: Never Used  . Alcohol use Yes     Comment: rarely  . Drug use: No  . Sexual activity: Not on file   Other Topics Concern  . Not on file   Social History Narrative  . No narrative on file    FAMILY HISTORY: No family history on file.  Review of Systems  Constitutional: Negative for appetite change, chills, fatigue and fever.  HENT:   Negative for hearing loss, lump/mass, mouth sores, sore throat and tinnitus.   Eyes: Negative for eye problems and icterus.  Respiratory: Negative for chest tightness, cough, hemoptysis, shortness of breath and wheezing.   Cardiovascular: Negative for chest pain, leg swelling and palpitations.  Gastrointestinal: Negative for abdominal distention, abdominal pain, blood in stool, diarrhea, nausea and vomiting.  Endocrine: Negative.  Negative for hot flashes.  Genitourinary: Negative for difficulty urinating, frequency and hematuria.   Musculoskeletal: Negative for arthralgias and neck pain.  Skin: Negative for itching and rash.  Neurological: Negative for dizziness, headaches and speech difficulty.  Hematological: Negative for adenopathy. Does not bruise/bleed easily.  Psychiatric/Behavioral: Negative for confusion. The patient is not nervous/anxious.       PHYSICAL EXAMINATION  ECOG PERFORMANCE STATUS: 0 - Asymptomatic  Vitals:   08/20/16 1414  BP: (!) 156/93  Pulse: 89  Resp: 18  Temp: 98.1 F (36.7 C)    Physical Exam  Constitutional: He is oriented to person, place, and time and well-developed, well-nourished, and in no distress. No distress.  HENT:   Head: Normocephalic and atraumatic.  Mouth/Throat: No oropharyngeal exudate.  Eyes: Conjunctivae are normal. Pupils are equal, round, and reactive to light. No scleral icterus.  Neck: Normal range of motion. Neck supple. No JVD present.  Cardiovascular: Normal rate, regular rhythm and normal heart sounds.  Exam reveals no gallop and no friction rub.   No murmur heard. Pulmonary/Chest: Breath sounds normal. No respiratory distress. He has no wheezes. He has no rales.  Abdominal: Soft. Bowel sounds are normal. He exhibits no distension. There is no tenderness. There is no guarding.  Musculoskeletal: He exhibits no edema or tenderness.  Lymphadenopathy:    He has no cervical adenopathy.  Neurological: He is alert and oriented to person, place, and time. No cranial nerve deficit.  Skin: Skin is warm and dry. No rash noted. No erythema. No pallor.  Psychiatric: Affect and judgment normal.    LABORATORY DATA:  CBC    Component Value Date/Time   WBC 8.7 08/14/2016 1013   RBC 5.77 08/14/2016 1013   HGB 18.4 (H) 08/14/2016 1013   HCT 52.1 (H) 08/14/2016 1013   PLT 162 08/14/2016 1013   MCV 90.3 08/14/2016 1013   MCH 31.9 08/14/2016 1013   MCHC 35.3 08/14/2016 1013   RDW 13.8 08/14/2016 1013   LYMPHSABS 1.4 08/14/2016 1013   MONOABS 0.6 08/14/2016 1013   EOSABS 0.2 08/14/2016 1013   BASOSABS 0.0 08/14/2016 1013    CMP     Component Value Date/Time   NA 135 10/27/2010 1323   K 3.7 10/27/2010 1323   CL 105 10/27/2010 1323   CO2 23 10/27/2010 1323   GLUCOSE 118 (H) 10/27/2010 1323   BUN 15 10/27/2010 1323   CREATININE 0.88 10/27/2010 1323   CALCIUM 9.1 10/27/2010 1323   GFRNONAA >60 10/27/2010 1323   GFRAA  10/27/2010 1323    >60        The eGFR has been calculated using the MDRD equation. This calculation has not been validated in all clinical situations. eGFR's persistently <60 mL/min signify possible Chronic Kidney Disease.       PENDING LABS: JAK2  mutation   RADIOGRAPHIC STUDIES:  No results found.   PATHOLOGY:     ASSESSMENT: 56 year old male presents today for evaluation of elevated hemoglobin/hematocrit likely due to secondary polycythemia from testosterone injections.  PLAN: I have reviewed patient's lab work from 08/14/16 in detail with the patient today. Unfortunately the JAK 2 mutation results is not back at this time. I have told patient that I will call him with the results once the JAK 2 mutation. I have low suspicion at this time the patient has primary polycythemia vera I do believe this is likely secondary to his testosterone injections. Return to clinic in 3 months for follow-up with repeat CBC prior to his next visit.  All questions were answered. The patient knows to call the clinic with any problems, questions or concerns. We can certainly see the patient much sooner if necessary. This note was electronically signed.   Twana First, MD 08/20/2016

## 2016-08-22 ENCOUNTER — Telehealth: Payer: Self-pay | Admitting: Oncology

## 2016-08-22 NOTE — Telephone Encounter (Signed)
Attempted to call patient today to give JAK2 results but he is not available.

## 2016-08-28 ENCOUNTER — Telehealth: Payer: Self-pay | Admitting: Oncology

## 2016-08-28 NOTE — Telephone Encounter (Signed)
Patient called today for his JAK2 results. This was discussed with him over the phone. All his questions were answered to his satisfaction.

## 2016-08-29 DIAGNOSIS — J3089 Other allergic rhinitis: Secondary | ICD-10-CM | POA: Diagnosis not present

## 2016-08-29 DIAGNOSIS — E291 Testicular hypofunction: Secondary | ICD-10-CM | POA: Diagnosis not present

## 2016-08-29 DIAGNOSIS — J301 Allergic rhinitis due to pollen: Secondary | ICD-10-CM | POA: Diagnosis not present

## 2016-09-23 DIAGNOSIS — J3089 Other allergic rhinitis: Secondary | ICD-10-CM | POA: Diagnosis not present

## 2016-09-23 DIAGNOSIS — J301 Allergic rhinitis due to pollen: Secondary | ICD-10-CM | POA: Diagnosis not present

## 2016-10-03 DIAGNOSIS — E291 Testicular hypofunction: Secondary | ICD-10-CM | POA: Diagnosis not present

## 2016-10-11 DIAGNOSIS — J3089 Other allergic rhinitis: Secondary | ICD-10-CM | POA: Diagnosis not present

## 2016-10-11 DIAGNOSIS — J301 Allergic rhinitis due to pollen: Secondary | ICD-10-CM | POA: Diagnosis not present

## 2016-11-05 DIAGNOSIS — N521 Erectile dysfunction due to diseases classified elsewhere: Secondary | ICD-10-CM | POA: Diagnosis not present

## 2016-11-05 DIAGNOSIS — N529 Male erectile dysfunction, unspecified: Secondary | ICD-10-CM | POA: Diagnosis not present

## 2016-11-05 DIAGNOSIS — J3089 Other allergic rhinitis: Secondary | ICD-10-CM | POA: Diagnosis not present

## 2016-11-05 DIAGNOSIS — E291 Testicular hypofunction: Secondary | ICD-10-CM | POA: Diagnosis not present

## 2016-11-05 DIAGNOSIS — J301 Allergic rhinitis due to pollen: Secondary | ICD-10-CM | POA: Diagnosis not present

## 2016-11-14 DIAGNOSIS — J3089 Other allergic rhinitis: Secondary | ICD-10-CM | POA: Diagnosis not present

## 2016-11-14 DIAGNOSIS — J301 Allergic rhinitis due to pollen: Secondary | ICD-10-CM | POA: Diagnosis not present

## 2016-11-19 ENCOUNTER — Other Ambulatory Visit: Payer: BLUE CROSS/BLUE SHIELD

## 2016-11-19 ENCOUNTER — Ambulatory Visit: Payer: BLUE CROSS/BLUE SHIELD

## 2016-11-21 ENCOUNTER — Other Ambulatory Visit: Payer: BLUE CROSS/BLUE SHIELD

## 2016-11-26 ENCOUNTER — Ambulatory Visit: Payer: BLUE CROSS/BLUE SHIELD | Admitting: Hematology

## 2016-12-02 DIAGNOSIS — E291 Testicular hypofunction: Secondary | ICD-10-CM | POA: Diagnosis not present

## 2016-12-04 DIAGNOSIS — J3089 Other allergic rhinitis: Secondary | ICD-10-CM | POA: Diagnosis not present

## 2016-12-04 DIAGNOSIS — J301 Allergic rhinitis due to pollen: Secondary | ICD-10-CM | POA: Diagnosis not present

## 2016-12-17 DIAGNOSIS — J301 Allergic rhinitis due to pollen: Secondary | ICD-10-CM | POA: Diagnosis not present

## 2016-12-17 DIAGNOSIS — J3089 Other allergic rhinitis: Secondary | ICD-10-CM | POA: Diagnosis not present

## 2017-01-02 DIAGNOSIS — E291 Testicular hypofunction: Secondary | ICD-10-CM | POA: Diagnosis not present

## 2017-01-07 DIAGNOSIS — J301 Allergic rhinitis due to pollen: Secondary | ICD-10-CM | POA: Diagnosis not present

## 2017-01-07 DIAGNOSIS — J3089 Other allergic rhinitis: Secondary | ICD-10-CM | POA: Diagnosis not present

## 2017-01-14 ENCOUNTER — Encounter: Payer: Self-pay | Admitting: Oncology

## 2017-01-14 NOTE — Progress Notes (Signed)
Patient came in with a bill for laboratory services from [facility] and needed to know if it was processed as in network or out of network. Called the company with the patient present and spoke with Tammy who states she did not have this information and contacting the insurance would be needed.  Attempted to locate claim information on [employer] and was unsuccessful. Called [employer] and spoke with [place] who advised that this was processed as in network. Wrote the information down provided by her for the patient including claim number, deductible, co-insurance amounts.  Relayed this information to patient and he wanted to know about financial assistance. Advised patient he would need to contact the number on the statement to discuss or make arrangements which is not a [employer]. Patient verbalized understanding and thanked me for my help. He has my card for any additional financial questions or concerns.

## 2017-01-16 DIAGNOSIS — J301 Allergic rhinitis due to pollen: Secondary | ICD-10-CM | POA: Diagnosis not present

## 2017-01-16 DIAGNOSIS — J3089 Other allergic rhinitis: Secondary | ICD-10-CM | POA: Diagnosis not present

## 2017-01-23 DIAGNOSIS — E291 Testicular hypofunction: Secondary | ICD-10-CM | POA: Diagnosis not present

## 2017-01-29 DIAGNOSIS — E291 Testicular hypofunction: Secondary | ICD-10-CM | POA: Diagnosis not present

## 2017-01-29 DIAGNOSIS — Z1389 Encounter for screening for other disorder: Secondary | ICD-10-CM | POA: Diagnosis not present

## 2017-01-29 DIAGNOSIS — R7302 Impaired glucose tolerance (oral): Secondary | ICD-10-CM | POA: Diagnosis not present

## 2017-01-29 DIAGNOSIS — J309 Allergic rhinitis, unspecified: Secondary | ICD-10-CM | POA: Diagnosis not present

## 2017-02-10 DIAGNOSIS — G4733 Obstructive sleep apnea (adult) (pediatric): Secondary | ICD-10-CM | POA: Diagnosis not present

## 2017-02-14 DIAGNOSIS — J3089 Other allergic rhinitis: Secondary | ICD-10-CM | POA: Diagnosis not present

## 2017-02-14 DIAGNOSIS — J301 Allergic rhinitis due to pollen: Secondary | ICD-10-CM | POA: Diagnosis not present

## 2017-02-26 DIAGNOSIS — J3089 Other allergic rhinitis: Secondary | ICD-10-CM | POA: Diagnosis not present

## 2017-02-26 DIAGNOSIS — J301 Allergic rhinitis due to pollen: Secondary | ICD-10-CM | POA: Diagnosis not present

## 2017-02-27 ENCOUNTER — Encounter (INDEPENDENT_AMBULATORY_CARE_PROVIDER_SITE_OTHER): Payer: BLUE CROSS/BLUE SHIELD

## 2017-02-27 DIAGNOSIS — I868 Varicose veins of other specified sites: Secondary | ICD-10-CM

## 2017-02-27 DIAGNOSIS — E291 Testicular hypofunction: Secondary | ICD-10-CM | POA: Diagnosis not present

## 2017-03-21 DIAGNOSIS — J3089 Other allergic rhinitis: Secondary | ICD-10-CM | POA: Diagnosis not present

## 2017-03-21 DIAGNOSIS — J301 Allergic rhinitis due to pollen: Secondary | ICD-10-CM | POA: Diagnosis not present

## 2017-03-25 DIAGNOSIS — E291 Testicular hypofunction: Secondary | ICD-10-CM | POA: Diagnosis not present

## 2017-04-11 DIAGNOSIS — J301 Allergic rhinitis due to pollen: Secondary | ICD-10-CM | POA: Diagnosis not present

## 2017-04-11 DIAGNOSIS — J3089 Other allergic rhinitis: Secondary | ICD-10-CM | POA: Diagnosis not present

## 2017-04-19 DIAGNOSIS — Z23 Encounter for immunization: Secondary | ICD-10-CM | POA: Diagnosis not present

## 2017-04-22 DIAGNOSIS — E291 Testicular hypofunction: Secondary | ICD-10-CM | POA: Diagnosis not present

## 2017-05-07 DIAGNOSIS — J301 Allergic rhinitis due to pollen: Secondary | ICD-10-CM | POA: Diagnosis not present

## 2017-05-07 DIAGNOSIS — J3089 Other allergic rhinitis: Secondary | ICD-10-CM | POA: Diagnosis not present

## 2017-05-08 DIAGNOSIS — H5203 Hypermetropia, bilateral: Secondary | ICD-10-CM | POA: Diagnosis not present

## 2017-05-08 DIAGNOSIS — L719 Rosacea, unspecified: Secondary | ICD-10-CM | POA: Diagnosis not present

## 2017-05-08 DIAGNOSIS — H524 Presbyopia: Secondary | ICD-10-CM | POA: Diagnosis not present

## 2017-05-08 DIAGNOSIS — H3500 Unspecified background retinopathy: Secondary | ICD-10-CM | POA: Diagnosis not present

## 2017-05-20 DIAGNOSIS — E291 Testicular hypofunction: Secondary | ICD-10-CM | POA: Diagnosis not present

## 2017-05-20 DIAGNOSIS — M19012 Primary osteoarthritis, left shoulder: Secondary | ICD-10-CM | POA: Diagnosis not present

## 2017-05-22 DIAGNOSIS — J301 Allergic rhinitis due to pollen: Secondary | ICD-10-CM | POA: Diagnosis not present

## 2017-05-22 DIAGNOSIS — J3089 Other allergic rhinitis: Secondary | ICD-10-CM | POA: Diagnosis not present

## 2017-05-23 DIAGNOSIS — J301 Allergic rhinitis due to pollen: Secondary | ICD-10-CM | POA: Diagnosis not present

## 2017-05-23 DIAGNOSIS — J3089 Other allergic rhinitis: Secondary | ICD-10-CM | POA: Diagnosis not present

## 2017-05-29 DIAGNOSIS — M7061 Trochanteric bursitis, right hip: Secondary | ICD-10-CM | POA: Diagnosis not present

## 2017-05-29 DIAGNOSIS — M5441 Lumbago with sciatica, right side: Secondary | ICD-10-CM | POA: Diagnosis not present

## 2017-06-02 ENCOUNTER — Other Ambulatory Visit: Payer: Self-pay | Admitting: Internal Medicine

## 2017-06-12 DIAGNOSIS — E291 Testicular hypofunction: Secondary | ICD-10-CM | POA: Diagnosis not present

## 2017-06-17 DIAGNOSIS — D1801 Hemangioma of skin and subcutaneous tissue: Secondary | ICD-10-CM | POA: Diagnosis not present

## 2017-06-17 DIAGNOSIS — L718 Other rosacea: Secondary | ICD-10-CM | POA: Diagnosis not present

## 2017-06-17 DIAGNOSIS — L821 Other seborrheic keratosis: Secondary | ICD-10-CM | POA: Diagnosis not present

## 2017-06-17 DIAGNOSIS — L573 Poikiloderma of Civatte: Secondary | ICD-10-CM | POA: Diagnosis not present

## 2017-06-24 DIAGNOSIS — J301 Allergic rhinitis due to pollen: Secondary | ICD-10-CM | POA: Diagnosis not present

## 2017-06-24 DIAGNOSIS — R05 Cough: Secondary | ICD-10-CM | POA: Diagnosis not present

## 2017-06-24 DIAGNOSIS — J3089 Other allergic rhinitis: Secondary | ICD-10-CM | POA: Diagnosis not present

## 2017-07-02 DIAGNOSIS — R8290 Unspecified abnormal findings in urine: Secondary | ICD-10-CM | POA: Diagnosis not present

## 2017-07-02 DIAGNOSIS — Z0000 Encounter for general adult medical examination without abnormal findings: Secondary | ICD-10-CM | POA: Diagnosis not present

## 2017-07-02 DIAGNOSIS — R7302 Impaired glucose tolerance (oral): Secondary | ICD-10-CM | POA: Diagnosis not present

## 2017-07-02 DIAGNOSIS — Z125 Encounter for screening for malignant neoplasm of prostate: Secondary | ICD-10-CM | POA: Diagnosis not present

## 2017-07-02 DIAGNOSIS — I10 Essential (primary) hypertension: Secondary | ICD-10-CM | POA: Diagnosis not present

## 2017-07-03 DIAGNOSIS — E291 Testicular hypofunction: Secondary | ICD-10-CM | POA: Diagnosis not present

## 2017-07-03 DIAGNOSIS — N529 Male erectile dysfunction, unspecified: Secondary | ICD-10-CM | POA: Diagnosis not present

## 2017-07-03 DIAGNOSIS — N401 Enlarged prostate with lower urinary tract symptoms: Secondary | ICD-10-CM | POA: Diagnosis not present

## 2017-07-09 DIAGNOSIS — M25551 Pain in right hip: Secondary | ICD-10-CM | POA: Diagnosis not present

## 2017-07-09 DIAGNOSIS — M5441 Lumbago with sciatica, right side: Secondary | ICD-10-CM | POA: Diagnosis not present

## 2017-07-09 DIAGNOSIS — I10 Essential (primary) hypertension: Secondary | ICD-10-CM | POA: Diagnosis not present

## 2017-07-09 DIAGNOSIS — R109 Unspecified abdominal pain: Secondary | ICD-10-CM | POA: Diagnosis not present

## 2017-07-09 DIAGNOSIS — Z6841 Body Mass Index (BMI) 40.0 and over, adult: Secondary | ICD-10-CM | POA: Diagnosis not present

## 2017-07-09 DIAGNOSIS — M7061 Trochanteric bursitis, right hip: Secondary | ICD-10-CM | POA: Diagnosis not present

## 2017-07-09 DIAGNOSIS — Z0000 Encounter for general adult medical examination without abnormal findings: Secondary | ICD-10-CM | POA: Diagnosis not present

## 2017-07-09 DIAGNOSIS — R7302 Impaired glucose tolerance (oral): Secondary | ICD-10-CM | POA: Diagnosis not present

## 2017-07-09 DIAGNOSIS — D751 Secondary polycythemia: Secondary | ICD-10-CM | POA: Diagnosis not present

## 2017-07-09 DIAGNOSIS — Z1389 Encounter for screening for other disorder: Secondary | ICD-10-CM | POA: Diagnosis not present

## 2017-07-11 DIAGNOSIS — E291 Testicular hypofunction: Secondary | ICD-10-CM | POA: Diagnosis not present

## 2017-07-17 DIAGNOSIS — Z1212 Encounter for screening for malignant neoplasm of rectum: Secondary | ICD-10-CM | POA: Diagnosis not present

## 2017-07-17 DIAGNOSIS — J3089 Other allergic rhinitis: Secondary | ICD-10-CM | POA: Diagnosis not present

## 2017-07-17 DIAGNOSIS — J301 Allergic rhinitis due to pollen: Secondary | ICD-10-CM | POA: Diagnosis not present

## 2017-07-17 LAB — IFOBT (OCCULT BLOOD): IFOBT: NEGATIVE

## 2017-07-18 ENCOUNTER — Ambulatory Visit: Payer: BLUE CROSS/BLUE SHIELD | Admitting: Internal Medicine

## 2017-07-18 ENCOUNTER — Telehealth: Payer: Self-pay | Admitting: Internal Medicine

## 2017-07-18 ENCOUNTER — Telehealth: Payer: Self-pay | Admitting: Gastroenterology

## 2017-07-18 ENCOUNTER — Encounter: Payer: Self-pay | Admitting: Internal Medicine

## 2017-07-18 VITALS — BP 140/82 | HR 78 | Ht 75.0 in | Wt 351.0 lb

## 2017-07-18 DIAGNOSIS — G4733 Obstructive sleep apnea (adult) (pediatric): Secondary | ICD-10-CM | POA: Diagnosis not present

## 2017-07-18 NOTE — Assessment & Plan Note (Signed)
He has 2 CPAP machines.  An older one that is used when he is out of town is comfortable at 10.5 CWP.  He asks we have his home machine changed from 10-10.5.  This machine is only about 58 years old.  Compliance and control are excellent

## 2017-07-18 NOTE — Telephone Encounter (Signed)
LMTCB to let pt know of recs per CDY

## 2017-07-18 NOTE — Patient Instructions (Signed)
Order- [facility]- please increase CPAP to 10.5 cwp, continue mask of choice, humidifier, supplies, AirView   Dx OSA   Please call us if we can help

## 2017-07-18 NOTE — Telephone Encounter (Signed)
Cy they do not have a pressure setting at 10.5cm.    Return in about 1 year (around 07/18/2018).  Order- [facility]- please increase CPAP to 10.5 cwp, continue mask of choice, humidifier, supplies, AirView   Dx OSA   Please call us if we can help     CY please advise on pressure setting.

## 2017-07-18 NOTE — Progress Notes (Signed)
HPI male never smoker followed for OSA, bronchitis, complicated by allergic rhinitis/ asthma( Dr Orvil Feil), morbid obesity, HBP,  NPSG 10/21/97-AHI 40/hour, desaturation to 80%, body weight 320 pounds --------------------------------------------------------------------------------------------  12/01/2015-56 year old male never smoker followed for OSA, bronchitis, complicated by allergic rhinitis( Dr Orvil Feil) CPAP 11/[facility] FOLLOWS FOR: [facility]-pt wears CPAP every night, pressure working well for patient but has noticed that he is having issues with his Valerie Salts will go by [facility] to get new one. Nasal pillows mask is working well for him but needs replacement. I suggested he show it to his DME company in person so they can recommend mask fit adjustments if needed. Sometimes pressure seems to high. He explains His Compliance Report by Saying Occasionally uses an older machine which doesn't record. Rare temazepam.  07/18/17- 56 year old male never smoker followed for OSA, bronchitis, complicated by allergic rhinitis( Dr Orvil Feil), morbid obesity CPAP 11/[facility] OSA; DME: [facility]. Pt feels his pressure needs to be at 10.5 on CPAP. Currently on 10. DL attached. Pt will need order for supplies as well.  Download 90% compliance, AHI 0.6/hour. Occasional temazepam for insomnia but overall sleep quality good. Weight today 351 pounds, up 21 pounds since his original NPSG  ROS-see HPI + = positive Constitutional:   No-   weight loss, night sweats, fevers, chills, fatigue, lassitude. HEENT:   No-  headaches, difficulty swallowing, tooth/dental problems, sore throat,       No-  sneezing, itching, ear ache, nasal congestion, post nasal drip,  CV:  No-   chest pain, orthopnea, PND, swelling in lower extremities, anasarca, dizziness, palpitations Resp: No-  acute shortness of breath with exertion or at rest.              No-   productive cough,  + non-productive cough,  No- coughing up of blood.              No-    change in color of mucus.  No- wheezing.   Skin: No-   rash or lesions. GI:  No-   heartburn, indigestion, abdominal pain, nausea, vomiting, GU:  MS:  No-   joint pain or swelling.  Neuro-     nothing unusual Psych:  No- change in mood or affect. No depression or anxiety.  No memory loss.  OBJ- Physical Exam General- Alert, Oriented, Affect-appropriate, Distress- none acute, morbidly obese Skin- rash-none, lesions- none, excoriation- none, + plethoric Lymphadenopathy- none Head- atraumatic            Eyes- Gross vision intact, PERRLA, conjunctivae and secretions clear, ? proptosis            Ears- Hearing, canals-normal            Nose- Clear, no-Septal dev, mucus, polyps, erosion, perforation             Throat- Mallampati III , mucosa clear , drainage- none, tonsils- atrophic Neck- flexible , trachea midline, no stridor , thyroid nl, carotid no bruit Chest - symmetrical excursion , unlabored           Heart/CV- RRR , no murmur , no gallop  , no rub, nl s1 s2                           - JVD- none , edema- none, stasis changes- none, varices- none           Lung- clear to P&A, wheeze- none, cough- none , dullness-none, rub- none  Chest wall-  Abd-  Br/ Gen/ Rectal- Not done, not indicated Extrem- cyanosis- none, clubbing, none, atrophy- none, strength- nl Neuro- grossly intact to observation

## 2017-07-18 NOTE — Assessment & Plan Note (Signed)
He may be a candidate for bariatric referral.

## 2017-07-18 NOTE — Telephone Encounter (Signed)
Ok- Please let him know that his machine can only be set for whole number pressures. We can leave it at 10 or increase to 11.

## 2017-07-21 NOTE — Telephone Encounter (Signed)
OK 

## 2017-07-21 NOTE — Telephone Encounter (Signed)
Pt returning call from late Friday 12/14.-tr

## 2017-07-21 NOTE — Telephone Encounter (Signed)
Pt would like to be called at [phone]

## 2017-07-21 NOTE — Telephone Encounter (Signed)
Spoke with pt, he states he wants the pressure to be set at 11cm. I placed another order to [facility] to have them change this on his machine. Nothing further is needed.

## 2017-07-22 DIAGNOSIS — G4733 Obstructive sleep apnea (adult) (pediatric): Secondary | ICD-10-CM | POA: Diagnosis not present

## 2017-07-23 DIAGNOSIS — J301 Allergic rhinitis due to pollen: Secondary | ICD-10-CM | POA: Diagnosis not present

## 2017-07-23 DIAGNOSIS — J3089 Other allergic rhinitis: Secondary | ICD-10-CM | POA: Diagnosis not present

## 2017-07-23 NOTE — Telephone Encounter (Signed)
LM on Vmail to call back to schedule an appt

## 2017-07-24 ENCOUNTER — Encounter: Payer: Self-pay | Admitting: Gastroenterology

## 2017-08-01 DIAGNOSIS — E291 Testicular hypofunction: Secondary | ICD-10-CM | POA: Diagnosis not present

## 2017-08-04 ENCOUNTER — Other Ambulatory Visit: Payer: Self-pay | Admitting: Internal Medicine

## 2017-08-04 NOTE — Telephone Encounter (Signed)
Patient requesting Rx refill of Temazepam 15 mg at bedtime PRN which was written on 06/17/16. He was last seen by Dr. Annamaria Boots on 07/18/2017.  Dr. Annamaria Boots please advise.   No Known Allergies   Current Outpatient Medications on File Prior to Visit  Medication Sig Dispense Refill  . cholecalciferol (VITAMIN D) 1000 UNITS tablet Take 1,000 Units by mouth daily.    . fluticasone (FLONASE) 50 MCG/ACT nasal spray Place 2 sprays into the nose daily as needed for allergies or rhinitis.     . Fluticasone Furoate-Vilanterol (BREO ELLIPTA) 100-25 MCG/INH AEPB Inhale 1 puff into the lungs daily. Rinse mouth 60 each 11  . Ibuprofen (ADVIL) 200 MG CAPS Take 400 mg by mouth once as needed (pain.).    Marland Kitchen lisinopril-hydrochlorothiazide (PRINZIDE,ZESTORETIC) 20-12.5 MG per tablet Take 1 tablet by mouth every morning.     . loratadine (CLARITIN) 10 MG tablet Take 10 mg by mouth as needed.     . loratadine-pseudoephedrine (CLARITIN-D 12-HOUR) 5-120 MG per tablet Take 1 tablet by mouth daily as needed.    . Naproxen Sodium (ALEVE PO) Take 2 capsules by mouth once as needed (pain).     . NON FORMULARY Allergy Vaccine -Meg Whelan/twice a month per pt    . NON FORMULARY CPAP 11    . temazepam (RESTORIL) 15 MG capsule Take 1 capsule (15 mg total) by mouth at bedtime as needed for sleep. 30 capsule 5  . testosterone cypionate (DEPOTESTOSTERONE CYPIONATE) 200 MG/ML injection Inject 200 mg into the muscle every 21 ( twenty-one) days.    . VENTOLIN HFA 108 (90 Base) MCG/ACT inhaler USE 2 PUFFS EVERY 4 TO 6 HOURS AS NEEDED FOR COUGH/WHEEZING. 18 g 2  . vitamin C (ASCORBIC ACID) 500 MG tablet Take 500 mg by mouth once as needed.      No current facility-administered medications on file prior to visit.

## 2017-08-04 NOTE — Telephone Encounter (Signed)
Ok to refill total 6 months 

## 2017-08-06 MED ORDER — TEMAZEPAM 15 MG PO CAPS: ORAL_CAPSULE | ORAL | 5 refills | Status: DC

## 2017-08-06 NOTE — Addendum Note (Signed)
Addended by: Valerie Salts on: 08/06/2017 05:03 PM   Modules accepted: Orders

## 2017-08-06 NOTE — Telephone Encounter (Signed)
Found RX on the printer. [facility] to see if they have received the RX refill via phone call, they had not.   Gave a verbal over the phone for 30 capsules with 5 refills. Will shred the printed RX. Phoned in RX has been documented in patient's chart.

## 2017-08-11 DIAGNOSIS — J3089 Other allergic rhinitis: Secondary | ICD-10-CM | POA: Diagnosis not present

## 2017-08-11 DIAGNOSIS — J301 Allergic rhinitis due to pollen: Secondary | ICD-10-CM | POA: Diagnosis not present

## 2017-08-13 DIAGNOSIS — J3089 Other allergic rhinitis: Secondary | ICD-10-CM | POA: Diagnosis not present

## 2017-08-13 DIAGNOSIS — J301 Allergic rhinitis due to pollen: Secondary | ICD-10-CM | POA: Diagnosis not present

## 2017-08-18 DIAGNOSIS — J301 Allergic rhinitis due to pollen: Secondary | ICD-10-CM | POA: Diagnosis not present

## 2017-08-18 DIAGNOSIS — J3089 Other allergic rhinitis: Secondary | ICD-10-CM | POA: Diagnosis not present

## 2017-08-21 DIAGNOSIS — J3089 Other allergic rhinitis: Secondary | ICD-10-CM | POA: Diagnosis not present

## 2017-08-21 DIAGNOSIS — J301 Allergic rhinitis due to pollen: Secondary | ICD-10-CM | POA: Diagnosis not present

## 2017-08-25 DIAGNOSIS — M25551 Pain in right hip: Secondary | ICD-10-CM | POA: Insufficient documentation

## 2017-08-26 DIAGNOSIS — E291 Testicular hypofunction: Secondary | ICD-10-CM | POA: Diagnosis not present

## 2017-09-02 DIAGNOSIS — J3089 Other allergic rhinitis: Secondary | ICD-10-CM | POA: Diagnosis not present

## 2017-09-02 DIAGNOSIS — J301 Allergic rhinitis due to pollen: Secondary | ICD-10-CM | POA: Diagnosis not present

## 2017-09-04 DIAGNOSIS — M7061 Trochanteric bursitis, right hip: Secondary | ICD-10-CM | POA: Diagnosis not present

## 2017-09-04 DIAGNOSIS — M25551 Pain in right hip: Secondary | ICD-10-CM | POA: Diagnosis not present

## 2017-09-04 DIAGNOSIS — G4733 Obstructive sleep apnea (adult) (pediatric): Secondary | ICD-10-CM | POA: Diagnosis not present

## 2017-09-09 ENCOUNTER — Ambulatory Visit: Payer: BLUE CROSS/BLUE SHIELD | Admitting: Gastroenterology

## 2017-09-15 ENCOUNTER — Ambulatory Visit: Payer: BLUE CROSS/BLUE SHIELD | Admitting: Gastroenterology

## 2017-09-15 ENCOUNTER — Encounter: Payer: Self-pay | Admitting: Gastroenterology

## 2017-09-15 VITALS — BP 140/90 | HR 76 | Ht 72.5 in | Wt 363.2 lb

## 2017-09-15 DIAGNOSIS — K760 Fatty (change of) liver, not elsewhere classified: Secondary | ICD-10-CM | POA: Diagnosis not present

## 2017-09-15 DIAGNOSIS — R140 Abdominal distension (gaseous): Secondary | ICD-10-CM

## 2017-09-15 DIAGNOSIS — K219 Gastro-esophageal reflux disease without esophagitis: Secondary | ICD-10-CM

## 2017-09-15 MED ORDER — PANTOPRAZOLE SODIUM 40 MG PO TBEC: 40.0000 mg | DELAYED_RELEASE_TABLET | Freq: Every day | ORAL | 1 refills | Status: DC

## 2017-09-15 NOTE — Progress Notes (Signed)
History of Present Illness: This is a 57 year old male complaining of postprandial abdominal bloating and tightness.  The symptoms have been present for several months they occur after most but not all meals.  Certain foods such as fried foods and nuts consistently bring out symptoms.  He notes occasional episodes of mild heartburn but these are not usually associated with his generalized abdominal bloating and tightness.  He states he has taken over-the-counter ranitidine on several occasions which has improved his symptoms.  His bowel movements are regular once or twice daily and have not changed.  Previous colonoscopy performed by Dr. Deatra Ina in 2015 showed 1 small adenomatous polyp.  Abdominal ultrasound in 2017 below.  Denies weight loss, abdominal pain, constipation, diarrhea, change in stool caliber, melena, hematochezia, nausea, vomiting, dysphagia, chest pain.  Abd [place] 01/2016  1. No gallstones or sonographic evidence of acute cholecystitis. If gallbladder dysfunction is suspected clinically, a nuclear medicine hepatobiliary scan may be useful. 2. Increased hepatic echotexture likely reflecting fatty infiltration. 3. Top normal splenic size which may be appropriate for the patient's body habitus. 4. Simple appearing cyst arising from the lower pole of the left kidney.  No Known Allergies Outpatient Medications Prior to Visit  Medication Sig Dispense Refill  . fluticasone (FLONASE) 50 MCG/ACT nasal spray Place 2 sprays into the nose daily as needed for allergies or rhinitis.     . Fluticasone Furoate-Vilanterol (BREO ELLIPTA) 100-25 MCG/INH AEPB Inhale 1 puff into the lungs daily. Rinse mouth 60 each 11  . Ibuprofen (ADVIL) 200 MG CAPS Take 400 mg by mouth once as needed (pain.).    Marland Kitchen lisinopril-hydrochlorothiazide (PRINZIDE,ZESTORETIC) 20-12.5 MG per tablet Take 1 tablet by mouth every morning.     . loratadine (CLARITIN) 10 MG tablet Take 10 mg by mouth as needed.     .  loratadine-pseudoephedrine (CLARITIN-D 12-HOUR) 5-120 MG per tablet Take 1 tablet by mouth daily as needed.    . Naproxen Sodium (ALEVE PO) Take 2 capsules by mouth once as needed (pain).     . NON FORMULARY Allergy Vaccine -Meg Whelan/twice a month per pt    . NON FORMULARY CPAP 11    . ranitidine (ZANTAC) 150 MG tablet Take 150 mg by mouth as needed for heartburn.    . temazepam (RESTORIL) 15 MG capsule TAKE ONE CAPSULE AT BED- TIME AS NEEDED FOR SLEEP. 30 capsule 5  . testosterone cypionate (DEPOTESTOSTERONE CYPIONATE) 200 MG/ML injection Inject 200 mg into the muscle every 21 ( twenty-one) days.    . VENTOLIN HFA 108 (90 Base) MCG/ACT inhaler USE 2 PUFFS EVERY 4 TO 6 HOURS AS NEEDED FOR COUGH/WHEEZING. 18 g 2  . vitamin C (ASCORBIC ACID) 500 MG tablet Take 500 mg by mouth once as needed.     . cholecalciferol (VITAMIN D) 1000 UNITS tablet Take 1,000 Units by mouth daily.     No facility-administered medications prior to visit.    Past Medical History:  Diagnosis Date  . Acute bronchitis   . Allergy   . Morbid obesity ([place]) 06/02/2008   Qualifier: Diagnosis of  By: Julien Girt CMA, Leigh    . Obesity, unspecified   . Obstructive sleep apnea (adult) (pediatric)   . Tubular adenoma of colon 11/2008  . Unspecified essential hypertension    Past Surgical History:  Procedure Laterality Date  . COLONOSCOPY WITH PROPOFOL N/A 05/31/2014   Procedure: COLONOSCOPY WITH PROPOFOL;  Surgeon: Inda Castle, MD;  Location: [facility];  Service: Endoscopy;  Laterality:  N/A;  . LASER ABLATION  03/28/2014, 05/16/2014   left leg, right leg  . VARICOCELE EXCISION     Social History   Socioeconomic History  . Marital status: Single    Spouse name: None  . Number of children: 0  . Years of education: None  . Highest education level: None  Social Needs  . Financial resource strain: None  . Food insecurity - worry: None  . Food insecurity - inability: None  . Transportation needs - medical: None   . Transportation needs - non-medical: None  Occupational History  . 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 Smoking status: Never Smoker  . Smokeless tobacco: Never Used  Substance and Sexual Activity  . Alcohol use: Yes    Comment: rarely  . Drug use: No  . Sexual activity: None  Other Topics Concern  . None  Social History Narrative  . None   Family History  Problem Relation Age of Onset  . Diabetes Mother   . Pancreatic cancer Maternal Grandmother        Physical Exam: General: Well developed, well nourished, obese, no acute distress Head: Normocephalic and atraumatic Eyes:  sclerae anicteric, EOMI Ears: Normal auditory acuity Mouth: No deformity or lesions Lungs: Clear throughout to auscultation Heart: Regular rate and rhythm; no murmurs, rubs or bruits Abdomen: Soft, large, non tender and non distended. No masses, hepatosplenomegaly or hernias noted. Normal Bowel sounds Rectal: not done Musculoskeletal: Symmetrical with no gross deformities  Pulses:  Normal pulses noted Extremities: No clubbing, cyanosis, edema or deformities noted Neurological: Alert oriented x 4, grossly nonfocal Psychological:  Alert and cooperative. Normal mood and affect  Assessment and Recommendations:  1. Post prandial abdominal bloating.  Rule out GERD, cholelithiasis.  Trial of pantoprazole 40 mg daily for 4-[redacted] weeks along with standard antireflux measures.  Gas-X 4 times daily as needed.  If symptoms have not resolved we will proceed with abdominal ultrasound and EGD for further evaluation.  Request blood work from recent evaluation by Dr. Joya Salm.  2. Personal history of adenomatous colon polyps.  5-year interval surveillance colonoscopy is recommended in October 2020.  3. Hepatic steatosis on [place].  Long-term weight loss program with a carb modified fat modified diet supervised by his PCP is recommended.  4. Obesity, BMI=48.  A long-term weight loss program  supervised by his PCP was recommended.

## 2017-09-15 NOTE — Patient Instructions (Signed)
We have sent the following medications to your pharmacy for you to pick up at your convenience: pantoprazole.   Patient advised to avoid spicy, acidic, citrus, chocolate, mints, fruit and fruit juices.  Limit the intake of caffeine, alcohol and Soda.  Don't exercise too soon after eating.  Don't lie down within 3-4 hours of eating.  Elevate the head of your bed.  Normal BMI (Body Mass Index- based on height and weight) is between 19 and 25. Your BMI today is Body mass index is 48.59 kg/m. Marland Kitchen Please consider follow up  regarding your BMI with your Primary Care Provider.  Thank you for choosing me and [facility].  Pricilla Riffle. Dagoberto Ligas., MD., Marval Regal

## 2017-09-16 DIAGNOSIS — E291 Testicular hypofunction: Secondary | ICD-10-CM | POA: Diagnosis not present

## 2017-09-19 DIAGNOSIS — J3089 Other allergic rhinitis: Secondary | ICD-10-CM | POA: Diagnosis not present

## 2017-09-19 DIAGNOSIS — J301 Allergic rhinitis due to pollen: Secondary | ICD-10-CM | POA: Diagnosis not present

## 2017-10-02 DIAGNOSIS — J301 Allergic rhinitis due to pollen: Secondary | ICD-10-CM | POA: Diagnosis not present

## 2017-10-02 DIAGNOSIS — J3089 Other allergic rhinitis: Secondary | ICD-10-CM | POA: Diagnosis not present

## 2017-10-14 DIAGNOSIS — J301 Allergic rhinitis due to pollen: Secondary | ICD-10-CM | POA: Diagnosis not present

## 2017-10-14 DIAGNOSIS — J3089 Other allergic rhinitis: Secondary | ICD-10-CM | POA: Diagnosis not present

## 2017-10-14 DIAGNOSIS — E291 Testicular hypofunction: Secondary | ICD-10-CM | POA: Diagnosis not present

## 2017-10-22 ENCOUNTER — Encounter (INDEPENDENT_AMBULATORY_CARE_PROVIDER_SITE_OTHER): Payer: BLUE CROSS/BLUE SHIELD

## 2017-10-22 DIAGNOSIS — I868 Varicose veins of other specified sites: Secondary | ICD-10-CM

## 2017-10-28 ENCOUNTER — Ambulatory Visit: Payer: BLUE CROSS/BLUE SHIELD | Admitting: Gastroenterology

## 2017-10-30 ENCOUNTER — Encounter: Payer: Self-pay | Admitting: Gastroenterology

## 2017-10-30 ENCOUNTER — Ambulatory Visit: Payer: BLUE CROSS/BLUE SHIELD | Admitting: Gastroenterology

## 2017-10-30 VITALS — BP 122/78 | HR 82 | Ht 75.0 in | Wt 357.4 lb

## 2017-10-30 DIAGNOSIS — K760 Fatty (change of) liver, not elsewhere classified: Secondary | ICD-10-CM

## 2017-10-30 DIAGNOSIS — K219 Gastro-esophageal reflux disease without esophagitis: Secondary | ICD-10-CM | POA: Diagnosis not present

## 2017-10-30 MED ORDER — PANTOPRAZOLE SODIUM 40 MG PO TBEC: 40.0000 mg | DELAYED_RELEASE_TABLET | Freq: Every day | ORAL | 3 refills | Status: DC

## 2017-10-30 NOTE — Progress Notes (Signed)
    History of Present Illness: This is a 57 year old male returning for follow-up of postprandial abdominal bloating and tightness.  With antireflux measures and pantoprazole 40 mg daily his symptoms have resolved.  He feels well.  He would like to continue on pantoprazole.  He has questions about long-term safety of PPIs and long-term management of his symptoms.  Current Medications, Allergies, Past Medical History, Past Surgical History, Family History and Social History were reviewed in [employer] record.  Physical Exam: General: Well developed, well nourished, no acute distress Head: Normocephalic and atraumatic Eyes:  sclerae anicteric, EOMI Ears: Normal auditory acuity Mouth: No deformity or lesions Lungs: Clear throughout to auscultation Heart: Regular rate and rhythm; no murmurs, rubs or bruits Abdomen: Soft, non tender and non distended. No masses, hepatosplenomegaly or hernias noted. Normal Bowel sounds Musculoskeletal: Symmetrical with no gross deformities  Pulses:  Normal pulses noted Extremities: No clubbing, cyanosis, edema or deformities noted Neurological: Alert oriented x 4, grossly nonfocal Psychological:  Alert and cooperative. Normal mood and affect  Assessment and Recommendations:  1.  Suspected GERD and food intolerances.  Continue antireflux measures and pantoprazole 40 mg daily.  Continue Gas-X 4 times daily as needed.  Continue avoiding fried foods, nuts and any other foods that exacerbate symptoms.  We discussed long-term potential risks and benefits of PPIs.  Advised him to use pantoprazole for 2 months daily and then attempt to wean and discontinue.  If symptoms return retry ranitidine and if not effective resume pantoprazole daily.  If symptoms do not remain under very good control will plan to proceed with EGD.  2.  Personal history of adenomatous colon polyps.  5-year interval colonoscopy for surveillance is recommended in October  2020.  3. Hepatic steatosis. BMI=48 Long-term weight loss program with a fat modified, carb modified diet supervised by PCP.

## 2017-10-30 NOTE — Patient Instructions (Signed)
If you are age 59 or older, your body mass index should be between 23-30. Your Body mass index is 44.67 kg/m. If this is out of the aforementioned range listed, please consider follow up with your Primary Care Provider.  If you are age 35 or younger, your body mass index should be between 19-25. Your Body mass index is 44.67 kg/m. If this is out of the aformentioned range listed, please consider follow up with your Primary Care Provider.   We have sent the following medications to your pharmacy for you to pick up at your convenience: Pantoprazole 40mg  once daily.   Thank you for choosing [facility]  Dr Fuller Plan

## 2017-11-04 DIAGNOSIS — J3089 Other allergic rhinitis: Secondary | ICD-10-CM | POA: Diagnosis not present

## 2017-11-04 DIAGNOSIS — J301 Allergic rhinitis due to pollen: Secondary | ICD-10-CM | POA: Diagnosis not present

## 2017-11-06 DIAGNOSIS — E291 Testicular hypofunction: Secondary | ICD-10-CM | POA: Diagnosis not present

## 2017-11-17 DIAGNOSIS — J301 Allergic rhinitis due to pollen: Secondary | ICD-10-CM | POA: Diagnosis not present

## 2017-11-17 DIAGNOSIS — J3089 Other allergic rhinitis: Secondary | ICD-10-CM | POA: Diagnosis not present

## 2017-12-04 DIAGNOSIS — J3089 Other allergic rhinitis: Secondary | ICD-10-CM | POA: Diagnosis not present

## 2017-12-04 DIAGNOSIS — E291 Testicular hypofunction: Secondary | ICD-10-CM | POA: Diagnosis not present

## 2017-12-04 DIAGNOSIS — T63421D Toxic effect of venom of ants, accidental (unintentional), subsequent encounter: Secondary | ICD-10-CM | POA: Diagnosis not present

## 2017-12-15 DIAGNOSIS — J301 Allergic rhinitis due to pollen: Secondary | ICD-10-CM | POA: Diagnosis not present

## 2017-12-15 DIAGNOSIS — J3089 Other allergic rhinitis: Secondary | ICD-10-CM | POA: Diagnosis not present

## 2017-12-30 DIAGNOSIS — G4733 Obstructive sleep apnea (adult) (pediatric): Secondary | ICD-10-CM | POA: Diagnosis not present

## 2018-01-01 DIAGNOSIS — E291 Testicular hypofunction: Secondary | ICD-10-CM | POA: Diagnosis not present

## 2018-01-08 DIAGNOSIS — E291 Testicular hypofunction: Secondary | ICD-10-CM | POA: Diagnosis not present

## 2018-01-08 DIAGNOSIS — R7302 Impaired glucose tolerance (oral): Secondary | ICD-10-CM | POA: Diagnosis not present

## 2018-01-08 DIAGNOSIS — J3089 Other allergic rhinitis: Secondary | ICD-10-CM | POA: Diagnosis not present

## 2018-01-08 DIAGNOSIS — J301 Allergic rhinitis due to pollen: Secondary | ICD-10-CM | POA: Diagnosis not present

## 2018-01-08 DIAGNOSIS — D751 Secondary polycythemia: Secondary | ICD-10-CM | POA: Diagnosis not present

## 2018-01-08 DIAGNOSIS — J309 Allergic rhinitis, unspecified: Secondary | ICD-10-CM | POA: Diagnosis not present

## 2018-01-27 DIAGNOSIS — M19012 Primary osteoarthritis, left shoulder: Secondary | ICD-10-CM | POA: Diagnosis not present

## 2018-01-28 DIAGNOSIS — J301 Allergic rhinitis due to pollen: Secondary | ICD-10-CM | POA: Diagnosis not present

## 2018-01-28 DIAGNOSIS — J3089 Other allergic rhinitis: Secondary | ICD-10-CM | POA: Diagnosis not present

## 2018-01-29 DIAGNOSIS — D0462 Carcinoma in situ of skin of left upper limb, including shoulder: Secondary | ICD-10-CM | POA: Diagnosis not present

## 2018-01-29 DIAGNOSIS — D485 Neoplasm of uncertain behavior of skin: Secondary | ICD-10-CM | POA: Diagnosis not present

## 2018-01-29 DIAGNOSIS — L570 Actinic keratosis: Secondary | ICD-10-CM | POA: Diagnosis not present

## 2018-01-29 DIAGNOSIS — D0461 Carcinoma in situ of skin of right upper limb, including shoulder: Secondary | ICD-10-CM | POA: Diagnosis not present

## 2018-01-29 DIAGNOSIS — E291 Testicular hypofunction: Secondary | ICD-10-CM | POA: Diagnosis not present

## 2018-01-29 DIAGNOSIS — D1801 Hemangioma of skin and subcutaneous tissue: Secondary | ICD-10-CM | POA: Diagnosis not present

## 2018-01-29 DIAGNOSIS — L718 Other rosacea: Secondary | ICD-10-CM | POA: Diagnosis not present

## 2018-02-10 DIAGNOSIS — J301 Allergic rhinitis due to pollen: Secondary | ICD-10-CM | POA: Diagnosis not present

## 2018-02-10 DIAGNOSIS — J3089 Other allergic rhinitis: Secondary | ICD-10-CM | POA: Diagnosis not present

## 2018-02-19 DIAGNOSIS — E291 Testicular hypofunction: Secondary | ICD-10-CM | POA: Diagnosis not present

## 2018-02-25 DIAGNOSIS — J3089 Other allergic rhinitis: Secondary | ICD-10-CM | POA: Diagnosis not present

## 2018-02-25 DIAGNOSIS — J301 Allergic rhinitis due to pollen: Secondary | ICD-10-CM | POA: Diagnosis not present

## 2018-03-24 DIAGNOSIS — E291 Testicular hypofunction: Secondary | ICD-10-CM | POA: Diagnosis not present

## 2018-03-25 DIAGNOSIS — J301 Allergic rhinitis due to pollen: Secondary | ICD-10-CM | POA: Diagnosis not present

## 2018-03-25 DIAGNOSIS — J3089 Other allergic rhinitis: Secondary | ICD-10-CM | POA: Diagnosis not present

## 2018-04-10 DIAGNOSIS — J3089 Other allergic rhinitis: Secondary | ICD-10-CM | POA: Diagnosis not present

## 2018-04-10 DIAGNOSIS — J301 Allergic rhinitis due to pollen: Secondary | ICD-10-CM | POA: Diagnosis not present

## 2018-04-17 DIAGNOSIS — J301 Allergic rhinitis due to pollen: Secondary | ICD-10-CM | POA: Diagnosis not present

## 2018-04-17 DIAGNOSIS — J3089 Other allergic rhinitis: Secondary | ICD-10-CM | POA: Diagnosis not present

## 2018-04-28 DIAGNOSIS — J301 Allergic rhinitis due to pollen: Secondary | ICD-10-CM | POA: Diagnosis not present

## 2018-04-28 DIAGNOSIS — J3089 Other allergic rhinitis: Secondary | ICD-10-CM | POA: Diagnosis not present

## 2018-05-09 DIAGNOSIS — Z23 Encounter for immunization: Secondary | ICD-10-CM | POA: Diagnosis not present

## 2018-05-27 DIAGNOSIS — J3089 Other allergic rhinitis: Secondary | ICD-10-CM | POA: Diagnosis not present

## 2018-05-27 DIAGNOSIS — J301 Allergic rhinitis due to pollen: Secondary | ICD-10-CM | POA: Diagnosis not present

## 2018-05-28 ENCOUNTER — Other Ambulatory Visit: Payer: Self-pay | Admitting: Internal Medicine

## 2018-05-29 DIAGNOSIS — J301 Allergic rhinitis due to pollen: Secondary | ICD-10-CM | POA: Diagnosis not present

## 2018-05-29 DIAGNOSIS — J3089 Other allergic rhinitis: Secondary | ICD-10-CM | POA: Diagnosis not present

## 2018-06-04 DIAGNOSIS — J3089 Other allergic rhinitis: Secondary | ICD-10-CM | POA: Diagnosis not present

## 2018-06-04 DIAGNOSIS — J301 Allergic rhinitis due to pollen: Secondary | ICD-10-CM | POA: Diagnosis not present

## 2018-06-10 DIAGNOSIS — J3089 Other allergic rhinitis: Secondary | ICD-10-CM | POA: Diagnosis not present

## 2018-06-10 DIAGNOSIS — J301 Allergic rhinitis due to pollen: Secondary | ICD-10-CM | POA: Diagnosis not present

## 2018-06-12 DIAGNOSIS — J301 Allergic rhinitis due to pollen: Secondary | ICD-10-CM | POA: Diagnosis not present

## 2018-06-12 DIAGNOSIS — J3089 Other allergic rhinitis: Secondary | ICD-10-CM | POA: Diagnosis not present

## 2018-06-17 DIAGNOSIS — Z85828 Personal history of other malignant neoplasm of skin: Secondary | ICD-10-CM | POA: Diagnosis not present

## 2018-06-17 DIAGNOSIS — D1801 Hemangioma of skin and subcutaneous tissue: Secondary | ICD-10-CM | POA: Diagnosis not present

## 2018-06-17 DIAGNOSIS — L821 Other seborrheic keratosis: Secondary | ICD-10-CM | POA: Diagnosis not present

## 2018-06-17 DIAGNOSIS — L570 Actinic keratosis: Secondary | ICD-10-CM | POA: Diagnosis not present

## 2018-06-17 DIAGNOSIS — D225 Melanocytic nevi of trunk: Secondary | ICD-10-CM | POA: Diagnosis not present

## 2018-06-24 DIAGNOSIS — J3089 Other allergic rhinitis: Secondary | ICD-10-CM | POA: Diagnosis not present

## 2018-06-24 DIAGNOSIS — J301 Allergic rhinitis due to pollen: Secondary | ICD-10-CM | POA: Diagnosis not present

## 2018-07-06 DIAGNOSIS — J301 Allergic rhinitis due to pollen: Secondary | ICD-10-CM | POA: Diagnosis not present

## 2018-07-06 DIAGNOSIS — J3089 Other allergic rhinitis: Secondary | ICD-10-CM | POA: Diagnosis not present

## 2018-07-06 DIAGNOSIS — R05 Cough: Secondary | ICD-10-CM | POA: Diagnosis not present

## 2018-07-07 DIAGNOSIS — N529 Male erectile dysfunction, unspecified: Secondary | ICD-10-CM | POA: Diagnosis not present

## 2018-07-07 DIAGNOSIS — N401 Enlarged prostate with lower urinary tract symptoms: Secondary | ICD-10-CM | POA: Diagnosis not present

## 2018-07-07 DIAGNOSIS — E291 Testicular hypofunction: Secondary | ICD-10-CM | POA: Diagnosis not present

## 2018-07-07 DIAGNOSIS — N400 Enlarged prostate without lower urinary tract symptoms: Secondary | ICD-10-CM | POA: Diagnosis not present

## 2018-07-08 ENCOUNTER — Encounter: Payer: Self-pay | Admitting: Internal Medicine

## 2018-07-08 ENCOUNTER — Ambulatory Visit: Payer: BLUE CROSS/BLUE SHIELD | Admitting: Internal Medicine

## 2018-07-08 VITALS — BP 122/80 | HR 86 | Ht 75.0 in | Wt 346.4 lb

## 2018-07-08 DIAGNOSIS — I10 Essential (primary) hypertension: Secondary | ICD-10-CM | POA: Diagnosis not present

## 2018-07-08 DIAGNOSIS — G4733 Obstructive sleep apnea (adult) (pediatric): Secondary | ICD-10-CM | POA: Diagnosis not present

## 2018-07-08 DIAGNOSIS — Z0000 Encounter for general adult medical examination without abnormal findings: Secondary | ICD-10-CM | POA: Diagnosis not present

## 2018-07-08 DIAGNOSIS — R7302 Impaired glucose tolerance (oral): Secondary | ICD-10-CM | POA: Diagnosis not present

## 2018-07-08 DIAGNOSIS — R82998 Other abnormal findings in urine: Secondary | ICD-10-CM | POA: Diagnosis not present

## 2018-07-08 DIAGNOSIS — Z125 Encounter for screening for malignant neoplasm of prostate: Secondary | ICD-10-CM | POA: Diagnosis not present

## 2018-07-08 NOTE — Patient Instructions (Signed)
Order- [facility]ME [facility]- please replace supplies- filters, tubing, mask of choice. Continue 11 cwp, humidifier, AirView/ download card  Please call if we can help

## 2018-07-08 NOTE — Assessment & Plan Note (Signed)
He has dieted off a few pounds and is encouraged to continue.

## 2018-07-08 NOTE — Progress Notes (Signed)
HPI male never smoker followed for OSA, bronchitis, complicated by allergic rhinitis/ asthma( Dr Orvil Feil), morbid obesity, HBP,  NPSG 10/21/97-AHI 40/hour, desaturation to 80%, body weight 320 pounds ------------------------------------------------------------------------------------------- 07/18/17- 57 year old male never smoker followed for OSA, bronchitis, complicated by allergic rhinitis( Dr Orvil Feil), morbid obesity CPAP 11/[facility] OSA; DME: [facility]. Pt feels his pressure needs to be at 10.5 on CPAP. Currently on 10. DL attached. Pt will need order for supplies as well.  Download 90% compliance, AHI 0.6/hour. Occasional temazepam for insomnia but overall sleep quality good. Weight today 351 pounds, up 21 pounds since his original NPSG  07/08/2018- 57 year old male never smoker followed for OSA, bronchitis, complicated by allergic rhinitis( Dr Orvil Feil), morbid obesity CPAP 11/[facility] -----OSA; DME [facility]. Pt wears CPAP nightly and DL attached. Pt will need new supplies.  Weight today 346 pounds Breo 100, Ventolin HFA, Flonase, His asthmatic bronchitis is managed by his allergy office.  He denies recent problems but does ask my opinion about room air filters and I advised looking for HEPA designation. He is very satisfied with his CPAP, continuing to sleep well.  Download confirms with compliance 100%, AHI 0.5/hour.  He needs replacement supplies.  Machine is about 59 years old.  ROS-see HPI + = positive Constitutional:   No-  unexplained weight loss, night sweats, fevers, chills, fatigue, lassitude. HEENT:   No-  headaches, difficulty swallowing, tooth/dental problems, sore throat,       No-  sneezing, itching, ear ache, nasal congestion, post nasal drip,  CV:  No-   chest pain, orthopnea, PND, swelling in lower extremities, anasarca, dizziness, palpitations Resp: No-  acute shortness of breath with exertion or at rest.              No-   productive cough,  + non-productive cough,  No- coughing up of  blood.              No-   change in color of mucus.  No- wheezing.   Skin: No-   rash or lesions. GI:  No-   heartburn, indigestion, abdominal pain, nausea, vomiting, GU:  MS:  No-   joint pain or swelling.  Neuro-     nothing unusual Psych:  No- change in mood or affect. No depression or anxiety.  No memory loss.  OBJ- Physical Exam General- Alert, Oriented, Affect-appropriate, Distress- none acute, morbidly obese Skin- rash-none, lesions- none, excoriation- none, + plethoric Lymphadenopathy- none Head- atraumatic            Eyes- Gross vision intact, PERRLA, conjunctivae and secretions clear, ? proptosis            Ears- Hearing, canals-normal            Nose- Clear, no-Septal dev, mucus, polyps, erosion, perforation             Throat- Mallampati III , mucosa clear , drainage- none, tonsils- atrophic Neck- flexible , trachea midline, no stridor , thyroid nl, carotid no bruit Chest - symmetrical excursion , unlabored           Heart/CV- RRR , no murmur , no gallop  , no rub, nl s1 s2                           - JVD- none , edema- none, stasis changes- none, varices- none           Lung- clear to P&A, wheeze- none, cough- none , dullness-none, rub- none  Chest wall-  Abd-  Br/ Gen/ Rectal- Not done, not indicated Extrem- cyanosis- none, clubbing, none, atrophy- none, strength- nl Neuro- grossly intact to observation

## 2018-07-08 NOTE — Assessment & Plan Note (Signed)
Continues to benefit from CPAP with improved sleep and reports no concerns.  Pressure is comfortable. Plan-continue CPAP 11.  Request replacement supplies.

## 2018-07-10 DIAGNOSIS — M1990 Unspecified osteoarthritis, unspecified site: Secondary | ICD-10-CM | POA: Insufficient documentation

## 2018-07-14 ENCOUNTER — Other Ambulatory Visit (INDEPENDENT_AMBULATORY_CARE_PROVIDER_SITE_OTHER): Payer: BLUE CROSS/BLUE SHIELD

## 2018-07-14 ENCOUNTER — Ambulatory Visit (INDEPENDENT_AMBULATORY_CARE_PROVIDER_SITE_OTHER): Payer: BLUE CROSS/BLUE SHIELD | Admitting: Physician Assistant

## 2018-07-14 ENCOUNTER — Encounter: Payer: Self-pay | Admitting: Physician Assistant

## 2018-07-14 VITALS — BP 118/84 | HR 72 | Ht 75.0 in | Wt 341.5 lb

## 2018-07-14 DIAGNOSIS — R1013 Epigastric pain: Secondary | ICD-10-CM | POA: Diagnosis not present

## 2018-07-14 DIAGNOSIS — Z1212 Encounter for screening for malignant neoplasm of rectum: Secondary | ICD-10-CM | POA: Diagnosis not present

## 2018-07-14 DIAGNOSIS — R140 Abdominal distension (gaseous): Secondary | ICD-10-CM

## 2018-07-14 DIAGNOSIS — M19012 Primary osteoarthritis, left shoulder: Secondary | ICD-10-CM | POA: Diagnosis not present

## 2018-07-14 LAB — COMPREHENSIVE METABOLIC PANEL
ALT: 26 U/L (ref 0–53)
AST: 23 U/L (ref 0–37)
Albumin: 4.3 g/dL (ref 3.5–5.2)
Alkaline Phosphatase: 61 U/L (ref 39–117)
BUN: 16 mg/dL (ref 6–23)
CO2: 29 mEq/L (ref 19–32)
Calcium: 10.2 mg/dL (ref 8.4–10.5)
Chloride: 100 mEq/L (ref 96–112)
Creatinine, Ser: 0.99 mg/dL (ref 0.40–1.50)
GFR: 81.98 mL/min (ref >60.00)
Glucose, Bld: 104 mg/dL — ABNORMAL HIGH (ref 70–99)
Potassium: 4.1 mEq/L (ref 3.5–5.1)
Sodium: 136 mEq/L (ref 135–145)
Total Bilirubin: 0.8 mg/dL (ref 0.2–1.2)
Total Protein: 7.7 g/dL (ref 6.0–8.3)

## 2018-07-14 LAB — CBC WITH DIFFERENTIAL/PLATELET
Basophils Absolute: 0.1 10*3/uL (ref 0.0–0.1)
Basophils Relative: 0.7 % (ref 0.0–3.0)
Eosinophils Absolute: 0.2 10*3/uL (ref 0.0–0.7)
Eosinophils Relative: 2.3 % (ref 0.0–5.0)
HCT: 48.5 % (ref 39.0–52.0)
Hemoglobin: 16.6 g/dL (ref 13.0–17.0)
LYMPHS ABS: 1.3 10*3/uL (ref 0.7–4.0)
Lymphocytes Relative: 15.6 % (ref 12.0–46.0)
MCHC: 34.3 g/dL (ref 30.0–36.0)
MCV: 91.6 fl (ref 78.0–100.0)
Monocytes Absolute: 0.9 10*3/uL (ref 0.1–1.0)
Monocytes Relative: 11 % (ref 3.0–12.0)
Neutro Abs: 5.7 10*3/uL (ref 1.4–7.7)
Neutrophils Relative %: 70.4 % (ref 43.0–77.0)
PLATELETS: 212 10*3/uL (ref 150.0–400.0)
RBC: 5.29 Mil/uL (ref 4.22–5.81)
RDW: 13.6 % (ref 11.5–15.5)
WBC: 8 10*3/uL (ref 4.0–10.5)

## 2018-07-14 LAB — LIPASE: LIPASE: 40 U/L (ref 11.0–59.0)

## 2018-07-14 MED ORDER — PANTOPRAZOLE SODIUM 40 MG PO TBEC: DELAYED_RELEASE_TABLET | ORAL | 2 refills | Status: DC

## 2018-07-14 NOTE — Patient Instructions (Addendum)
Your provider has requested that you go to the basement level for lab work before leaving today. Press "B" on the elevator. The lab is located at the first door on the left as you exit the elevator.  We sent refills for the Pantoprazole sodium 40 mg.  Call if no better in 2 weeks.  Made a follow up appointment with Ellouise Newer PA for 08-12-2018 at 10:15 am.  Normal BMI (Body Mass Index- based on height and weight) is between 19 and 25. Your BMI today is Body mass index is 42.68 kg/m. Marland Kitchen Please consider follow up  regarding your BMI with your Primary Care Provider.

## 2018-07-14 NOTE — Progress Notes (Signed)
Chief Complaint: GERD, Epigastric pain  HPI:    Casey Roach is a 57 year old male, known to Dr. Fuller Plan, who presents to clinic today with right-sided abdominal tightness and bloating with reflux.    10/30/2017 saw Dr. Fuller Plan for follow-up of postprandial abdominal bloating and tightness.  At that time, with antireflux measures and Pantoprazole 40 mg daily his symptoms had resolved.  He did have come some concerns regarding long-term use of PPIs and  it was recommended he stay on his Pantoprazole for 2 months daily and then attempt to wean and discontinue, suggested if his symptoms return he could try Ranitidine and if not effective resume Pantoprazole.  He has a personal history of adenomatous polyps with recommendation for repeat colonoscopy in October 2020. It was also discussed that if his epigastric discomfort and problems continued would recommend an EGD for further eval.    Today, the patient presents clinic and explains that over the past week or so he has had an increase in "abdominal tightness" which he describes as being along his right side and into his epigastrium.  He notes that he has only been using his Pantoprazole and Ranitidine as needed.  He did use his Pantoprazole about 2 days out of the week last week and tells me that this morning he is actually feeling some better from his symptoms.  Patient is worried in regards to problems with his gallbladder, pancreas or other.  He does recall feeling better on the Pantoprazole when taking it daily but again discusses the possible side effects from this medication.    Also describes increased urination over the past week or so.  Does tell me he drinks a lot of water.    Denies fever, chills, weight loss, anorexia, nausea, vomiting or symptoms that awaken him from sleep.  Past Medical History:  Diagnosis Date  . Acute bronchitis   . Allergy   . Morbid obesity ([place]) 06/02/2008   Qualifier: Diagnosis of  By: Julien Girt CMA, Leigh    . Obesity,  unspecified   . Obstructive sleep apnea (adult) (pediatric)   . Tubular adenoma of colon 11/2008  . Unspecified essential hypertension     Past Surgical History:  Procedure Laterality Date  . COLONOSCOPY WITH PROPOFOL N/A 05/31/2014   Procedure: COLONOSCOPY WITH PROPOFOL;  Surgeon: Inda Castle, MD;  Location: [facility];  Service: Endoscopy;  Laterality: N/A;  . LASER ABLATION  03/28/2014, 05/16/2014   left leg, right leg  . VARICOCELE EXCISION      Current Outpatient Medications  Medication Sig Dispense Refill  . fluticasone (FLONASE) 50 MCG/ACT nasal spray Place 2 sprays into the nose daily as needed for allergies or rhinitis.     . Fluticasone Furoate-Vilanterol (BREO ELLIPTA) 100-25 MCG/INH AEPB Inhale 1 puff into the lungs daily. Rinse mouth 60 each 11  . Ibuprofen (ADVIL) 200 MG CAPS Take 400 mg by mouth once as needed (pain.).    Marland Kitchen lisinopril-hydrochlorothiazide (PRINZIDE,ZESTORETIC) 20-12.5 MG per tablet Take 1 tablet by mouth every morning.     . Naproxen Sodium (ALEVE PO) Take 2 capsules by mouth once as needed (pain).     . NON FORMULARY Allergy Vaccine -Meg Whelan/twice a month per pt    . NON FORMULARY CPAP 11    . pantoprazole (PROTONIX) 40 MG tablet Take 1 tablet by mouth daily, for 1-2 months. 30 tablet 2  . temazepam (RESTORIL) 15 MG capsule TAKE ONE CAPSULE AT BED- TIME AS NEEDED FOR SLEEP. 30 capsule 5  .  testosterone cypionate (DEPOTESTOSTERONE CYPIONATE) 200 MG/ML injection Inject 200 mg into the muscle every 21 ( twenty-one) days.    . VENTOLIN HFA 108 (90 Base) MCG/ACT inhaler USE 2 PUFFS EVERY 4 TO 6 HOURS AS NEEDED FOR COUGH/WHEEZING. 18 g 0  . vitamin C (ASCORBIC ACID) 500 MG tablet Take 500 mg by mouth once as needed.      No current facility-administered medications for this visit.     Allergies as of 07/14/2018  . (No Known Allergies)    Family History  Problem Relation Age of Onset  . Diabetes Mother   . Pancreatic cancer Maternal  Grandmother     Social History   Socioeconomic History  . Marital status: Single    Spouse name: Not on file  . Number of children: 0  . Years of education: Not on file  . Highest education level: Not on file  Occupational History  . 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 Financial resource strain: Not on file  . Food insecurity:    Worry: Not on file    Inability: Not on file  . Transportation needs:    Medical: Not on file    Non-medical: Not on file  Tobacco Use  . Smoking status: Never Smoker  . Smokeless tobacco: Never Used  Substance and Sexual Activity  . Alcohol use: Yes    Comment: rarely  . Drug use: No  . Sexual activity: Not on file  Lifestyle  . Physical activity:    Days per week: Not on file    Minutes per session: Not on file  . Stress: Not on file  Relationships  . Social connections:    Talks on phone: Not on file    Gets together: Not on file    Attends religious service: Not on file    Active member of club or organization: Not on file    Attends meetings of clubs or organizations: Not on file    Relationship status: Not on file  . Intimate partner violence:    Fear of current or ex partner: Not on file    Emotionally abused: Not on file    Physically abused: Not on file    Forced sexual activity: Not on file  Other Topics Concern  . Not on file  Social History Narrative  . Not on file    Review of Systems:    Constitutional: No weight loss, fever or chills Cardiovascular: No chest pain Respiratory: No SOB  Gastrointestinal: See HPI and otherwise negative   Physical Exam:  Vital signs: BP 118/84   Pulse 72   Ht 6\' 3"  (1.905 m)   Wt (!) 341 lb 8 oz (154.9 kg)   BMI 42.68 kg/m   Constitutional:   Pleasant obese Caucasian male appears to be in NAD, Well developed, Well nourished, alert and cooperative Respiratory: Respirations even and unlabored. Lungs clear to auscultation bilaterally.   No wheezes,  crackles, or rhonchi.  Cardiovascular: Normal S1, S2. No MRG. Regular rate and rhythm. No peripheral edema, cyanosis or pallor.  Gastrointestinal:  Soft, nondistended, moderate epigastric and RUQ ttp. No rebound or guarding. Normal bowel sounds. No appreciable masses or hepatomegaly. Rectal:  Not performed.  Psychiatric:  Demonstrates good judgement and reason without abnormal affect or behaviors.  Assessment: 1.  Epigastric pain: Described as tightness, similar symptoms in March had cleared up with Pantoprazole; suspected reflux, likely the same 2.  Bloating/GERD: Above  Plan: 1.  Reassured  patient that likely this is just a continuation of his same symptoms for which he saw Dr. Fuller Plan earlier this year. 2.  Ordered labs to include a CBC, CMP and lipase. 3.  Would recommend the patient start his Pantoprazole 40 mg once daily 30-60 minutes before breakfast for the next 3 to 4 weeks. 4.  If the patient is not feeling any better after 2 weeks of consistently using this medication then would recommend he call our clinic.  At that time would recommend further imaging, likely right upper quadrant ultrasound. 5.  Patient will follow in clinic with me in 3 to 4 weeks or sooner if necessary.  Ellouise Newer, PA-C [facility] 07/14/2018, 1:24 PM  Cc: Burnard Bunting, MD

## 2018-07-14 NOTE — Progress Notes (Signed)
Reviewed and agree with initial management plan.  Santi Troung T. Oreoluwa Gilmer, MD FACG 

## 2018-07-15 ENCOUNTER — Encounter: Payer: BLUE CROSS/BLUE SHIELD | Admitting: Vascular Surgery

## 2018-07-15 DIAGNOSIS — I8390 Asymptomatic varicose veins of unspecified lower extremity: Secondary | ICD-10-CM | POA: Diagnosis not present

## 2018-07-15 DIAGNOSIS — R7302 Impaired glucose tolerance (oral): Secondary | ICD-10-CM | POA: Diagnosis not present

## 2018-07-15 DIAGNOSIS — Z1389 Encounter for screening for other disorder: Secondary | ICD-10-CM | POA: Diagnosis not present

## 2018-07-15 DIAGNOSIS — Z0000 Encounter for general adult medical examination without abnormal findings: Secondary | ICD-10-CM | POA: Diagnosis not present

## 2018-07-15 DIAGNOSIS — G4730 Sleep apnea, unspecified: Secondary | ICD-10-CM | POA: Diagnosis not present

## 2018-07-15 DIAGNOSIS — Z23 Encounter for immunization: Secondary | ICD-10-CM | POA: Diagnosis not present

## 2018-07-15 DIAGNOSIS — I10 Essential (primary) hypertension: Secondary | ICD-10-CM | POA: Diagnosis not present

## 2018-07-17 ENCOUNTER — Ambulatory Visit: Payer: BLUE CROSS/BLUE SHIELD | Admitting: Physician Assistant

## 2018-07-20 ENCOUNTER — Telehealth: Payer: Self-pay | Admitting: Physician Assistant

## 2018-07-20 DIAGNOSIS — R1013 Epigastric pain: Secondary | ICD-10-CM

## 2018-07-20 DIAGNOSIS — R1011 Right upper quadrant pain: Secondary | ICD-10-CM

## 2018-07-20 NOTE — Telephone Encounter (Signed)
Per the pt he states that his insurance is changing after the first of the year and he will no longer be covered for imaging.  He would like to know if we can go ahead and order the [place] that was mentioned at last office visit.  See below;   " If the patient is not feeling any better after 2 weeks of consistently using this medication then would recommend he call our clinic.  At that time would recommend further imaging, likely right upper quadrant ultrasound. "

## 2018-07-20 NOTE — Telephone Encounter (Signed)
The patient has been notified of this information and all questions answered. The pt has been advised of the information and verbalized understanding.       You have been scheduled for an abdominal ultrasound at [facility] (1st floor of hospital) on 07/24/18 at 930 am. Please arrive 15 minutes prior to your appointment for registration. Make certain not to have anything to eat or drink 6 hours prior to your appointment. Should you need to reschedule your appointment, please contact radiology at [phone]. This test typically takes about 30 minutes to perform.

## 2018-07-20 NOTE — Telephone Encounter (Signed)
That is fine. Thanks-JLL  

## 2018-07-20 NOTE — Telephone Encounter (Signed)
Patient wants to speak to nurse regarding last weeks visit with [name]

## 2018-07-21 DIAGNOSIS — J301 Allergic rhinitis due to pollen: Secondary | ICD-10-CM | POA: Diagnosis not present

## 2018-07-21 DIAGNOSIS — E291 Testicular hypofunction: Secondary | ICD-10-CM | POA: Diagnosis not present

## 2018-07-21 DIAGNOSIS — J3089 Other allergic rhinitis: Secondary | ICD-10-CM | POA: Diagnosis not present

## 2018-07-22 DIAGNOSIS — H5203 Hypermetropia, bilateral: Secondary | ICD-10-CM | POA: Diagnosis not present

## 2018-07-22 DIAGNOSIS — H3500 Unspecified background retinopathy: Secondary | ICD-10-CM | POA: Diagnosis not present

## 2018-07-22 DIAGNOSIS — H524 Presbyopia: Secondary | ICD-10-CM | POA: Diagnosis not present

## 2018-07-24 ENCOUNTER — Ambulatory Visit (HOSPITAL_COMMUNITY)
Admission: RE | Admit: 2018-07-24 | Discharge: 2018-07-24 | Disposition: A | Discharge status: Home / Self Care | Payer: BLUE CROSS/BLUE SHIELD | Source: Ambulatory Visit | Attending: Physician Assistant | Admitting: Physician Assistant

## 2018-07-24 DIAGNOSIS — K760 Fatty (change of) liver, not elsewhere classified: Secondary | ICD-10-CM | POA: Diagnosis not present

## 2018-07-24 DIAGNOSIS — R1011 Right upper quadrant pain: Secondary | ICD-10-CM | POA: Diagnosis not present

## 2018-07-24 DIAGNOSIS — R1013 Epigastric pain: Secondary | ICD-10-CM | POA: Diagnosis not present

## 2018-07-27 ENCOUNTER — Ambulatory Visit (INDEPENDENT_AMBULATORY_CARE_PROVIDER_SITE_OTHER): Payer: BLUE CROSS/BLUE SHIELD | Admitting: Podiatry

## 2018-07-27 ENCOUNTER — Encounter: Payer: Self-pay | Admitting: Podiatry

## 2018-07-27 ENCOUNTER — Ambulatory Visit (INDEPENDENT_AMBULATORY_CARE_PROVIDER_SITE_OTHER): Payer: BLUE CROSS/BLUE SHIELD

## 2018-07-27 VITALS — BP 151/98 | HR 85 | Resp 16

## 2018-07-27 DIAGNOSIS — M722 Plantar fascial fibromatosis: Secondary | ICD-10-CM | POA: Diagnosis not present

## 2018-07-27 DIAGNOSIS — B351 Tinea unguium: Secondary | ICD-10-CM | POA: Diagnosis not present

## 2018-07-27 MED ORDER — TERBINAFINE HCL 250 MG PO TABS: 250.0000 mg | ORAL_TABLET | Freq: Every day | ORAL | 0 refills | Status: DC

## 2018-07-27 MED ORDER — TRIAMCINOLONE ACETONIDE 10 MG/ML IJ SUSP
10.0000 mg | Freq: Once | INTRAMUSCULAR | Status: AC
  Administered 2018-07-27: 10 mg

## 2018-07-27 NOTE — Patient Instructions (Signed)

## 2018-07-27 NOTE — Progress Notes (Signed)
   Subjective:    Patient ID: Casey Roach, male    DOB: 12/16/1960, 57 y.o.   MRN: [id number]  HPI    Review of Systems  Allergic/Immunologic: Positive for environmental allergies.  All other systems reviewed and are negative.      Objective:   Physical Exam        Assessment & Plan:

## 2018-07-28 NOTE — Progress Notes (Signed)
Subjective:   Patient ID: Casey Roach, male   DOB: 57 y.o.   MRN: [id number]   HPI Patient presents with painful heel on the left that is been going on for about 10 months and is worse when he gets up in the morning and also complains of nailbeds that are thickened yellowed and he would like to see what could be done to try to help this condition.  Is checked by his family physician and recently had liver function studies done that were normal   Review of Systems  All other systems reviewed and are negative.       Objective:  Physical Exam Vitals signs and nursing note reviewed.  Constitutional:      Appearance: He is well-developed.  Pulmonary:     Effort: Pulmonary effort is normal.  Musculoskeletal: Normal range of motion.  Skin:    General: Skin is warm.  Neurological:     Mental Status: He is alert.     Neurovascular status intact muscle strength is adequate range of motion within normal limits with patient noted to have thick yellow nailbeds of the hallux bilateral and several other nails that have moderate involvement.  Patient also has exquisite discomfort left plantar fascia and does have quite a bit of varicosities in the ankle and lower leg that he has evaluated and wears compression stockings and also was noted to have dry plantar skin which may have fungal component.  Good digital perfusion well oriented x3     Assessment:  Plantar fasciitis left with pain along with mycotic nail infection bilateral     Plan:  H&P condition reviewed and at this point I injected the plantar fascial left 3 mg Kenalog 5 mg Xylocaine and I discussed the nails are great detail and we are going to follow a fairly aggressive course of oral medication with consideration for pulse therapy in future along with laser therapy and topical medicine.  Patient will start the topical several months after the beginning of laser and will be worked on by Metallurgist.  Patient will be seen by me  for heel as needed  X-ray indicates spur formation with no indication of stress fracture arthritis

## 2018-08-01 DIAGNOSIS — G4733 Obstructive sleep apnea (adult) (pediatric): Secondary | ICD-10-CM | POA: Diagnosis not present

## 2018-08-04 DIAGNOSIS — J301 Allergic rhinitis due to pollen: Secondary | ICD-10-CM | POA: Diagnosis not present

## 2018-08-04 DIAGNOSIS — J3089 Other allergic rhinitis: Secondary | ICD-10-CM | POA: Diagnosis not present

## 2018-08-10 ENCOUNTER — Telehealth: Payer: Self-pay | Admitting: Physician Assistant

## 2018-08-10 NOTE — Telephone Encounter (Signed)
Casey Roach is requesting a copy of ct scan results that he had done recently mailed to his address on file.

## 2018-08-10 NOTE — Telephone Encounter (Signed)
Copy of [place] mailed as requested to the pt home.

## 2018-08-12 ENCOUNTER — Ambulatory Visit: Payer: BLUE CROSS/BLUE SHIELD | Admitting: Physician Assistant

## 2018-08-17 DIAGNOSIS — E291 Testicular hypofunction: Secondary | ICD-10-CM | POA: Diagnosis not present

## 2018-08-19 ENCOUNTER — Ambulatory Visit: Payer: Self-pay

## 2018-08-19 DIAGNOSIS — M722 Plantar fascial fibromatosis: Secondary | ICD-10-CM

## 2018-08-19 DIAGNOSIS — B351 Tinea unguium: Secondary | ICD-10-CM

## 2018-08-25 NOTE — Progress Notes (Signed)
Pt presents with mycotic infection of nails 1-5 bilateral.  All other systems are negative  Laser therapy administered to affected nails and tolerated well. All safety precautions were in place.  2nd treatment.  Follow up in 4 weeks     

## 2018-09-08 DIAGNOSIS — J3089 Other allergic rhinitis: Secondary | ICD-10-CM | POA: Diagnosis not present

## 2018-09-08 DIAGNOSIS — J301 Allergic rhinitis due to pollen: Secondary | ICD-10-CM | POA: Diagnosis not present

## 2018-09-17 DIAGNOSIS — E291 Testicular hypofunction: Secondary | ICD-10-CM | POA: Diagnosis not present

## 2018-09-29 DIAGNOSIS — J301 Allergic rhinitis due to pollen: Secondary | ICD-10-CM | POA: Diagnosis not present

## 2018-09-29 DIAGNOSIS — J3089 Other allergic rhinitis: Secondary | ICD-10-CM | POA: Diagnosis not present

## 2018-09-30 ENCOUNTER — Other Ambulatory Visit: Payer: BLUE CROSS/BLUE SHIELD

## 2018-10-28 ENCOUNTER — Other Ambulatory Visit: Payer: BLUE CROSS/BLUE SHIELD

## 2018-10-28 DIAGNOSIS — J3089 Other allergic rhinitis: Secondary | ICD-10-CM | POA: Diagnosis not present

## 2018-10-28 DIAGNOSIS — J301 Allergic rhinitis due to pollen: Secondary | ICD-10-CM | POA: Diagnosis not present

## 2018-11-17 DIAGNOSIS — J3089 Other allergic rhinitis: Secondary | ICD-10-CM | POA: Diagnosis not present

## 2018-11-17 DIAGNOSIS — J301 Allergic rhinitis due to pollen: Secondary | ICD-10-CM | POA: Diagnosis not present

## 2018-12-01 DIAGNOSIS — E291 Testicular hypofunction: Secondary | ICD-10-CM | POA: Diagnosis not present

## 2018-12-02 ENCOUNTER — Other Ambulatory Visit: Payer: BLUE CROSS/BLUE SHIELD

## 2018-12-07 DIAGNOSIS — J3089 Other allergic rhinitis: Secondary | ICD-10-CM | POA: Diagnosis not present

## 2018-12-07 DIAGNOSIS — J301 Allergic rhinitis due to pollen: Secondary | ICD-10-CM | POA: Diagnosis not present

## 2018-12-11 DIAGNOSIS — Z20828 Contact with and (suspected) exposure to other viral communicable diseases: Secondary | ICD-10-CM | POA: Diagnosis not present

## 2018-12-29 DIAGNOSIS — E291 Testicular hypofunction: Secondary | ICD-10-CM | POA: Diagnosis not present

## 2019-01-04 DIAGNOSIS — J3089 Other allergic rhinitis: Secondary | ICD-10-CM | POA: Diagnosis not present

## 2019-01-04 DIAGNOSIS — J301 Allergic rhinitis due to pollen: Secondary | ICD-10-CM | POA: Diagnosis not present

## 2019-01-25 DIAGNOSIS — J3089 Other allergic rhinitis: Secondary | ICD-10-CM | POA: Diagnosis not present

## 2019-01-25 DIAGNOSIS — J301 Allergic rhinitis due to pollen: Secondary | ICD-10-CM | POA: Diagnosis not present

## 2019-01-26 DIAGNOSIS — E291 Testicular hypofunction: Secondary | ICD-10-CM | POA: Diagnosis not present

## 2019-01-27 IMAGING — US US ABDOMEN LIMITED
1 series · 14 of 25 positions shown · non-contrast
Comparison: 01/29/2016

CLINICAL DATA: Right upper quadrant pain

EXAM:
ULTRASOUND ABDOMEN LIMITED RIGHT UPPER QUADRANT

[Series 1: us abdomen limited · 14 of 59 slices shown]
[im 1/59]
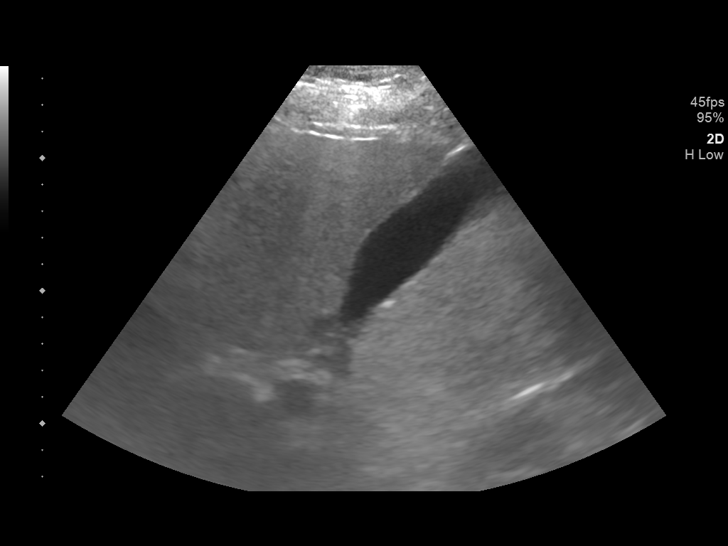
[im 5/59]
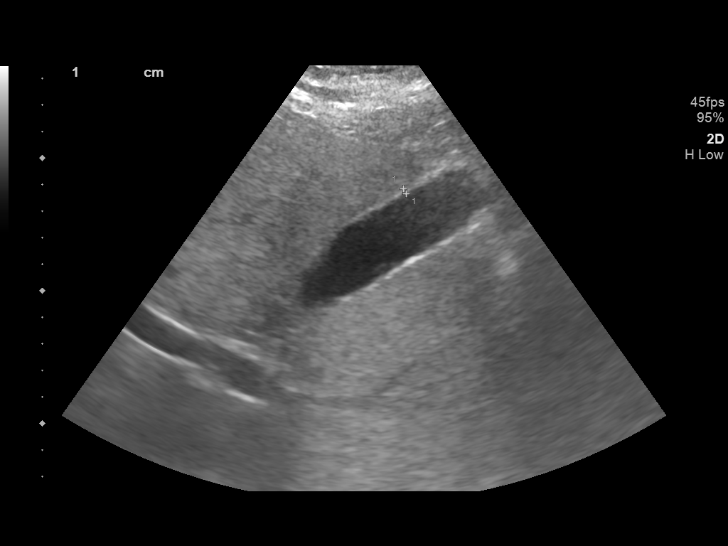
[im 10/59]
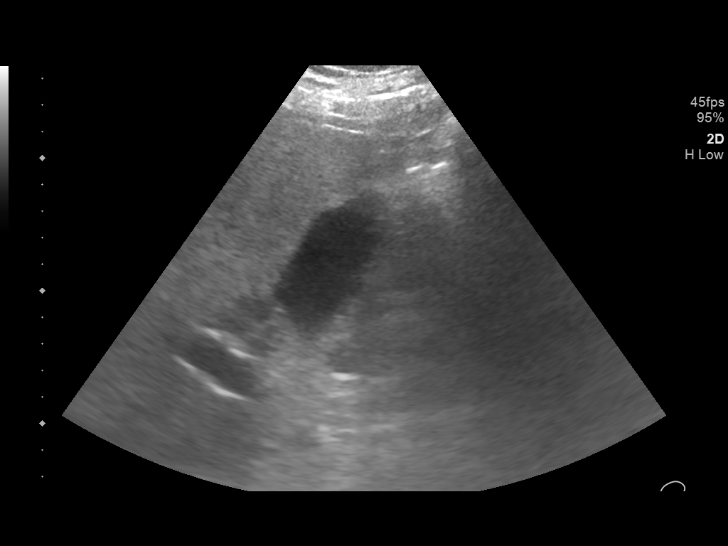
[im 15/59]
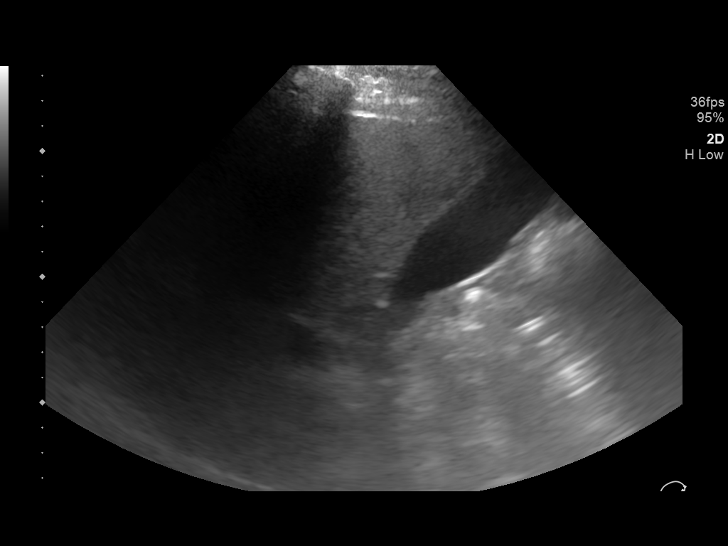
[im 20/59]
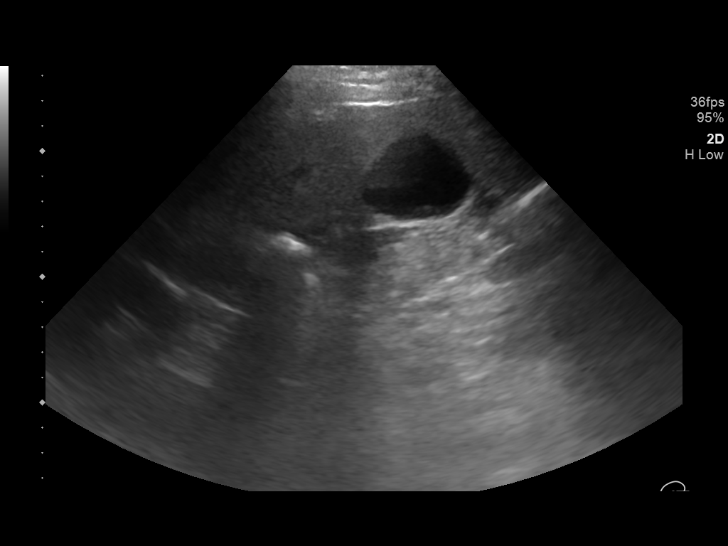
[im 22/59]
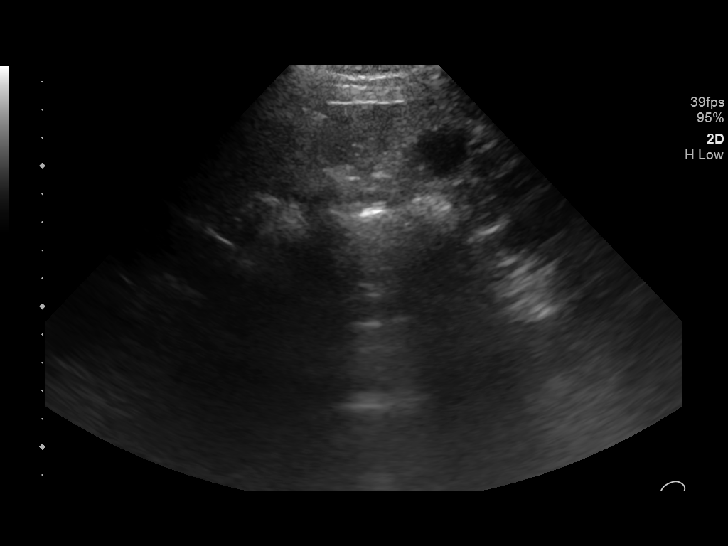
[im 27/59]
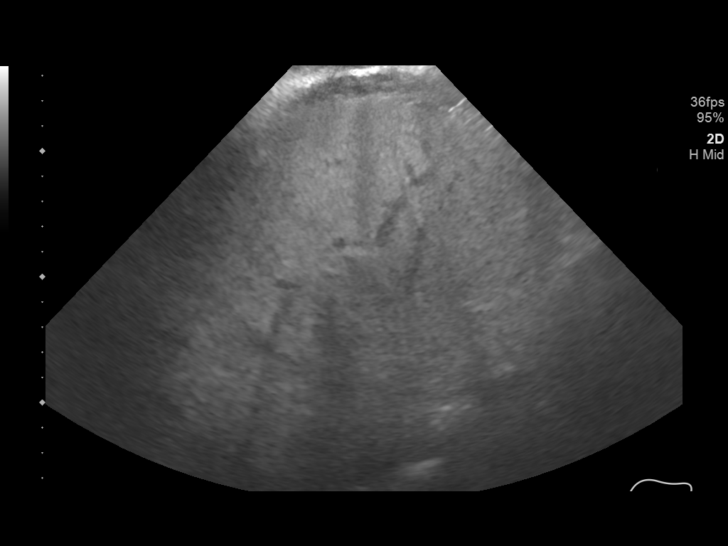
[im 32/59]
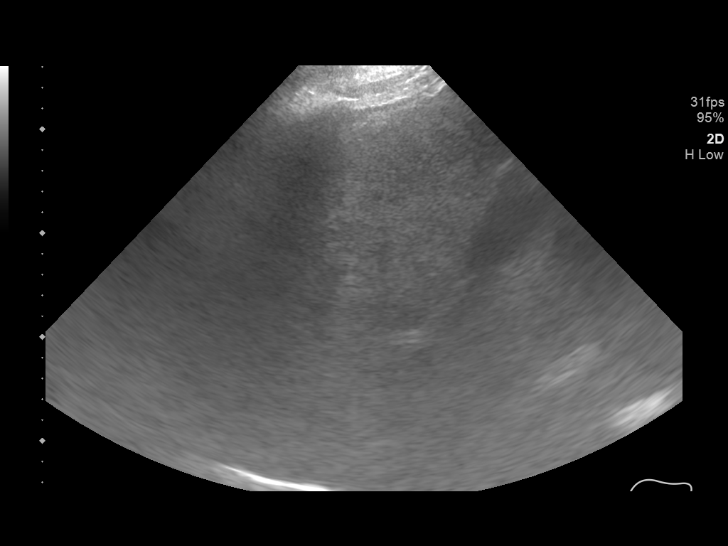
[im 37/59]
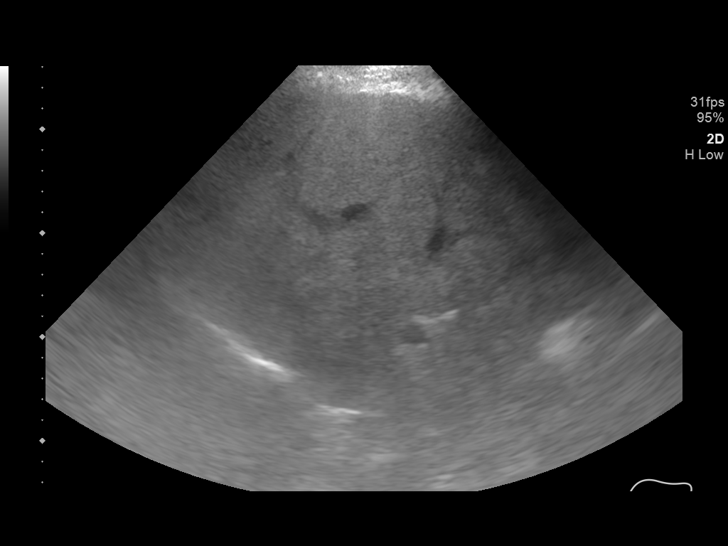
[im 39/59]
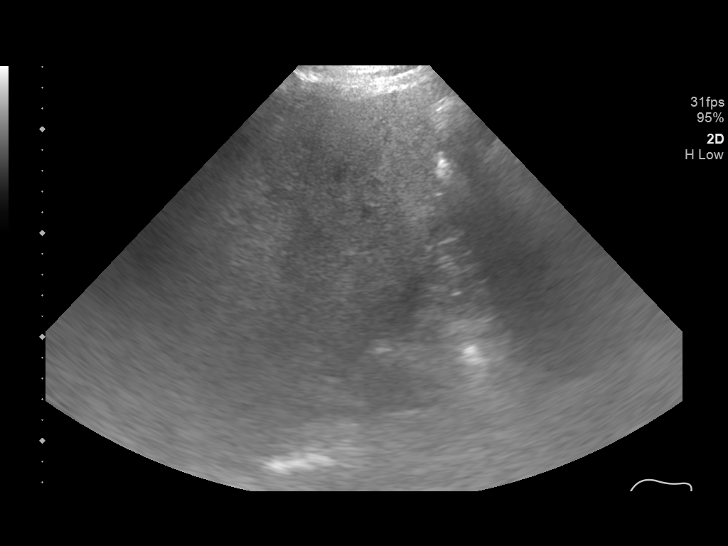
[im 44/59]
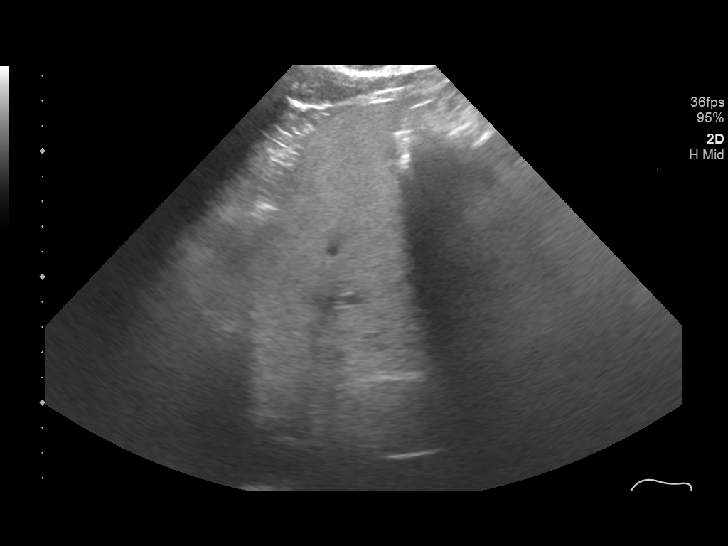
[im 49/59]
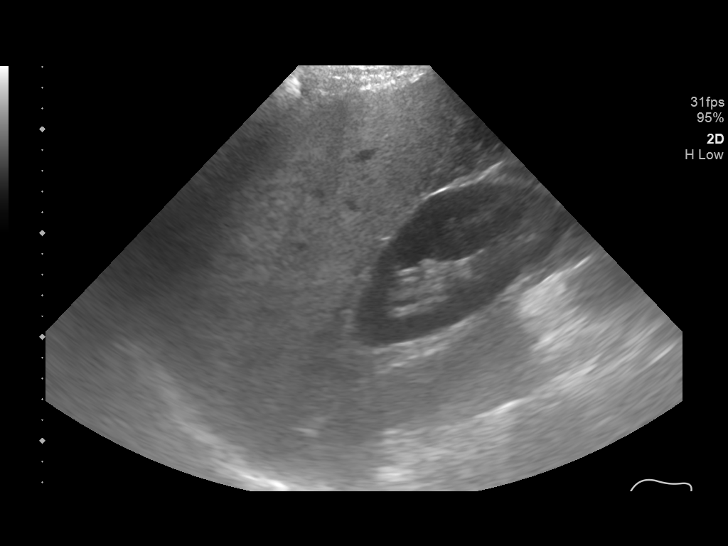
[im 54/59]
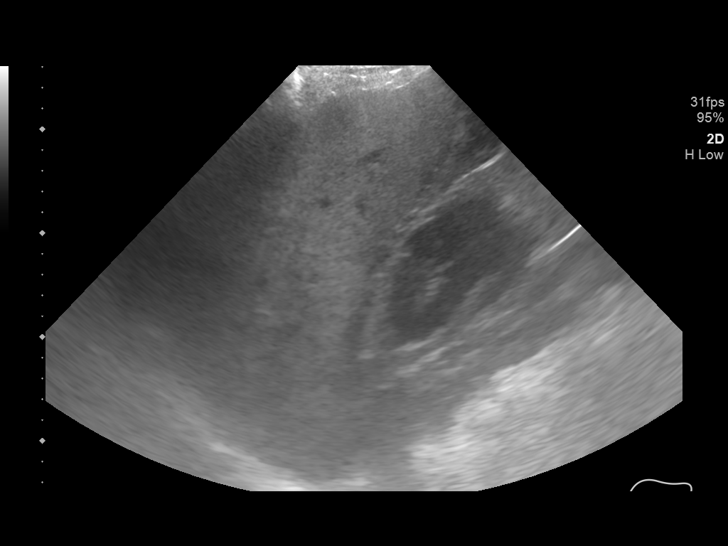
[im 59/59]
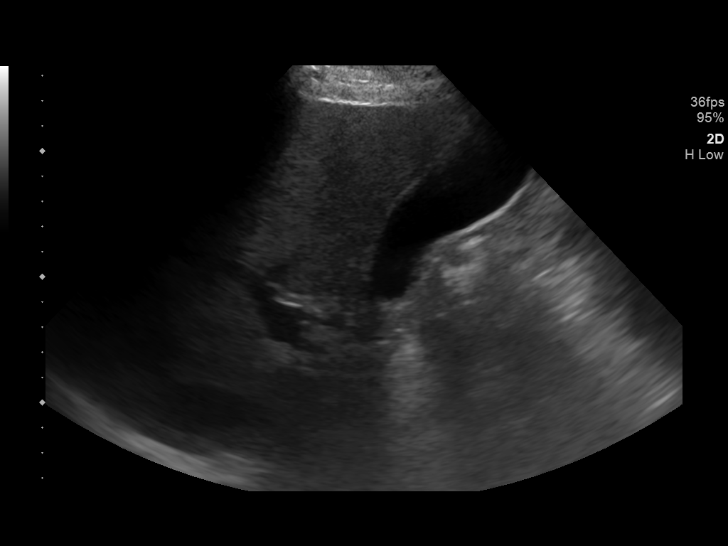

[14 of 25 positions shown; findings below may reference images not displayed]

FINDINGS: Gallbladder:

No gallstones or wall thickening visualized. No sonographic Murphy
sign noted by sonographer.

Common bile duct:

Diameter: Normal caliber, 3 mm where visualized.

Liver:

Increased echotexture compatible with fatty infiltration. No focal
abnormality or biliary ductal dilatation. Portal vein is patent on
color Doppler imaging with normal direction of blood flow towards
the liver.
IMPRESSION: Fatty infiltration of the liver.  No acute findings.

## 2019-02-10 DIAGNOSIS — J3089 Other allergic rhinitis: Secondary | ICD-10-CM | POA: Diagnosis not present

## 2019-02-10 DIAGNOSIS — J301 Allergic rhinitis due to pollen: Secondary | ICD-10-CM | POA: Diagnosis not present

## 2019-02-22 DIAGNOSIS — I10 Essential (primary) hypertension: Secondary | ICD-10-CM | POA: Diagnosis not present

## 2019-02-22 DIAGNOSIS — R600 Localized edema: Secondary | ICD-10-CM | POA: Diagnosis not present

## 2019-02-22 DIAGNOSIS — I8390 Asymptomatic varicose veins of unspecified lower extremity: Secondary | ICD-10-CM | POA: Diagnosis not present

## 2019-02-23 DIAGNOSIS — E291 Testicular hypofunction: Secondary | ICD-10-CM | POA: Diagnosis not present

## 2019-03-04 ENCOUNTER — Other Ambulatory Visit: Payer: Self-pay

## 2019-03-04 ENCOUNTER — Ambulatory Visit (HOSPITAL_COMMUNITY): Admission: RE | Admit: 2019-03-04 | Payer: BLUE CROSS/BLUE SHIELD | Source: Ambulatory Visit

## 2019-03-04 ENCOUNTER — Encounter (HOSPITAL_COMMUNITY): Payer: Self-pay

## 2019-03-04 DIAGNOSIS — L97919 Non-pressure chronic ulcer of unspecified part of right lower leg with unspecified severity: Secondary | ICD-10-CM

## 2019-03-04 DIAGNOSIS — I83219 Varicose veins of right lower extremity with both ulcer of unspecified site and inflammation: Secondary | ICD-10-CM

## 2019-03-04 DIAGNOSIS — L97929 Non-pressure chronic ulcer of unspecified part of left lower leg with unspecified severity: Secondary | ICD-10-CM

## 2019-03-10 DIAGNOSIS — J3089 Other allergic rhinitis: Secondary | ICD-10-CM | POA: Diagnosis not present

## 2019-03-10 DIAGNOSIS — J301 Allergic rhinitis due to pollen: Secondary | ICD-10-CM | POA: Diagnosis not present

## 2019-03-16 ENCOUNTER — Encounter: Payer: BLUE CROSS/BLUE SHIELD | Admitting: Vascular Surgery

## 2019-03-30 DIAGNOSIS — E291 Testicular hypofunction: Secondary | ICD-10-CM | POA: Diagnosis not present

## 2019-04-07 DIAGNOSIS — J301 Allergic rhinitis due to pollen: Secondary | ICD-10-CM | POA: Diagnosis not present

## 2019-04-07 DIAGNOSIS — J3089 Other allergic rhinitis: Secondary | ICD-10-CM | POA: Diagnosis not present

## 2019-04-08 DIAGNOSIS — M19012 Primary osteoarthritis, left shoulder: Secondary | ICD-10-CM | POA: Diagnosis not present

## 2019-04-08 DIAGNOSIS — M25512 Pain in left shoulder: Secondary | ICD-10-CM | POA: Diagnosis not present

## 2019-04-08 DIAGNOSIS — M25562 Pain in left knee: Secondary | ICD-10-CM | POA: Diagnosis not present

## 2019-04-19 DIAGNOSIS — J3089 Other allergic rhinitis: Secondary | ICD-10-CM | POA: Diagnosis not present

## 2019-04-19 DIAGNOSIS — J301 Allergic rhinitis due to pollen: Secondary | ICD-10-CM | POA: Diagnosis not present

## 2019-04-26 DIAGNOSIS — J3089 Other allergic rhinitis: Secondary | ICD-10-CM | POA: Diagnosis not present

## 2019-04-26 DIAGNOSIS — J301 Allergic rhinitis due to pollen: Secondary | ICD-10-CM | POA: Diagnosis not present

## 2019-04-27 DIAGNOSIS — E291 Testicular hypofunction: Secondary | ICD-10-CM | POA: Diagnosis not present

## 2019-04-29 DIAGNOSIS — J301 Allergic rhinitis due to pollen: Secondary | ICD-10-CM | POA: Diagnosis not present

## 2019-04-29 DIAGNOSIS — J3089 Other allergic rhinitis: Secondary | ICD-10-CM | POA: Diagnosis not present

## 2019-05-05 DIAGNOSIS — J301 Allergic rhinitis due to pollen: Secondary | ICD-10-CM | POA: Diagnosis not present

## 2019-05-05 DIAGNOSIS — J3089 Other allergic rhinitis: Secondary | ICD-10-CM | POA: Diagnosis not present

## 2019-05-08 DIAGNOSIS — Z23 Encounter for immunization: Secondary | ICD-10-CM | POA: Diagnosis not present

## 2019-05-11 DIAGNOSIS — J3089 Other allergic rhinitis: Secondary | ICD-10-CM | POA: Diagnosis not present

## 2019-05-11 DIAGNOSIS — J301 Allergic rhinitis due to pollen: Secondary | ICD-10-CM | POA: Diagnosis not present

## 2019-05-13 ENCOUNTER — Encounter: Payer: Self-pay | Admitting: Gastroenterology

## 2019-05-13 DIAGNOSIS — J3089 Other allergic rhinitis: Secondary | ICD-10-CM | POA: Diagnosis not present

## 2019-05-13 DIAGNOSIS — J301 Allergic rhinitis due to pollen: Secondary | ICD-10-CM | POA: Diagnosis not present

## 2019-05-27 DIAGNOSIS — N401 Enlarged prostate with lower urinary tract symptoms: Secondary | ICD-10-CM | POA: Diagnosis not present

## 2019-05-27 DIAGNOSIS — M10069 Idiopathic gout, unspecified knee: Secondary | ICD-10-CM | POA: Diagnosis not present

## 2019-05-27 DIAGNOSIS — R3912 Poor urinary stream: Secondary | ICD-10-CM | POA: Diagnosis not present

## 2019-05-27 DIAGNOSIS — E291 Testicular hypofunction: Secondary | ICD-10-CM | POA: Diagnosis not present

## 2019-05-27 DIAGNOSIS — N529 Male erectile dysfunction, unspecified: Secondary | ICD-10-CM | POA: Diagnosis not present

## 2019-05-31 DIAGNOSIS — J301 Allergic rhinitis due to pollen: Secondary | ICD-10-CM | POA: Diagnosis not present

## 2019-05-31 DIAGNOSIS — J3089 Other allergic rhinitis: Secondary | ICD-10-CM | POA: Diagnosis not present

## 2019-06-08 DIAGNOSIS — G4733 Obstructive sleep apnea (adult) (pediatric): Secondary | ICD-10-CM | POA: Diagnosis not present

## 2019-06-17 DIAGNOSIS — J301 Allergic rhinitis due to pollen: Secondary | ICD-10-CM | POA: Diagnosis not present

## 2019-06-17 DIAGNOSIS — J3089 Other allergic rhinitis: Secondary | ICD-10-CM | POA: Diagnosis not present

## 2019-06-21 DIAGNOSIS — J309 Allergic rhinitis, unspecified: Secondary | ICD-10-CM | POA: Diagnosis not present

## 2019-06-21 DIAGNOSIS — H6123 Impacted cerumen, bilateral: Secondary | ICD-10-CM | POA: Diagnosis not present

## 2019-06-21 DIAGNOSIS — H6983 Other specified disorders of Eustachian tube, bilateral: Secondary | ICD-10-CM | POA: Diagnosis not present

## 2019-06-21 DIAGNOSIS — H9190 Unspecified hearing loss, unspecified ear: Secondary | ICD-10-CM | POA: Diagnosis not present

## 2019-06-22 DIAGNOSIS — Z87891 Personal history of nicotine dependence: Secondary | ICD-10-CM | POA: Diagnosis not present

## 2019-06-22 DIAGNOSIS — H6122 Impacted cerumen, left ear: Secondary | ICD-10-CM | POA: Diagnosis not present

## 2019-06-22 DIAGNOSIS — H9202 Otalgia, left ear: Secondary | ICD-10-CM | POA: Diagnosis not present

## 2019-06-23 DIAGNOSIS — L738 Other specified follicular disorders: Secondary | ICD-10-CM | POA: Diagnosis not present

## 2019-06-23 DIAGNOSIS — Z85828 Personal history of other malignant neoplasm of skin: Secondary | ICD-10-CM | POA: Diagnosis not present

## 2019-06-23 DIAGNOSIS — L570 Actinic keratosis: Secondary | ICD-10-CM | POA: Diagnosis not present

## 2019-06-23 DIAGNOSIS — D1801 Hemangioma of skin and subcutaneous tissue: Secondary | ICD-10-CM | POA: Diagnosis not present

## 2019-06-23 DIAGNOSIS — L821 Other seborrheic keratosis: Secondary | ICD-10-CM | POA: Diagnosis not present

## 2019-07-08 ENCOUNTER — Telehealth: Payer: Self-pay | Admitting: Internal Medicine

## 2019-07-08 DIAGNOSIS — J3089 Other allergic rhinitis: Secondary | ICD-10-CM | POA: Diagnosis not present

## 2019-07-08 DIAGNOSIS — R05 Cough: Secondary | ICD-10-CM | POA: Diagnosis not present

## 2019-07-08 DIAGNOSIS — J301 Allergic rhinitis due to pollen: Secondary | ICD-10-CM | POA: Diagnosis not present

## 2019-07-08 NOTE — Telephone Encounter (Signed)
Ok to reschedule f/u until next year

## 2019-07-08 NOTE — Telephone Encounter (Signed)
Spoke with patient. He stated that he has an extremely high copay for specialty visits this year. He wanted to know if he could postpone his OV until sometime next year. He stated that he is doing well on his CPAP. Does not feel the need to change any settings. He has supplies. Also denied any other breathing issues.   Dr. Annamaria Boots, please advise. Thanks!

## 2019-07-09 NOTE — Telephone Encounter (Signed)
Attempted to call pt but unable to reach. Left message for pt to return call. 

## 2019-07-12 ENCOUNTER — Ambulatory Visit: Payer: BLUE CROSS/BLUE SHIELD | Admitting: Internal Medicine

## 2019-07-12 DIAGNOSIS — I10 Essential (primary) hypertension: Secondary | ICD-10-CM | POA: Diagnosis not present

## 2019-07-12 DIAGNOSIS — R7302 Impaired glucose tolerance (oral): Secondary | ICD-10-CM | POA: Diagnosis not present

## 2019-07-12 DIAGNOSIS — Z125 Encounter for screening for malignant neoplasm of prostate: Secondary | ICD-10-CM | POA: Diagnosis not present

## 2019-07-13 NOTE — Telephone Encounter (Signed)
Pt's OV which was originally scheduled for 12/7 was cancelled. Nothing further needed.

## 2019-07-19 DIAGNOSIS — Z1331 Encounter for screening for depression: Secondary | ICD-10-CM | POA: Diagnosis not present

## 2019-07-19 DIAGNOSIS — I10 Essential (primary) hypertension: Secondary | ICD-10-CM | POA: Diagnosis not present

## 2019-07-19 DIAGNOSIS — R82998 Other abnormal findings in urine: Secondary | ICD-10-CM | POA: Diagnosis not present

## 2019-07-19 DIAGNOSIS — D751 Secondary polycythemia: Secondary | ICD-10-CM | POA: Diagnosis not present

## 2019-07-19 DIAGNOSIS — Z0000 Encounter for general adult medical examination without abnormal findings: Secondary | ICD-10-CM | POA: Diagnosis not present

## 2019-07-19 DIAGNOSIS — R7302 Impaired glucose tolerance (oral): Secondary | ICD-10-CM | POA: Diagnosis not present

## 2019-07-19 DIAGNOSIS — R600 Localized edema: Secondary | ICD-10-CM | POA: Diagnosis not present

## 2019-07-27 DIAGNOSIS — J3089 Other allergic rhinitis: Secondary | ICD-10-CM | POA: Diagnosis not present

## 2019-07-27 DIAGNOSIS — Z1212 Encounter for screening for malignant neoplasm of rectum: Secondary | ICD-10-CM | POA: Diagnosis not present

## 2019-07-27 DIAGNOSIS — J301 Allergic rhinitis due to pollen: Secondary | ICD-10-CM | POA: Diagnosis not present

## 2019-08-08 ENCOUNTER — Other Ambulatory Visit: Payer: Self-pay | Admitting: Physician Assistant

## 2019-08-18 DIAGNOSIS — J3089 Other allergic rhinitis: Secondary | ICD-10-CM | POA: Diagnosis not present

## 2019-08-18 DIAGNOSIS — J301 Allergic rhinitis due to pollen: Secondary | ICD-10-CM | POA: Diagnosis not present

## 2019-09-09 DIAGNOSIS — J3089 Other allergic rhinitis: Secondary | ICD-10-CM | POA: Diagnosis not present

## 2019-09-09 DIAGNOSIS — J301 Allergic rhinitis due to pollen: Secondary | ICD-10-CM | POA: Diagnosis not present

## 2019-09-15 DIAGNOSIS — R21 Rash and other nonspecific skin eruption: Secondary | ICD-10-CM | POA: Diagnosis not present

## 2019-09-15 DIAGNOSIS — J301 Allergic rhinitis due to pollen: Secondary | ICD-10-CM | POA: Diagnosis not present

## 2019-09-15 DIAGNOSIS — R05 Cough: Secondary | ICD-10-CM | POA: Diagnosis not present

## 2019-09-15 DIAGNOSIS — J3089 Other allergic rhinitis: Secondary | ICD-10-CM | POA: Diagnosis not present

## 2019-09-22 DIAGNOSIS — L718 Other rosacea: Secondary | ICD-10-CM | POA: Diagnosis not present

## 2019-09-22 DIAGNOSIS — L309 Dermatitis, unspecified: Secondary | ICD-10-CM | POA: Diagnosis not present

## 2019-10-04 ENCOUNTER — Other Ambulatory Visit: Payer: Self-pay

## 2019-10-04 ENCOUNTER — Other Ambulatory Visit (HOSPITAL_COMMUNITY): Payer: Self-pay | Admitting: Internal Medicine

## 2019-10-04 ENCOUNTER — Ambulatory Visit (HOSPITAL_COMMUNITY)
Admission: RE | Admit: 2019-10-04 | Discharge: 2019-10-04 | Disposition: A | Discharge status: Home / Self Care | Payer: BC Managed Care – PPO | Source: Ambulatory Visit | Attending: Internal Medicine | Admitting: Internal Medicine

## 2019-10-04 ENCOUNTER — Telehealth: Payer: Self-pay

## 2019-10-04 DIAGNOSIS — M79604 Pain in right leg: Secondary | ICD-10-CM

## 2019-10-04 DIAGNOSIS — M7989 Other specified soft tissue disorders: Secondary | ICD-10-CM

## 2019-10-04 DIAGNOSIS — I8390 Asymptomatic varicose veins of unspecified lower extremity: Secondary | ICD-10-CM | POA: Diagnosis not present

## 2019-10-04 DIAGNOSIS — R600 Localized edema: Secondary | ICD-10-CM | POA: Diagnosis not present

## 2019-10-04 NOTE — Progress Notes (Signed)
Right lower extremity venous duplex exam completed.  Preliminary results can be found under CV proc under chart review.  10/04/2019 4:30 PM  Anette Barra, K., RDMS, RVT

## 2019-10-04 NOTE — Telephone Encounter (Signed)
Mr. Casey Roach left a voicemail message at around 10 am that he was having leg and calf pain.  I returned his call for more details at around 11:30 am to gather more details.  The patient said that he was scheduled to see his primary care physician today.  I assured him that Dr. Reynaldo Minium was a referring physician who would refer him to our office if needed. He voiced understanding.  I reviewed his chart at 5 pm and noted that he was scheduled and arrived for a DVT study today at 4 pm, ordered by Dr. Reynaldo Minium.  Leilani Merl

## 2019-10-11 DIAGNOSIS — H10413 Chronic giant papillary conjunctivitis, bilateral: Secondary | ICD-10-CM | POA: Diagnosis not present

## 2019-10-11 DIAGNOSIS — J3089 Other allergic rhinitis: Secondary | ICD-10-CM | POA: Diagnosis not present

## 2019-10-11 DIAGNOSIS — H16103 Unspecified superficial keratitis, bilateral: Secondary | ICD-10-CM | POA: Diagnosis not present

## 2019-10-11 DIAGNOSIS — H04123 Dry eye syndrome of bilateral lacrimal glands: Secondary | ICD-10-CM | POA: Diagnosis not present

## 2019-10-11 DIAGNOSIS — J301 Allergic rhinitis due to pollen: Secondary | ICD-10-CM | POA: Diagnosis not present

## 2019-10-11 DIAGNOSIS — H524 Presbyopia: Secondary | ICD-10-CM | POA: Diagnosis not present

## 2019-10-20 DIAGNOSIS — J301 Allergic rhinitis due to pollen: Secondary | ICD-10-CM | POA: Diagnosis not present

## 2019-10-20 DIAGNOSIS — J3089 Other allergic rhinitis: Secondary | ICD-10-CM | POA: Diagnosis not present

## 2019-10-26 DIAGNOSIS — J3089 Other allergic rhinitis: Secondary | ICD-10-CM | POA: Diagnosis not present

## 2019-10-26 DIAGNOSIS — J301 Allergic rhinitis due to pollen: Secondary | ICD-10-CM | POA: Diagnosis not present

## 2019-10-27 DIAGNOSIS — I8393 Asymptomatic varicose veins of bilateral lower extremities: Secondary | ICD-10-CM

## 2019-10-28 ENCOUNTER — Ambulatory Visit: Payer: BC Managed Care – PPO | Attending: Internal Medicine

## 2019-10-28 DIAGNOSIS — Z23 Encounter for immunization: Secondary | ICD-10-CM

## 2019-10-28 NOTE — Progress Notes (Signed)
   Covid-19 Vaccination Clinic  Name:  Casey Roach    MRN: [id number] DOB: 1961-07-16  10/28/2019  Mr. Casey Roach was observed post Covid-19 immunization for 15 minutes without incident. He was provided with Vaccine Information Sheet and instruction to access the V-Safe system.   Mr. Casey Roach was instructed to call 911 with any severe reactions post vaccine: Marland Kitchen Difficulty breathing  . Swelling of face and throat  . A fast heartbeat  . A bad rash all over body  . Dizziness and weakness   Immunizations Administered    Name Date Dose VIS Date Route   Pfizer COVID-19 Vaccine 10/28/2019  9:57 AM 0.3 mL 07/16/2019 Intramuscular   Manufacturer: [place]   Lot: [id number]   [place]: [id number]

## 2019-11-01 DIAGNOSIS — J301 Allergic rhinitis due to pollen: Secondary | ICD-10-CM | POA: Diagnosis not present

## 2019-11-01 DIAGNOSIS — J3089 Other allergic rhinitis: Secondary | ICD-10-CM | POA: Diagnosis not present

## 2019-11-17 DIAGNOSIS — J3089 Other allergic rhinitis: Secondary | ICD-10-CM | POA: Diagnosis not present

## 2019-11-17 DIAGNOSIS — J301 Allergic rhinitis due to pollen: Secondary | ICD-10-CM | POA: Diagnosis not present

## 2019-11-19 DIAGNOSIS — J301 Allergic rhinitis due to pollen: Secondary | ICD-10-CM | POA: Diagnosis not present

## 2019-11-19 DIAGNOSIS — J3089 Other allergic rhinitis: Secondary | ICD-10-CM | POA: Diagnosis not present

## 2019-11-22 ENCOUNTER — Ambulatory Visit: Payer: BC Managed Care – PPO | Attending: Internal Medicine

## 2019-11-22 DIAGNOSIS — Z23 Encounter for immunization: Secondary | ICD-10-CM

## 2019-11-22 NOTE — Progress Notes (Signed)
   Covid-19 Vaccination Clinic  Name:  Casey Roach    MRN: [id number] DOB: 1961-03-06  11/22/2019  Casey Roach was observed post Covid-19 immunization for 15 minutes without incident. He was provided with Vaccine Information Sheet and instruction to access the V-Safe system.   Casey Roach was instructed to call 911 with any severe reactions post vaccine: Marland Kitchen Difficulty breathing  . Swelling of face and throat  . A fast heartbeat  . A bad rash all over body  . Dizziness and weakness   Immunizations Administered    Name Date Dose VIS Date Route   Pfizer COVID-19 Vaccine 11/22/2019  9:28 AM 0.3 mL 09/29/2018 Intramuscular   Manufacturer: [place]   Lot: [id number]   [place]: [id number]

## 2019-11-30 DIAGNOSIS — J3089 Other allergic rhinitis: Secondary | ICD-10-CM | POA: Diagnosis not present

## 2019-11-30 DIAGNOSIS — J301 Allergic rhinitis due to pollen: Secondary | ICD-10-CM | POA: Diagnosis not present

## 2019-12-23 DIAGNOSIS — J3089 Other allergic rhinitis: Secondary | ICD-10-CM | POA: Diagnosis not present

## 2019-12-23 DIAGNOSIS — J301 Allergic rhinitis due to pollen: Secondary | ICD-10-CM | POA: Diagnosis not present

## 2019-12-27 DIAGNOSIS — M25512 Pain in left shoulder: Secondary | ICD-10-CM | POA: Diagnosis not present

## 2019-12-27 DIAGNOSIS — R7301 Impaired fasting glucose: Secondary | ICD-10-CM | POA: Diagnosis not present

## 2019-12-27 DIAGNOSIS — I10 Essential (primary) hypertension: Secondary | ICD-10-CM | POA: Diagnosis not present

## 2019-12-27 DIAGNOSIS — Z1331 Encounter for screening for depression: Secondary | ICD-10-CM | POA: Diagnosis not present

## 2019-12-27 DIAGNOSIS — R600 Localized edema: Secondary | ICD-10-CM | POA: Diagnosis not present

## 2020-01-04 DIAGNOSIS — G4733 Obstructive sleep apnea (adult) (pediatric): Secondary | ICD-10-CM | POA: Diagnosis not present

## 2020-01-14 DIAGNOSIS — J3089 Other allergic rhinitis: Secondary | ICD-10-CM | POA: Diagnosis not present

## 2020-01-14 DIAGNOSIS — J301 Allergic rhinitis due to pollen: Secondary | ICD-10-CM | POA: Diagnosis not present

## 2020-02-02 DIAGNOSIS — J3089 Other allergic rhinitis: Secondary | ICD-10-CM | POA: Diagnosis not present

## 2020-02-02 DIAGNOSIS — J301 Allergic rhinitis due to pollen: Secondary | ICD-10-CM | POA: Diagnosis not present

## 2020-02-22 DIAGNOSIS — J3089 Other allergic rhinitis: Secondary | ICD-10-CM | POA: Diagnosis not present

## 2020-02-22 DIAGNOSIS — J301 Allergic rhinitis due to pollen: Secondary | ICD-10-CM | POA: Diagnosis not present

## 2020-03-13 DIAGNOSIS — J301 Allergic rhinitis due to pollen: Secondary | ICD-10-CM | POA: Diagnosis not present

## 2020-03-13 DIAGNOSIS — J3089 Other allergic rhinitis: Secondary | ICD-10-CM | POA: Diagnosis not present

## 2020-03-20 DIAGNOSIS — J301 Allergic rhinitis due to pollen: Secondary | ICD-10-CM | POA: Diagnosis not present

## 2020-03-20 DIAGNOSIS — J3089 Other allergic rhinitis: Secondary | ICD-10-CM | POA: Diagnosis not present

## 2020-04-06 DIAGNOSIS — J301 Allergic rhinitis due to pollen: Secondary | ICD-10-CM | POA: Diagnosis not present

## 2020-04-06 DIAGNOSIS — J3089 Other allergic rhinitis: Secondary | ICD-10-CM | POA: Diagnosis not present

## 2020-04-13 DIAGNOSIS — J301 Allergic rhinitis due to pollen: Secondary | ICD-10-CM | POA: Diagnosis not present

## 2020-04-13 DIAGNOSIS — J3089 Other allergic rhinitis: Secondary | ICD-10-CM | POA: Diagnosis not present

## 2020-04-20 DIAGNOSIS — J301 Allergic rhinitis due to pollen: Secondary | ICD-10-CM | POA: Diagnosis not present

## 2020-04-20 DIAGNOSIS — J3089 Other allergic rhinitis: Secondary | ICD-10-CM | POA: Diagnosis not present

## 2020-04-21 DIAGNOSIS — G4733 Obstructive sleep apnea (adult) (pediatric): Secondary | ICD-10-CM | POA: Diagnosis not present

## 2020-04-26 DIAGNOSIS — J301 Allergic rhinitis due to pollen: Secondary | ICD-10-CM | POA: Diagnosis not present

## 2020-04-26 DIAGNOSIS — J3089 Other allergic rhinitis: Secondary | ICD-10-CM | POA: Diagnosis not present

## 2020-05-03 DIAGNOSIS — J3089 Other allergic rhinitis: Secondary | ICD-10-CM | POA: Diagnosis not present

## 2020-05-03 DIAGNOSIS — J301 Allergic rhinitis due to pollen: Secondary | ICD-10-CM | POA: Diagnosis not present

## 2020-05-05 DIAGNOSIS — J301 Allergic rhinitis due to pollen: Secondary | ICD-10-CM | POA: Diagnosis not present

## 2020-05-05 DIAGNOSIS — J3089 Other allergic rhinitis: Secondary | ICD-10-CM | POA: Diagnosis not present

## 2020-05-13 DIAGNOSIS — Z23 Encounter for immunization: Secondary | ICD-10-CM | POA: Diagnosis not present

## 2020-05-20 ENCOUNTER — Other Ambulatory Visit: Payer: Self-pay

## 2020-05-20 ENCOUNTER — Ambulatory Visit (INDEPENDENT_AMBULATORY_CARE_PROVIDER_SITE_OTHER): Payer: BC Managed Care – PPO

## 2020-05-20 ENCOUNTER — Encounter (HOSPITAL_COMMUNITY): Payer: Self-pay

## 2020-05-20 ENCOUNTER — Ambulatory Visit (HOSPITAL_COMMUNITY)
Admission: EM | Admit: 2020-05-20 | Discharge: 2020-05-20 | Disposition: A | Discharge status: Home / Self Care | Payer: BC Managed Care – PPO | Attending: Family Medicine | Admitting: Family Medicine

## 2020-05-20 DIAGNOSIS — R109 Unspecified abdominal pain: Secondary | ICD-10-CM | POA: Diagnosis not present

## 2020-05-20 DIAGNOSIS — R739 Hyperglycemia, unspecified: Secondary | ICD-10-CM

## 2020-05-20 DIAGNOSIS — R112 Nausea with vomiting, unspecified: Secondary | ICD-10-CM

## 2020-05-20 DIAGNOSIS — R1084 Generalized abdominal pain: Secondary | ICD-10-CM | POA: Diagnosis not present

## 2020-05-20 LAB — POCT URINALYSIS DIPSTICK, ED / UC
Bilirubin Urine: NEGATIVE
Glucose, UA: 100 mg/dL — AB
Ketones, ur: NEGATIVE mg/dL
Leukocytes,Ua: NEGATIVE
Nitrite: NEGATIVE
Protein, ur: NEGATIVE mg/dL
Specific Gravity, Urine: 1.025 (ref 1.005–1.030)
Urobilinogen, UA: 0.2 mg/dL (ref 0.0–1.0)
pH: 7.5 (ref 5.0–8.0)

## 2020-05-20 LAB — CBG MONITORING, ED: Glucose-Capillary: 199 mg/dL — ABNORMAL HIGH (ref 70–99)

## 2020-05-20 MED ORDER — ONDANSETRON 4 MG PO TBDP
ORAL_TABLET | ORAL | Status: AC
  Filled 2020-05-20: qty 2

## 2020-05-20 MED ORDER — ALUM & MAG HYDROXIDE-SIMETH 200-200-20 MG/5ML PO SUSP
ORAL | Status: AC
  Filled 2020-05-20: qty 30

## 2020-05-20 MED ORDER — ALUM & MAG HYDROXIDE-SIMETH 200-200-20 MG/5ML PO SUSP: 30.0000 mL | Freq: Once | ORAL | Status: DC

## 2020-05-20 MED ORDER — ALUM & MAG HYDROXIDE-SIMETH 200-200-20 MG/5ML PO SUSP
30.0000 mL | Freq: Once | ORAL | Status: AC
  Administered 2020-05-20: 30 mL via ORAL

## 2020-05-20 MED ORDER — ONDANSETRON 4 MG PO TBDP
8.0000 mg | ORAL_TABLET | Freq: Once | ORAL | Status: AC
  Administered 2020-05-20: 8 mg via ORAL

## 2020-05-20 MED ORDER — DICYCLOMINE HCL 10 MG/5ML PO SOLN: 10.0000 mg | Freq: Once | ORAL | Status: DC

## 2020-05-20 MED ORDER — ONDANSETRON 8 MG PO TBDP: 8.0000 mg | ORAL_TABLET | Freq: Three times a day (TID) | ORAL | 0 refills | Status: DC | PRN

## 2020-05-20 MED ORDER — DICYCLOMINE HCL 20 MG PO TABS: 20.0000 mg | ORAL_TABLET | Freq: Two times a day (BID) | ORAL | 0 refills | Status: DC

## 2020-05-20 MED ORDER — LIDOCAINE VISCOUS HCL 2 % MT SOLN
15.0000 mL | Freq: Once | OROMUCOSAL | Status: AC
  Administered 2020-05-20: 15 mL via ORAL

## 2020-05-20 MED ORDER — FAMOTIDINE 40 MG PO TABS: 40.0000 mg | ORAL_TABLET | Freq: Every day | ORAL | 0 refills | Status: DC

## 2020-05-20 MED ORDER — LIDOCAINE VISCOUS HCL 2 % MT SOLN
OROMUCOSAL | Status: AC
  Filled 2020-05-20: qty 15

## 2020-05-20 NOTE — ED Notes (Signed)
cbg 199 reported to [employer], np

## 2020-05-20 NOTE — ED Triage Notes (Signed)
Pt presents today with abdominal pain and left flank pain that started last night. Has had so some nausea and vomiting but no diarrhea. Not urinating a lot when he goes. Feels like there is a tingling sensation when urinating

## 2020-05-20 NOTE — ED Provider Notes (Signed)
[place]    CSN: [id number] Arrival date & time: 05/20/20  1024      History   Chief Complaint Chief Complaint  Patient presents with  . Abdominal Pain    HPI Casey Roach is a 59 y.o. male.   HPI  Patient with a medical history significant for obesity, sleep apnea, presents for evaluation of generalized abdominal pain radiating into the left flank times this morning.  Patient reports some intermittent nausea and loose stool.  He reports eating food yesterday which is consistent with his normal diet that he self prepared.  He reports awakening around 2 AM with abdominal pain which was sharp and cramping bilateral sides of the abdomen.  He grew concerned of a possible renal stone as he started developing left flank pain.  He denies any dysuria or history of renal stones.  He has not seen any blood in his urine.  He has not taken anything for his symptoms.  He has his gallbladder and his appendix.  No history of known diverticular disease.  Past Medical History:  Diagnosis Date  . Acute bronchitis   . Allergy   . Morbid obesity ([place]) 06/02/2008   Qualifier: Diagnosis of  By: Julien Girt CMA, Leigh    . Obesity, unspecified   . Obstructive sleep apnea (adult) (pediatric)   . Tubular adenoma of colon 11/2008  . Unspecified essential hypertension     Patient Active Problem List   Diagnosis Date Noted  . Osteoarthritis 07/10/2018  . Hepatic steatosis 09/15/2017  . Pain in joint of right hip 08/25/2017  . Varicose veins of left lower extremity with complications 95/18/8416  . Varicose veins of bilateral lower extremities with other complications 60/63/0160  . Insomnia 12/03/2015  . History of colonic polyps 05/31/2014  . Benign neoplasm of colon 05/31/2014  . Varicose veins without complication 10/93/2355  . Benign nodular prostatic hyperplasia with lower urinary tract symptoms 05/04/2014  . ED (erectile dysfunction) of organic origin 05/04/2014  . Hypogonadism  in male 05/04/2014  . Varicose veins of lower extremities with other complications 73/22/0254  . Varicose veins of lower extremities with ulcer and inflammation ([place]) 02/08/2014  . Morbid obesity ([place]) 07/21/2013  . HTN (hypertension) 07/21/2013  . Dyspnea 07/21/2013  . ACUTE BRONCHITIS 06/29/2010  . Obstructive sleep apnea 06/02/2008  . HYPERTENSION 06/02/2008    Past Surgical History:  Procedure Laterality Date  . COLONOSCOPY WITH PROPOFOL N/A 05/31/2014   Procedure: COLONOSCOPY WITH PROPOFOL;  Surgeon: Inda Castle, MD;  Location: [facility];  Service: Endoscopy;  Laterality: N/A;  . LASER ABLATION  03/28/2014, 05/16/2014   left leg, right leg  . VARICOCELE EXCISION         Home Medications    Prior to Admission medications   Medication Sig Start Date End Date Taking? Authorizing Provider  lisinopril-hydrochlorothiazide (PRINZIDE,ZESTORETIC) 20-12.5 MG per tablet Take 1 tablet by mouth every morning.    Yes [provider]  Naproxen Sodium (ALEVE PO) Take 2 capsules by mouth once as needed (pain).    Yes [provider]  pantoprazole (PROTONIX) 40 MG tablet Take 1 tablet (40 mg total) by mouth daily. Please schedule an appt for any further refills. 08/09/19  Yes Levin Erp, PA  fluticasone (FLONASE) 50 MCG/ACT nasal spray Place 2 sprays into the nose daily as needed for allergies or rhinitis.     [provider]  Fluticasone Furoate-Vilanterol (BREO ELLIPTA) 100-25 MCG/INH AEPB Inhale 1 puff into the lungs daily.  Rinse mouth 12/01/14   Baird Lyons D, MD  Ibuprofen (ADVIL) 200 MG CAPS Take 400 mg by mouth once as needed (pain.).    [provider]  NON FORMULARY Allergy Vaccine -Meg Whelan/twice a month per pt    [provider]  NON FORMULARY CPAP 11    [provider]  terbinafine (LAMISIL) 250 MG tablet Take 1 tablet (250 mg total) by mouth daily. 07/27/18   Wallene Huh, DPM  testosterone cypionate  (DEPOTESTOSTERONE CYPIONATE) 200 MG/ML injection Inject 200 mg into the muscle every 21 ( twenty-one) days.    [provider]  VENTOLIN HFA 108 (90 Base) MCG/ACT inhaler USE 2 PUFFS EVERY 4 TO 6 HOURS AS NEEDED FOR COUGH/WHEEZING. 05/28/18   Deneise Lever, MD    Family History Family History  Problem Relation Age of Onset  . Diabetes Mother   . Pancreatic cancer Maternal Grandmother     Social History Social History   Tobacco Use  . Smoking status: Never Smoker  . Smokeless tobacco: Never Used  Vaping Use  . Vaping Use: Never used  Substance Use Topics  . Alcohol use: Yes    Comment: rarely  . Drug use: No     Allergies   Patient has no known allergies.  Review of Systems Review of Systems Pertinent negatives listed in HPI Physical Exam Triage Vital Signs ED Triage Vitals  Enc Vitals Group     BP 05/20/20 1120 (!) 176/95     Pulse Rate 05/20/20 1120 87     Resp 05/20/20 1120 18     Temp 05/20/20 1120 98.2 F (36.8 C)     Temp src --      SpO2 05/20/20 1120 100 %     Weight --      Height --      Head Circumference --      Peak Flow --      Pain Score 05/20/20 1121 7     Pain Loc --      Pain Edu? --      Excl. in [place]? --    No data found.  Updated Vital Signs BP (!) 176/95 (BP Location: Right Arm)   Pulse 87   Temp 98.2 F (36.8 C)   Resp 18   SpO2 100%   Visual Acuity Right Eye Distance:   Left Eye Distance:   Bilateral Distance:    Right Eye Near:   Left Eye Near:    Bilateral Near:     Physical Exam Constitutional:      General: He is not in acute distress.    Appearance: He is obese. He is not toxic-appearing or diaphoretic.  HENT:     Head: Normocephalic and atraumatic.  Cardiovascular:     Rate and Rhythm: Normal rate and regular rhythm.     Heart sounds: Normal heart sounds.  Abdominal:     General: Abdomen is protuberant. Bowel sounds are normal.     Tenderness: There is no abdominal tenderness. There is no right  CVA tenderness or left CVA tenderness.     Comments: No reproducible abdominal tenderness   Musculoskeletal:     Right lower leg: No edema.     Left lower leg: No edema.  Skin:    General: Skin is warm.     Capillary Refill: Capillary refill takes less than 2 seconds.  Neurological:     Mental Status: He is alert.  Psychiatric:  Attention and Perception: Attention and perception normal.        Mood and Affect: Mood normal.      UC Treatments / Results  Labs (all labs ordered are listed, but only abnormal results are displayed) Labs Reviewed  POCT URINALYSIS DIPSTICK, ED / UC - Abnormal; Notable for the following components:      Result Value   Glucose, UA 100 (*)    Hgb urine dipstick TRACE (*)    All other components within normal limits  CBG MONITORING, ED - Abnormal; Notable for the following components:   Glucose-Capillary 199 (*)    All other components within normal limits    EKG   Radiology DG Abdomen 1 View  Result Date: 05/20/2020 CLINICAL DATA:  Left flank pain. EXAM: ABDOMEN - 1 VIEW COMPARISON:  None. FINDINGS: Evaluation is limited due to poor penetration. No obvious renal stones identified. No ureteral stones are noted. No other acute abnormalities. IMPRESSION: The study is limited due to poor penetration. No renal or ureteral stones are identified. Electronically Signed   By: Dorise Bullion III M.D   On: 05/20/2020 12:55    Procedures Procedures (including critical care time)  Medications Ordered in UC Medications - No data to display  Initial Impression / Assessment and Plan / UC Course  I have reviewed the triage vital signs and the nursing notes.  Pertinent labs & imaging results that were available during my care of the patient were reviewed by me and considered in my medical decision making (see chart for details).    UA significant for glucose present.  Fingerstick indicated a fasting glucose of 199.  In review of EMR patient had been  diagnosed as prediabetic in 2019 with an A1c of 5.9.  Abdominal 1 view was negative for any calcifications.  Patient treated with a GI cocktail symptoms improved.  Will discharge home with dicyclomine, famotidine, and Zofran as needed.  Recommend following up with primary care provider as patient needs further work-up and evaluation of possible type 2 diabetes.  Patient verbalized understanding and agreement with plan.  Strict ER precautions if abdominal pain worsens or become severe prior to follow-up with primary care provider.  Final Clinical Impressions(s) / UC Diagnoses   Final diagnoses:  Generalized abdominal pain  Intractable vomiting with nausea, unspecified vomiting type  Left flank pain  Hyperglycemia     Discharge Instructions     Your blood sugar today is elevated in the range in which I suspect you may have some underlying type 2 diabetes.  I recommend follow-up immediately with your primary care provider as you need additional blood work to evaluate the overall status of your glycemic control.  Your blood sugar today is 199 which places you in the range of type 2 diabetes especially since you are fasting.  For the abdominal pain your urine only showed the increase glucose which is her blood sugar.  And a trace of blood which could be just contamination from collection.  I am treating you for a GI related illness today.  With dicyclomine abdominal pain, famotidine 40 mg once daily to reduce acidity of stomach which an cause nausea. Zofran for nausea.  If any of your symptoms worsen or do not improve go immediately to the emergency department for additional studies.    ED Prescriptions    Medication Sig Dispense Auth. Provider   dicyclomine (BENTYL) 20 MG tablet Take 1 tablet (20 mg total) by mouth 2 (two) times daily. 20 tablet  Scot Jun, FNP   ondansetron (ZOFRAN-ODT) 8 MG disintegrating tablet Take 1 tablet (8 mg total) by mouth every 8 (eight) hours as needed for  nausea. 30 tablet Scot Jun, FNP   famotidine (PEPCID) 40 MG tablet Take 1 tablet (40 mg total) by mouth at bedtime. 20 tablet Scot Jun, FNP     PDMP not reviewed this encounter.   Scot Jun, [place] 05/23/20 [phone]

## 2020-05-20 NOTE — Discharge Instructions (Addendum)
Your blood sugar today is elevated in the range in which I suspect you may have some underlying type 2 diabetes.  I recommend follow-up immediately with your primary care provider as you need additional blood work to evaluate the overall status of your glycemic control.  Your blood sugar today is 199 which places you in the range of type 2 diabetes especially since you are fasting.  For the abdominal pain your urine only showed the increase glucose which is her blood sugar.  And a trace of blood which could be just contamination from collection.  I am treating you for a GI related illness today.  With dicyclomine abdominal pain, famotidine 40 mg once daily to reduce acidity of stomach which an cause nausea. Zofran for nausea.  If any of your symptoms worsen or do not improve go immediately to the emergency department for additional studies.

## 2020-05-23 DIAGNOSIS — J301 Allergic rhinitis due to pollen: Secondary | ICD-10-CM | POA: Diagnosis not present

## 2020-05-23 DIAGNOSIS — J3089 Other allergic rhinitis: Secondary | ICD-10-CM | POA: Diagnosis not present

## 2020-06-12 DIAGNOSIS — J301 Allergic rhinitis due to pollen: Secondary | ICD-10-CM | POA: Diagnosis not present

## 2020-06-12 DIAGNOSIS — J3089 Other allergic rhinitis: Secondary | ICD-10-CM | POA: Diagnosis not present

## 2020-06-18 ENCOUNTER — Ambulatory Visit: Payer: BC Managed Care – PPO | Attending: Internal Medicine

## 2020-06-18 DIAGNOSIS — Z23 Encounter for immunization: Secondary | ICD-10-CM

## 2020-06-18 NOTE — Progress Notes (Signed)
   Covid-19 Vaccination Clinic  Name:  SOLAN VOSLER    MRN: [id number] DOB: October 08, 1960  06/18/2020  Mr. Wisnewski was observed post Covid-19 immunization for 15 minutes without incident. He was provided with Vaccine Information Sheet and instruction to access the V-Safe system.   Mr. Tabet was instructed to call 911 with any severe reactions post vaccine: Marland Kitchen Difficulty breathing  . Swelling of face and throat  . A fast heartbeat  . A bad rash all over body  . Dizziness and weakness   Immunizations Administered    Name Date Dose VIS Date Route   Pfizer COVID-19 Vaccine 06/18/2020 10:09 AM 0.3 mL 05/24/2020 Intramuscular   Manufacturer: [place]   Lot: [id number]   [place]: [id number]

## 2020-06-26 DIAGNOSIS — L570 Actinic keratosis: Secondary | ICD-10-CM | POA: Diagnosis not present

## 2020-06-26 DIAGNOSIS — D225 Melanocytic nevi of trunk: Secondary | ICD-10-CM | POA: Diagnosis not present

## 2020-06-26 DIAGNOSIS — L2089 Other atopic dermatitis: Secondary | ICD-10-CM | POA: Diagnosis not present

## 2020-06-26 DIAGNOSIS — D485 Neoplasm of uncertain behavior of skin: Secondary | ICD-10-CM | POA: Diagnosis not present

## 2020-06-26 DIAGNOSIS — Z85828 Personal history of other malignant neoplasm of skin: Secondary | ICD-10-CM | POA: Diagnosis not present

## 2020-07-04 DIAGNOSIS — J301 Allergic rhinitis due to pollen: Secondary | ICD-10-CM | POA: Diagnosis not present

## 2020-07-04 DIAGNOSIS — J3089 Other allergic rhinitis: Secondary | ICD-10-CM | POA: Diagnosis not present

## 2020-07-10 DIAGNOSIS — J45991 Cough variant asthma: Secondary | ICD-10-CM | POA: Diagnosis not present

## 2020-07-10 DIAGNOSIS — R21 Rash and other nonspecific skin eruption: Secondary | ICD-10-CM | POA: Diagnosis not present

## 2020-07-10 DIAGNOSIS — J301 Allergic rhinitis due to pollen: Secondary | ICD-10-CM | POA: Diagnosis not present

## 2020-07-10 DIAGNOSIS — J3089 Other allergic rhinitis: Secondary | ICD-10-CM | POA: Diagnosis not present

## 2020-07-11 DIAGNOSIS — M19012 Primary osteoarthritis, left shoulder: Secondary | ICD-10-CM | POA: Diagnosis not present

## 2020-07-11 DIAGNOSIS — E291 Testicular hypofunction: Secondary | ICD-10-CM | POA: Diagnosis not present

## 2020-07-11 DIAGNOSIS — I10 Essential (primary) hypertension: Secondary | ICD-10-CM | POA: Diagnosis not present

## 2020-07-11 DIAGNOSIS — Z1212 Encounter for screening for malignant neoplasm of rectum: Secondary | ICD-10-CM | POA: Diagnosis not present

## 2020-07-12 DIAGNOSIS — Z125 Encounter for screening for malignant neoplasm of prostate: Secondary | ICD-10-CM | POA: Diagnosis not present

## 2020-07-12 DIAGNOSIS — E291 Testicular hypofunction: Secondary | ICD-10-CM | POA: Diagnosis not present

## 2020-07-12 DIAGNOSIS — R7301 Impaired fasting glucose: Secondary | ICD-10-CM | POA: Diagnosis not present

## 2020-07-12 DIAGNOSIS — Z0000 Encounter for general adult medical examination without abnormal findings: Secondary | ICD-10-CM | POA: Diagnosis not present

## 2020-07-12 DIAGNOSIS — R7989 Other specified abnormal findings of blood chemistry: Secondary | ICD-10-CM | POA: Diagnosis not present

## 2020-07-19 ENCOUNTER — Other Ambulatory Visit (HOSPITAL_BASED_OUTPATIENT_CLINIC_OR_DEPARTMENT_OTHER): Payer: Self-pay | Admitting: Internal Medicine

## 2020-07-19 DIAGNOSIS — I10 Essential (primary) hypertension: Secondary | ICD-10-CM | POA: Diagnosis not present

## 2020-07-19 DIAGNOSIS — R1011 Right upper quadrant pain: Secondary | ICD-10-CM

## 2020-07-19 DIAGNOSIS — Z1212 Encounter for screening for malignant neoplasm of rectum: Secondary | ICD-10-CM | POA: Diagnosis not present

## 2020-07-19 DIAGNOSIS — R82998 Other abnormal findings in urine: Secondary | ICD-10-CM | POA: Diagnosis not present

## 2020-07-19 DIAGNOSIS — Z0000 Encounter for general adult medical examination without abnormal findings: Secondary | ICD-10-CM | POA: Diagnosis not present

## 2020-07-21 ENCOUNTER — Ambulatory Visit (HOSPITAL_BASED_OUTPATIENT_CLINIC_OR_DEPARTMENT_OTHER)
Admission: RE | Admit: 2020-07-21 | Discharge: 2020-07-21 | Disposition: A | Discharge status: Home / Self Care | Payer: BC Managed Care – PPO | Source: Ambulatory Visit | Attending: Internal Medicine | Admitting: Internal Medicine

## 2020-07-21 ENCOUNTER — Other Ambulatory Visit: Payer: Self-pay

## 2020-07-21 DIAGNOSIS — R1011 Right upper quadrant pain: Secondary | ICD-10-CM | POA: Diagnosis not present

## 2020-07-21 DIAGNOSIS — R109 Unspecified abdominal pain: Secondary | ICD-10-CM | POA: Diagnosis not present

## 2020-07-25 DIAGNOSIS — J301 Allergic rhinitis due to pollen: Secondary | ICD-10-CM | POA: Diagnosis not present

## 2020-07-25 DIAGNOSIS — J3089 Other allergic rhinitis: Secondary | ICD-10-CM | POA: Diagnosis not present

## 2020-08-09 DIAGNOSIS — I10 Essential (primary) hypertension: Secondary | ICD-10-CM | POA: Diagnosis not present

## 2020-08-09 DIAGNOSIS — R1011 Right upper quadrant pain: Secondary | ICD-10-CM | POA: Diagnosis not present

## 2020-08-16 DIAGNOSIS — J301 Allergic rhinitis due to pollen: Secondary | ICD-10-CM | POA: Diagnosis not present

## 2020-08-16 DIAGNOSIS — J3089 Other allergic rhinitis: Secondary | ICD-10-CM | POA: Diagnosis not present

## 2020-09-05 DIAGNOSIS — J3089 Other allergic rhinitis: Secondary | ICD-10-CM | POA: Diagnosis not present

## 2020-09-05 DIAGNOSIS — J301 Allergic rhinitis due to pollen: Secondary | ICD-10-CM | POA: Diagnosis not present

## 2020-09-06 ENCOUNTER — Other Ambulatory Visit: Payer: Self-pay | Admitting: Internal Medicine

## 2020-09-06 DIAGNOSIS — R1011 Right upper quadrant pain: Secondary | ICD-10-CM

## 2020-10-02 DIAGNOSIS — J301 Allergic rhinitis due to pollen: Secondary | ICD-10-CM | POA: Diagnosis not present

## 2020-10-02 DIAGNOSIS — J3089 Other allergic rhinitis: Secondary | ICD-10-CM | POA: Diagnosis not present

## 2020-10-03 ENCOUNTER — Other Ambulatory Visit: Payer: BC Managed Care – PPO

## 2020-10-23 DIAGNOSIS — J301 Allergic rhinitis due to pollen: Secondary | ICD-10-CM | POA: Diagnosis not present

## 2020-10-23 DIAGNOSIS — J3089 Other allergic rhinitis: Secondary | ICD-10-CM | POA: Diagnosis not present

## 2020-10-24 ENCOUNTER — Ambulatory Visit
Admission: RE | Admit: 2020-10-24 | Discharge: 2020-10-24 | Disposition: A | Discharge status: Home / Self Care | Payer: BC Managed Care – PPO | Source: Ambulatory Visit | Attending: Internal Medicine | Admitting: Internal Medicine

## 2020-10-24 ENCOUNTER — Other Ambulatory Visit: Payer: Self-pay

## 2020-10-24 DIAGNOSIS — R110 Nausea: Secondary | ICD-10-CM | POA: Diagnosis not present

## 2020-10-24 DIAGNOSIS — R1011 Right upper quadrant pain: Secondary | ICD-10-CM

## 2020-10-24 DIAGNOSIS — M47819 Spondylosis without myelopathy or radiculopathy, site unspecified: Secondary | ICD-10-CM | POA: Diagnosis not present

## 2020-10-24 DIAGNOSIS — N200 Calculus of kidney: Secondary | ICD-10-CM | POA: Diagnosis not present

## 2020-10-24 DIAGNOSIS — N281 Cyst of kidney, acquired: Secondary | ICD-10-CM | POA: Diagnosis not present

## 2020-10-24 MED ORDER — IOPAMIDOL (ISOVUE-300) INJECTION 61%
125.0000 mL | Freq: Once | INTRAVENOUS | Status: AC | PRN
  Administered 2020-10-24: 125 mL via INTRAVENOUS

## 2020-10-26 DIAGNOSIS — G4733 Obstructive sleep apnea (adult) (pediatric): Secondary | ICD-10-CM | POA: Diagnosis not present

## 2020-11-13 DIAGNOSIS — J301 Allergic rhinitis due to pollen: Secondary | ICD-10-CM | POA: Diagnosis not present

## 2020-11-13 DIAGNOSIS — J3089 Other allergic rhinitis: Secondary | ICD-10-CM | POA: Diagnosis not present

## 2020-11-23 IMAGING — DX DG ABDOMEN 1V
1 series · 1 of 1 positions shown · non-contrast
Comparison: None.

CLINICAL DATA: Left flank pain.

EXAM:
ABDOMEN - 1 VIEW

[abdomen kub]
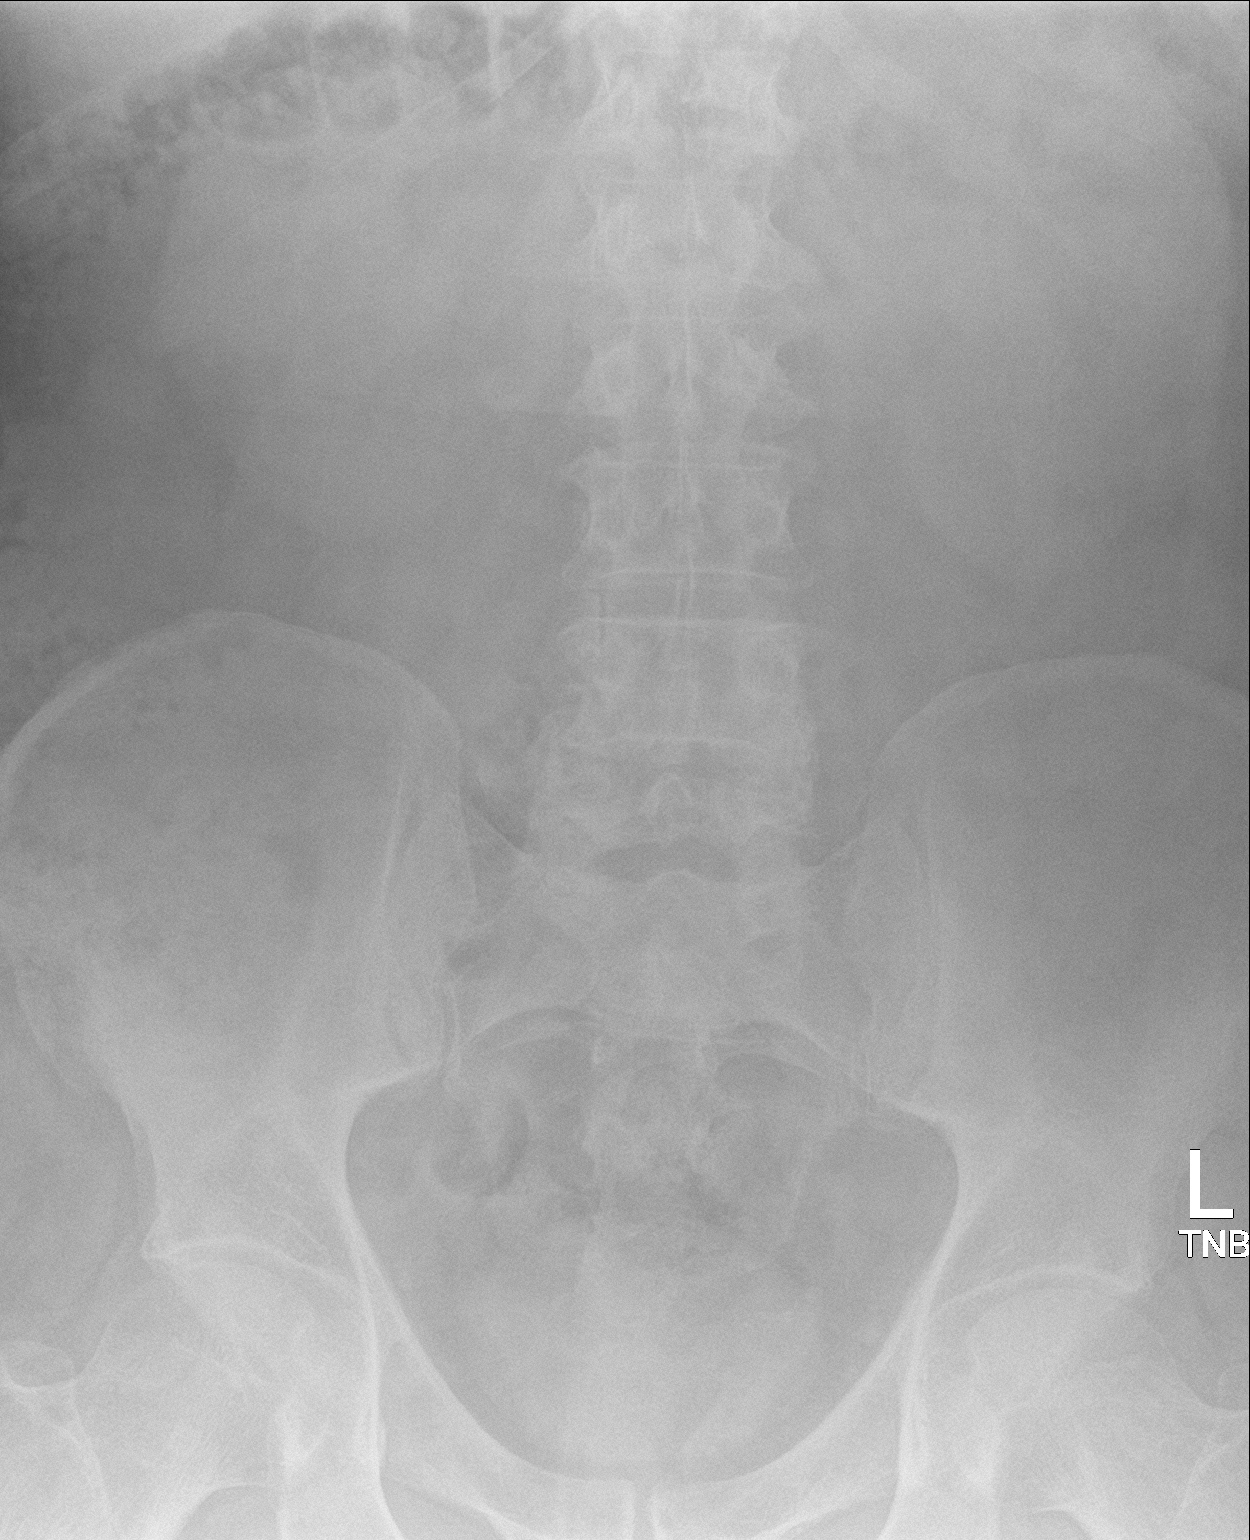

[1 of 1 positions shown; findings below may reference images not displayed]

FINDINGS: Evaluation is limited due to poor penetration. No obvious renal
stones identified. No ureteral stones are noted. No other acute
abnormalities.
IMPRESSION: The study is limited due to poor penetration. No renal or ureteral
stones are identified.

## 2020-12-04 DIAGNOSIS — J3089 Other allergic rhinitis: Secondary | ICD-10-CM | POA: Diagnosis not present

## 2020-12-04 DIAGNOSIS — J301 Allergic rhinitis due to pollen: Secondary | ICD-10-CM | POA: Diagnosis not present

## 2020-12-18 DIAGNOSIS — E291 Testicular hypofunction: Secondary | ICD-10-CM | POA: Diagnosis not present

## 2020-12-18 DIAGNOSIS — N401 Enlarged prostate with lower urinary tract symptoms: Secondary | ICD-10-CM | POA: Diagnosis not present

## 2020-12-25 DIAGNOSIS — J3089 Other allergic rhinitis: Secondary | ICD-10-CM | POA: Diagnosis not present

## 2020-12-25 DIAGNOSIS — J301 Allergic rhinitis due to pollen: Secondary | ICD-10-CM | POA: Diagnosis not present

## 2021-01-16 DIAGNOSIS — J301 Allergic rhinitis due to pollen: Secondary | ICD-10-CM | POA: Diagnosis not present

## 2021-01-16 DIAGNOSIS — J3089 Other allergic rhinitis: Secondary | ICD-10-CM | POA: Diagnosis not present

## 2021-01-16 DIAGNOSIS — M7989 Other specified soft tissue disorders: Secondary | ICD-10-CM

## 2021-01-22 DIAGNOSIS — I10 Essential (primary) hypertension: Secondary | ICD-10-CM | POA: Diagnosis not present

## 2021-01-22 DIAGNOSIS — R7301 Impaired fasting glucose: Secondary | ICD-10-CM | POA: Diagnosis not present

## 2021-02-06 DIAGNOSIS — J301 Allergic rhinitis due to pollen: Secondary | ICD-10-CM | POA: Diagnosis not present

## 2021-02-06 DIAGNOSIS — J3089 Other allergic rhinitis: Secondary | ICD-10-CM | POA: Diagnosis not present

## 2021-02-19 DIAGNOSIS — J3089 Other allergic rhinitis: Secondary | ICD-10-CM | POA: Diagnosis not present

## 2021-02-19 DIAGNOSIS — J301 Allergic rhinitis due to pollen: Secondary | ICD-10-CM | POA: Diagnosis not present

## 2021-02-26 DIAGNOSIS — J3089 Other allergic rhinitis: Secondary | ICD-10-CM | POA: Diagnosis not present

## 2021-02-26 DIAGNOSIS — J301 Allergic rhinitis due to pollen: Secondary | ICD-10-CM | POA: Diagnosis not present

## 2021-03-19 DIAGNOSIS — J301 Allergic rhinitis due to pollen: Secondary | ICD-10-CM | POA: Diagnosis not present

## 2021-03-19 DIAGNOSIS — J3089 Other allergic rhinitis: Secondary | ICD-10-CM | POA: Diagnosis not present

## 2021-04-04 DIAGNOSIS — J301 Allergic rhinitis due to pollen: Secondary | ICD-10-CM | POA: Diagnosis not present

## 2021-04-04 DIAGNOSIS — J3089 Other allergic rhinitis: Secondary | ICD-10-CM | POA: Diagnosis not present

## 2021-04-25 DIAGNOSIS — J301 Allergic rhinitis due to pollen: Secondary | ICD-10-CM | POA: Diagnosis not present

## 2021-04-25 DIAGNOSIS — J3089 Other allergic rhinitis: Secondary | ICD-10-CM | POA: Diagnosis not present

## 2021-04-27 DIAGNOSIS — J3089 Other allergic rhinitis: Secondary | ICD-10-CM | POA: Diagnosis not present

## 2021-04-27 DIAGNOSIS — J301 Allergic rhinitis due to pollen: Secondary | ICD-10-CM | POA: Diagnosis not present

## 2021-04-30 DIAGNOSIS — J301 Allergic rhinitis due to pollen: Secondary | ICD-10-CM | POA: Diagnosis not present

## 2021-04-30 DIAGNOSIS — J3089 Other allergic rhinitis: Secondary | ICD-10-CM | POA: Diagnosis not present

## 2021-05-07 DIAGNOSIS — J3089 Other allergic rhinitis: Secondary | ICD-10-CM | POA: Diagnosis not present

## 2021-05-07 DIAGNOSIS — J301 Allergic rhinitis due to pollen: Secondary | ICD-10-CM | POA: Diagnosis not present

## 2021-05-10 DIAGNOSIS — J3089 Other allergic rhinitis: Secondary | ICD-10-CM | POA: Diagnosis not present

## 2021-05-10 DIAGNOSIS — J301 Allergic rhinitis due to pollen: Secondary | ICD-10-CM | POA: Diagnosis not present

## 2021-05-26 DIAGNOSIS — Z23 Encounter for immunization: Secondary | ICD-10-CM | POA: Diagnosis not present

## 2021-05-31 DIAGNOSIS — J301 Allergic rhinitis due to pollen: Secondary | ICD-10-CM | POA: Diagnosis not present

## 2021-05-31 DIAGNOSIS — J3089 Other allergic rhinitis: Secondary | ICD-10-CM | POA: Diagnosis not present

## 2021-06-18 DIAGNOSIS — J3089 Other allergic rhinitis: Secondary | ICD-10-CM | POA: Diagnosis not present

## 2021-06-18 DIAGNOSIS — J301 Allergic rhinitis due to pollen: Secondary | ICD-10-CM | POA: Diagnosis not present

## 2021-06-26 DIAGNOSIS — L570 Actinic keratosis: Secondary | ICD-10-CM | POA: Diagnosis not present

## 2021-06-26 DIAGNOSIS — L821 Other seborrheic keratosis: Secondary | ICD-10-CM | POA: Diagnosis not present

## 2021-06-26 DIAGNOSIS — C44622 Squamous cell carcinoma of skin of right upper limb, including shoulder: Secondary | ICD-10-CM | POA: Diagnosis not present

## 2021-06-26 DIAGNOSIS — D1801 Hemangioma of skin and subcutaneous tissue: Secondary | ICD-10-CM | POA: Diagnosis not present

## 2021-06-26 DIAGNOSIS — Z85828 Personal history of other malignant neoplasm of skin: Secondary | ICD-10-CM | POA: Diagnosis not present

## 2021-06-26 DIAGNOSIS — L820 Inflamed seborrheic keratosis: Secondary | ICD-10-CM | POA: Diagnosis not present

## 2021-07-05 DIAGNOSIS — J301 Allergic rhinitis due to pollen: Secondary | ICD-10-CM | POA: Diagnosis not present

## 2021-07-05 DIAGNOSIS — J3089 Other allergic rhinitis: Secondary | ICD-10-CM | POA: Diagnosis not present

## 2021-07-12 DIAGNOSIS — J3089 Other allergic rhinitis: Secondary | ICD-10-CM | POA: Diagnosis not present

## 2021-07-12 DIAGNOSIS — J301 Allergic rhinitis due to pollen: Secondary | ICD-10-CM | POA: Diagnosis not present

## 2021-07-12 DIAGNOSIS — J45991 Cough variant asthma: Secondary | ICD-10-CM | POA: Diagnosis not present

## 2021-07-12 DIAGNOSIS — R21 Rash and other nonspecific skin eruption: Secondary | ICD-10-CM | POA: Diagnosis not present

## 2021-07-16 DIAGNOSIS — Z125 Encounter for screening for malignant neoplasm of prostate: Secondary | ICD-10-CM | POA: Diagnosis not present

## 2021-07-16 DIAGNOSIS — I10 Essential (primary) hypertension: Secondary | ICD-10-CM | POA: Diagnosis not present

## 2021-07-16 DIAGNOSIS — E291 Testicular hypofunction: Secondary | ICD-10-CM | POA: Diagnosis not present

## 2021-07-16 DIAGNOSIS — R7301 Impaired fasting glucose: Secondary | ICD-10-CM | POA: Diagnosis not present

## 2021-07-23 DIAGNOSIS — R82998 Other abnormal findings in urine: Secondary | ICD-10-CM | POA: Diagnosis not present

## 2021-07-25 DIAGNOSIS — J301 Allergic rhinitis due to pollen: Secondary | ICD-10-CM | POA: Diagnosis not present

## 2021-07-25 DIAGNOSIS — J3089 Other allergic rhinitis: Secondary | ICD-10-CM | POA: Diagnosis not present

## 2021-10-29 ENCOUNTER — Encounter: Payer: Self-pay | Admitting: Primary Care

## 2021-10-29 ENCOUNTER — Other Ambulatory Visit: Payer: Self-pay

## 2021-10-29 ENCOUNTER — Ambulatory Visit: Payer: 59 | Admitting: Primary Care

## 2021-10-29 VITALS — BP 124/70 | HR 87 | Temp 98.6°F | Ht 75.0 in | Wt 365.0 lb

## 2021-10-29 DIAGNOSIS — G4733 Obstructive sleep apnea (adult) (pediatric): Secondary | ICD-10-CM

## 2021-10-29 NOTE — Patient Instructions (Addendum)
Recommendations: ?Continue to wear CPAP every night for 4-6 hours or longer ?Confirmed your machine is between 70-63 years old (we placed CPAP order in 2015 with [facility]- when changed from [place]) ? ?Orders: ?New CPAP at [place] / [facility]  ? ?Follow-up: ?1 year with Dr. Annamaria Boots ? ?CPAP and BIPAP Information ?CPAP and BIPAP are methods that use air pressure to keep your airways open and to help you breathe well. CPAP and BIPAP use different amounts of pressure. Your health care provider will tell you whether CPAP or BIPAP would be more helpful for you. ?CPAP stands for "continuous positive airway pressure." With CPAP, the amount of pressure stays the same while you breathe in (inhale) and out (exhale). ?BIPAP stands for "bi-level positive airway pressure." With BIPAP, the amount of pressure will be higher when you inhale and lower when you exhale. This allows you to take larger breaths. ?CPAP or BIPAP may be used in the hospital, or your health care provider may want you to use it at home. You may need to have a sleep study before your health care provider can order a machine for you to use at home. ?What are the advantages? ?CPAP or BIPAP can be helpful if you have: ?Sleep apnea. ?Chronic obstructive pulmonary disease (COPD). ?Heart failure. ?Medical conditions that cause muscle weakness, including muscular dystrophy or amyotrophic lateral sclerosis (ALS). ?Other problems that cause breathing to be shallow, weak, abnormal, or difficult. ?CPAP and BIPAP are most commonly used for obstructive sleep apnea (OSA) to keep the airways from collapsing when the muscles relax during sleep. ?What are the risks? ?Generally, this is a safe treatment. However, problems may occur, including: ?Irritated skin or skin sores if the mask does not fit properly. ?Dry or stuffy nose or nosebleeds. ?Dry mouth. ?Feeling gassy or bloated. ?Sinus or lung infection if the equipment is not cleaned properly. ?When should CPAP or BIPAP be  used? ?In most cases, the mask only needs to be worn during sleep. Generally, the mask needs to be worn throughout the night and during any daytime naps. People with certain medical conditions may also need to wear the mask at other times, such as when they are awake. Follow instructions from your health care provider about when to use the machine. ?What happens during CPAP or BIPAP? ?Both CPAP and BIPAP are provided by a small machine with a flexible plastic tube that attaches to a plastic mask that you wear. Air is blown through the mask into your nose or mouth. The amount of pressure that is used to blow the air can be adjusted on the machine. Your health care provider will set the pressure setting and help you find the best mask for you. ?Tips for using the mask ?Because the mask needs to be snug, some people feel trapped or closed-in (claustrophobic) when first using the mask. If you feel this way, you may need to get used to the mask. One way to do this is to hold the mask loosely over your nose or mouth and then gradually apply the mask more snugly. You can also gradually increase the amount of time that you use the mask. ?Masks are available in various types and sizes. If your mask does not fit well, talk with your health care provider about getting a different one. Some common types of masks include: ?Full face masks, which fit over the mouth and nose. ?Nasal masks, which fit over the nose. ?Nasal pillow or prong masks, which fit  into the nostrils. ?If you are using a mask that fits over your nose and you tend to breathe through your mouth, a chin strap may be applied to help keep your mouth closed. ?Use a skin barrier to protect your skin as told by your health care provider. ?Some CPAP and BIPAP machines have alarms that may sound if the mask comes off or develops a leak. ?If you have trouble with the mask, it is very important that you talk with your health care provider about finding a way to make the  mask easier to tolerate. Do not stop using the mask. There could be a negative impact on your health if you stop using the mask. ?Tips for using the machine ?Place your CPAP or BIPAP machine on a secure table or stand near an electrical outlet. ?Know where the on/off switch is on the machine. ?Follow instructions from your health care provider about how to set the pressure on your machine and when you should use it. ?Do not eat or drink while the CPAP or BIPAP machine is on. Food or fluids could get pushed into your lungs by the pressure of the CPAP or BIPAP. ?For home use, CPAP and BIPAP machines can be rented or purchased through home health care companies. Many different brands of machines are available. Renting a machine before purchasing may help you find out which particular machine works well for you. Your health insurance company may also decide which machine you may get. ?Keep the CPAP or BIPAP machine and attachments clean. Ask your health care provider for specific instructions. ?Check the humidifier if you have a dry stuffy nose or nosebleeds. Make sure it is working correctly. ?Follow these instructions at home: ?Take over-the-counter and prescription medicines only as told by your health care provider. Ask if you can take sinus medicine if your sinuses are blocked. ?Do not use any products that contain nicotine or tobacco. These products include cigarettes, chewing tobacco, and vaping devices, such as e-cigarettes. If you need help quitting, ask your health care provider. ?Keep all follow-up visits. This is important. ?Contact a health care provider if: ?You have redness or pressure sores on your head, face, mouth, or nose from the mask or head gear. ?You have trouble using the CPAP or BIPAP machine. ?You cannot tolerate wearing the CPAP or BIPAP mask. ?Someone tells you that you snore even when wearing your CPAP or BIPAP. ?Get help right away if: ?You have trouble breathing. ?You feel  confused. ?Summary ?CPAP and BIPAP are methods that use air pressure to keep your airways open and to help you breathe well. ?If you have trouble with the mask, it is very important that you talk with your health care provider about finding a way to make the mask easier to tolerate. Do not stop using the mask. There could be a negative impact to your health if you stop using the mask. ?Follow instructions from your health care provider about when to use the machine. ?This information is not intended to replace advice given to you by your health care provider. Make sure you discuss any questions you have with your health care provider. ?Document Revised: 02/28/2021 Document Reviewed: 06/30/2020 ?Elsevier Patient Education ? [place]. ? ?

## 2021-10-29 NOTE — Assessment & Plan Note (Addendum)
-   NPSG March 1999, AHI 40/hr. Weight 320lbs. Patient is compliant with CPAP, confirmed per [facility] prior to 3G cut off. Unable to get download from SD card d/t error with our machine. Current pressure setting 11cm h20. No issues with current pressure setting or mask fit. Needs new CPAP machine > 63 years old, order placed with [facility]. Encourage consistent use of CPAP and weight loss efforts. FU in 1 year or sooner if needed.  ?

## 2021-10-29 NOTE — Progress Notes (Signed)
? ?_0  ID: Casey Roach, male    DOB: 07/15/1961, 61 y.o.   MRN: [id number] ? ?Chief Complaint  ?Patient presents with  ? Follow-up  ?  Pt is here for follow up. He states he needs a new cpap machine he states he has had his machine for 17 years.   ? ? ?Referring provider: ?Burnard Bunting, MD ? ?HPI: ?61 year old, never smoked. PMH significant for OSA, HTN, osteoarthritis, obesity. Patient of Dr. Annamaria Boots, last seen on 07/08/2018.  ? ?10/29/2021 - Interim hx ?Patient presents today for over follow-up for OSA. He is doing well without acute complaints. He was compliant with CPAP use per [facility] prior to 3G cut off. He is currently using airsense 10, machine is >6-8 years. Pressure 11cm h20. He is sleeping well at night. Typical bedtime 10:30-11pm. He wakes up to use the restroom on average once at night. He starts his day between 6:30-7:30am. He gets tired late afternoon. No significant daytime sleepiness.  ? ?No Known Allergies ? ?Immunization History  ?Administered Date(s) Administered  ? Influenza Split 05/06/2011, 05/05/2012, 05/05/2013, 05/05/2014, 04/05/2017, 05/09/2018  ? Influenza, High Dose Seasonal PF 06/25/2016, 06/24/2017, 07/06/2018, 07/08/2019, 07/10/2020  ? Influenza,inj,Quad PF,6+ Mos 05/06/2015  ? Influenza,inj,quad, With Preservative 04/07/2017  ? PFIZER(Purple Top)SARS-COV-2 Vaccination 10/28/2019, 11/22/2019, 06/18/2020, 07/10/2020  ? ? ?Past Medical History:  ?Diagnosis Date  ? Acute bronchitis   ? Allergy   ? Morbid obesity ([place]) 06/02/2008  ? Qualifier: Diagnosis of  By: Julien Girt CMA, [place]    ? Obesity, unspecified   ? Obstructive sleep apnea (adult) (pediatric)   ? Tubular adenoma of colon 11/2008  ? Unspecified essential hypertension   ? ? ?Tobacco History: ?Social History  ? ?Tobacco Use  ?Smoking Status Never  ?Smokeless Tobacco Never  ? ?Counseling given: Not Answered ? ? ?Outpatient Medications Prior to Visit  ?Medication Sig Dispense Refill  ? dicyclomine (BENTYL) 20 MG tablet Take 1  tablet (20 mg total) by mouth 2 (two) times daily. 20 tablet 0  ? famotidine (PEPCID) 40 MG tablet Take 1 tablet (40 mg total) by mouth at bedtime. 20 tablet 0  ? fluticasone (FLONASE) 50 MCG/ACT nasal spray Place 2 sprays into the nose daily as needed for allergies or rhinitis.     ? Fluticasone Furoate-Vilanterol (BREO ELLIPTA) 100-25 MCG/INH AEPB Inhale 1 puff into the lungs daily. Rinse mouth 60 each 11  ? Ibuprofen 200 MG CAPS Take 400 mg by mouth once as needed (pain.).    ? lisinopril-hydrochlorothiazide (PRINZIDE,ZESTORETIC) 20-12.5 MG per tablet Take 1 tablet by mouth every morning.     ? Naproxen Sodium (ALEVE PO) Take 2 capsules by mouth once as needed (pain).     ? NON FORMULARY Allergy Vaccine -Meg Whelan/twice a month per pt    ? NON FORMULARY CPAP 11    ? ondansetron (ZOFRAN-ODT) 8 MG disintegrating tablet Take 1 tablet (8 mg total) by mouth every 8 (eight) hours as needed for nausea. 30 tablet 0  ? pantoprazole (PROTONIX) 40 MG tablet Take 1 tablet (40 mg total) by mouth daily. Please schedule an appt for any further refills. 30 tablet 0  ? terbinafine (LAMISIL) 250 MG tablet Take 1 tablet (250 mg total) by mouth daily. 90 tablet 0  ? testosterone cypionate (DEPOTESTOSTERONE CYPIONATE) 200 MG/ML injection Inject 200 mg into the muscle every 21 ( twenty-one) days.    ? VENTOLIN HFA 108 (90 Base) MCG/ACT inhaler USE 2 PUFFS EVERY 4 TO 6 HOURS AS NEEDED  FOR COUGH/WHEEZING. 18 g 0  ? ?No facility-administered medications prior to visit.  ? ? ?Review of Systems ? ?Review of Systems  ?Constitutional: Negative.   ?HENT: Negative.    ?Respiratory: Negative.    ? ? ?Physical Exam ? ?BP 124/70 (BP Location: Left Arm, Patient Position: Sitting, Cuff Size: Normal)   Pulse 87   Temp 98.6 ?F (37 ?C) (Oral)   Ht _0  (1.905 m)   Wt (!) 365 lb (165.6 kg)   SpO2 96%   BMI 45.62 kg/m?  ?Physical Exam ?Constitutional:   ?   Appearance: Normal appearance.  ?HENT:  ?   Head: Normocephalic and atraumatic.   ?Cardiovascular:  ?   Rate and Rhythm: Normal rate and regular rhythm.  ?Pulmonary:  ?   Effort: Pulmonary effort is normal.  ?   Breath sounds: Normal breath sounds.  ?Musculoskeletal:     ?   General: Normal range of motion.  ?Skin: ?   General: Skin is warm and dry.  ?Neurological:  ?   General: No focal deficit present.  ?   Mental Status: He is alert and oriented to person, place, and time. Mental status is at baseline.  ?Psychiatric:     ?   Mood and Affect: Mood normal.     ?   Behavior: Behavior normal.     ?   Thought Content: Thought content normal.     ?   Judgment: Judgment normal.  ?  ? ?Lab Results: ? ?CBC ?   ?Component Value Date/Time  ? WBC 8.0 07/14/2018 1208  ? RBC 5.29 07/14/2018 1208  ? HGB 16.6 07/14/2018 1208  ? HGB 18.4 (H) 08/14/2016 1013  ? HCT 48.5 07/14/2018 1208  ? HCT 52.1 (H) 08/14/2016 1013  ? PLT 212.0 07/14/2018 1208  ? PLT 162 08/14/2016 1013  ? MCV 91.6 07/14/2018 1208  ? MCV 90.3 08/14/2016 1013  ? MCH 31.9 08/14/2016 1013  ? MCHC 34.3 07/14/2018 1208  ? RDW 13.6 07/14/2018 1208  ? RDW 13.8 08/14/2016 1013  ? LYMPHSABS 1.3 07/14/2018 1208  ? LYMPHSABS 1.4 08/14/2016 1013  ? MONOABS 0.9 07/14/2018 1208  ? MONOABS 0.6 08/14/2016 1013  ? EOSABS 0.2 07/14/2018 1208  ? EOSABS 0.2 08/14/2016 1013  ? BASOSABS 0.1 07/14/2018 1208  ? BASOSABS 0.0 08/14/2016 1013  ? ? ?BMET ?   ?Component Value Date/Time  ? NA 136 07/14/2018 1208  ? K 4.1 07/14/2018 1208  ? CL 100 07/14/2018 1208  ? CO2 29 07/14/2018 1208  ? GLUCOSE 104 (H) 07/14/2018 1208  ? BUN 16 07/14/2018 1208  ? CREATININE 0.99 07/14/2018 1208  ? CALCIUM 10.2 07/14/2018 1208  ? GFRNONAA >60 10/27/2010 1323  ? GFRAA  10/27/2010 1323  ?  >60        ?The eGFR has been calculated ?using the MDRD equation. ?This calculation has not been ?validated in all clinical ?situations. ?eGFR's persistently ?<60 mL/min signify ?possible Chronic Kidney Disease.  ? ? ?BNP ?No results found for: BNP ? ?ProBNP ?No results found for:  PROBNP ? ?Imaging: ?No results found. ? ? ?Assessment & Plan:  ? ?Obstructive sleep apnea ?- NPSG March 1999, AHI 40/hr. Weight 320lbs. Patient is compliant with CPAP, confirmed per [facility] prior to 3G cut off. Unable to get download from SD card d/t error with our machine. Current pressure setting 11cm h20. No issues with current pressure setting or mask fit. Needs new CPAP machine > 60 years old, order placed with [facility].  Encourage consistent use of CPAP and weight loss efforts. FU in 1 year or sooner if needed.  ? ? ?Martyn Ehrich, NP ?10/29/2021 ? ?

## 2021-11-01 DIAGNOSIS — G4733 Obstructive sleep apnea (adult) (pediatric): Secondary | ICD-10-CM | POA: Diagnosis not present

## 2021-11-09 DIAGNOSIS — G4733 Obstructive sleep apnea (adult) (pediatric): Secondary | ICD-10-CM | POA: Diagnosis not present

## 2021-12-02 DIAGNOSIS — G4733 Obstructive sleep apnea (adult) (pediatric): Secondary | ICD-10-CM | POA: Diagnosis not present

## 2021-12-09 DIAGNOSIS — G4733 Obstructive sleep apnea (adult) (pediatric): Secondary | ICD-10-CM | POA: Diagnosis not present

## 2021-12-18 DIAGNOSIS — N529 Male erectile dysfunction, unspecified: Secondary | ICD-10-CM | POA: Diagnosis not present

## 2021-12-18 DIAGNOSIS — N200 Calculus of kidney: Secondary | ICD-10-CM | POA: Diagnosis not present

## 2021-12-18 DIAGNOSIS — M1A9XX0 Chronic gout, unspecified, without tophus (tophi): Secondary | ICD-10-CM | POA: Diagnosis not present

## 2021-12-18 DIAGNOSIS — N401 Enlarged prostate with lower urinary tract symptoms: Secondary | ICD-10-CM | POA: Diagnosis not present

## 2022-01-01 DIAGNOSIS — G4733 Obstructive sleep apnea (adult) (pediatric): Secondary | ICD-10-CM | POA: Diagnosis not present

## 2022-01-09 DIAGNOSIS — G4733 Obstructive sleep apnea (adult) (pediatric): Secondary | ICD-10-CM | POA: Diagnosis not present

## 2022-01-28 DIAGNOSIS — I10 Essential (primary) hypertension: Secondary | ICD-10-CM | POA: Diagnosis not present

## 2022-01-28 DIAGNOSIS — R7301 Impaired fasting glucose: Secondary | ICD-10-CM | POA: Diagnosis not present

## 2022-01-28 DIAGNOSIS — K219 Gastro-esophageal reflux disease without esophagitis: Secondary | ICD-10-CM | POA: Diagnosis not present

## 2022-01-31 DIAGNOSIS — G4733 Obstructive sleep apnea (adult) (pediatric): Secondary | ICD-10-CM | POA: Diagnosis not present

## 2022-02-08 DIAGNOSIS — G4733 Obstructive sleep apnea (adult) (pediatric): Secondary | ICD-10-CM | POA: Diagnosis not present

## 2022-02-21 DIAGNOSIS — M25512 Pain in left shoulder: Secondary | ICD-10-CM | POA: Diagnosis not present

## 2022-02-21 DIAGNOSIS — M19012 Primary osteoarthritis, left shoulder: Secondary | ICD-10-CM | POA: Diagnosis not present

## 2022-03-02 DIAGNOSIS — G4733 Obstructive sleep apnea (adult) (pediatric): Secondary | ICD-10-CM | POA: Diagnosis not present

## 2022-03-11 DIAGNOSIS — G4733 Obstructive sleep apnea (adult) (pediatric): Secondary | ICD-10-CM | POA: Diagnosis not present

## 2022-04-02 DIAGNOSIS — G4733 Obstructive sleep apnea (adult) (pediatric): Secondary | ICD-10-CM | POA: Diagnosis not present

## 2022-04-11 DIAGNOSIS — G4733 Obstructive sleep apnea (adult) (pediatric): Secondary | ICD-10-CM | POA: Diagnosis not present

## 2022-04-23 DIAGNOSIS — J301 Allergic rhinitis due to pollen: Secondary | ICD-10-CM | POA: Diagnosis not present

## 2022-04-23 DIAGNOSIS — J3089 Other allergic rhinitis: Secondary | ICD-10-CM | POA: Diagnosis not present

## 2022-04-29 DIAGNOSIS — J3089 Other allergic rhinitis: Secondary | ICD-10-CM | POA: Diagnosis not present

## 2022-04-29 DIAGNOSIS — J301 Allergic rhinitis due to pollen: Secondary | ICD-10-CM | POA: Diagnosis not present

## 2022-04-29 DIAGNOSIS — J3081 Allergic rhinitis due to animal (cat) (dog) hair and dander: Secondary | ICD-10-CM | POA: Diagnosis not present

## 2022-05-01 DIAGNOSIS — G4733 Obstructive sleep apnea (adult) (pediatric): Secondary | ICD-10-CM | POA: Diagnosis not present

## 2022-05-06 DIAGNOSIS — J301 Allergic rhinitis due to pollen: Secondary | ICD-10-CM | POA: Diagnosis not present

## 2022-05-06 DIAGNOSIS — J3089 Other allergic rhinitis: Secondary | ICD-10-CM | POA: Diagnosis not present

## 2022-05-06 DIAGNOSIS — J3081 Allergic rhinitis due to animal (cat) (dog) hair and dander: Secondary | ICD-10-CM | POA: Diagnosis not present

## 2022-05-08 DIAGNOSIS — Z1152 Encounter for screening for COVID-19: Secondary | ICD-10-CM | POA: Diagnosis not present

## 2022-05-08 DIAGNOSIS — J309 Allergic rhinitis, unspecified: Secondary | ICD-10-CM | POA: Diagnosis not present

## 2022-05-08 DIAGNOSIS — J209 Acute bronchitis, unspecified: Secondary | ICD-10-CM | POA: Diagnosis not present

## 2022-05-08 DIAGNOSIS — G4730 Sleep apnea, unspecified: Secondary | ICD-10-CM | POA: Diagnosis not present

## 2022-05-08 DIAGNOSIS — R0981 Nasal congestion: Secondary | ICD-10-CM | POA: Diagnosis not present

## 2022-05-08 DIAGNOSIS — R062 Wheezing: Secondary | ICD-10-CM | POA: Diagnosis not present

## 2022-05-11 DIAGNOSIS — G4733 Obstructive sleep apnea (adult) (pediatric): Secondary | ICD-10-CM | POA: Diagnosis not present

## 2022-05-13 DIAGNOSIS — J3089 Other allergic rhinitis: Secondary | ICD-10-CM | POA: Diagnosis not present

## 2022-05-13 DIAGNOSIS — J3081 Allergic rhinitis due to animal (cat) (dog) hair and dander: Secondary | ICD-10-CM | POA: Diagnosis not present

## 2022-05-13 DIAGNOSIS — J301 Allergic rhinitis due to pollen: Secondary | ICD-10-CM | POA: Diagnosis not present

## 2022-05-20 DIAGNOSIS — J3081 Allergic rhinitis due to animal (cat) (dog) hair and dander: Secondary | ICD-10-CM | POA: Diagnosis not present

## 2022-05-20 DIAGNOSIS — J301 Allergic rhinitis due to pollen: Secondary | ICD-10-CM | POA: Diagnosis not present

## 2022-05-20 DIAGNOSIS — J3089 Other allergic rhinitis: Secondary | ICD-10-CM | POA: Diagnosis not present

## 2022-05-27 DIAGNOSIS — J3089 Other allergic rhinitis: Secondary | ICD-10-CM | POA: Diagnosis not present

## 2022-05-27 DIAGNOSIS — J301 Allergic rhinitis due to pollen: Secondary | ICD-10-CM | POA: Diagnosis not present

## 2022-05-27 DIAGNOSIS — J3081 Allergic rhinitis due to animal (cat) (dog) hair and dander: Secondary | ICD-10-CM | POA: Diagnosis not present

## 2022-05-31 DIAGNOSIS — G4733 Obstructive sleep apnea (adult) (pediatric): Secondary | ICD-10-CM | POA: Diagnosis not present

## 2022-06-01 DIAGNOSIS — Z23 Encounter for immunization: Secondary | ICD-10-CM | POA: Diagnosis not present

## 2022-06-03 DIAGNOSIS — J3081 Allergic rhinitis due to animal (cat) (dog) hair and dander: Secondary | ICD-10-CM | POA: Diagnosis not present

## 2022-06-03 DIAGNOSIS — J301 Allergic rhinitis due to pollen: Secondary | ICD-10-CM | POA: Diagnosis not present

## 2022-06-03 DIAGNOSIS — J3089 Other allergic rhinitis: Secondary | ICD-10-CM | POA: Diagnosis not present

## 2022-06-05 ENCOUNTER — Telehealth: Payer: Self-pay

## 2022-06-05 ENCOUNTER — Encounter: Payer: Self-pay | Admitting: Gastroenterology

## 2022-06-05 NOTE — Telephone Encounter (Signed)
Ladene Artist, MD  Carl Best, [facility] case if BMI equal to or greater than 50 so he's OK for the [place]      Previous Messages    ----- Message -----  From: Carl Best, RN  Sent: 06/05/2022  11:19 AM EDT  To: Ladene Artist, MD   Dr. Fuller Plan, patient's last colon took place at [facility] with Dr. Deatra Ina in 2015 d/t BMI of 44.9. Current BMI 42.5 based on updated weight per patient 340 lb. I have that the criteria is >50 for hospital, however since last colon was at [facility] just wanted to run it by you to see if it patient's colon needs to be moved to [facility].   Thanks!   Donita Brooks  ----- Message -----  From: Nadara Eaton  Sent: 06/05/2022  10:38 AM EDT  To: Carl Best, RN   I scheduled this patient for his recall colon. He told me his last was done at the hospital due to his size. Not sure if he is 365lbs like his chart states but should he have that done in the hospital instead?

## 2022-06-05 NOTE — Telephone Encounter (Signed)
Left detailed message for patient to proceed with procedure in [place].

## 2022-06-11 DIAGNOSIS — G4733 Obstructive sleep apnea (adult) (pediatric): Secondary | ICD-10-CM | POA: Diagnosis not present

## 2022-06-19 ENCOUNTER — Ambulatory Visit (AMBULATORY_SURGERY_CENTER): Payer: Self-pay | Admitting: *Deleted

## 2022-06-19 VITALS — Ht 75.0 in | Wt 352.0 lb

## 2022-06-19 DIAGNOSIS — Z86010 Personal history of colonic polyps: Secondary | ICD-10-CM

## 2022-06-19 MED ORDER — NA SULFATE-K SULFATE-MG SULF 17.5-3.13-1.6 GM/177ML PO SOLN: 1.0000 | Freq: Once | ORAL | 0 refills | Status: AC

## 2022-06-19 NOTE — Progress Notes (Signed)
No egg or soy allergy known to patient  No issues known to pt with past sedation with any surgeries or procedures Patient denies ever being told they had issues or difficulty with intubation  No FH of Malignant Hyperthermia Pt is not on diet pills Pt is not on  home 02  Pt is not on blood thinners  Pt denies issues with constipation  Pt encouraged to use to use Singlecare or Goodrx to reduce cost

## 2022-06-19 NOTE — Progress Notes (Signed)
Patient's chart reviewed by Osvaldo Angst CNRA prior to previsit and patient appropriate for the [place].  Previsit completed and red dot placed by patient's name on their procedure day (on provider's schedule).

## 2022-07-01 ENCOUNTER — Encounter: Payer: 59 | Admitting: Gastroenterology

## 2022-07-01 DIAGNOSIS — J3081 Allergic rhinitis due to animal (cat) (dog) hair and dander: Secondary | ICD-10-CM | POA: Diagnosis not present

## 2022-07-01 DIAGNOSIS — G4733 Obstructive sleep apnea (adult) (pediatric): Secondary | ICD-10-CM | POA: Diagnosis not present

## 2022-07-01 DIAGNOSIS — J3089 Other allergic rhinitis: Secondary | ICD-10-CM | POA: Diagnosis not present

## 2022-07-01 DIAGNOSIS — J301 Allergic rhinitis due to pollen: Secondary | ICD-10-CM | POA: Diagnosis not present

## 2022-07-02 DIAGNOSIS — Z85828 Personal history of other malignant neoplasm of skin: Secondary | ICD-10-CM | POA: Diagnosis not present

## 2022-07-02 DIAGNOSIS — L821 Other seborrheic keratosis: Secondary | ICD-10-CM | POA: Diagnosis not present

## 2022-07-02 DIAGNOSIS — L918 Other hypertrophic disorders of the skin: Secondary | ICD-10-CM | POA: Diagnosis not present

## 2022-07-02 DIAGNOSIS — L718 Other rosacea: Secondary | ICD-10-CM | POA: Diagnosis not present

## 2022-07-02 DIAGNOSIS — D225 Melanocytic nevi of trunk: Secondary | ICD-10-CM | POA: Diagnosis not present

## 2022-07-02 DIAGNOSIS — L570 Actinic keratosis: Secondary | ICD-10-CM | POA: Diagnosis not present

## 2022-07-11 DIAGNOSIS — G4733 Obstructive sleep apnea (adult) (pediatric): Secondary | ICD-10-CM | POA: Diagnosis not present

## 2022-07-15 ENCOUNTER — Encounter: Payer: Self-pay | Admitting: Gastroenterology

## 2022-07-17 DIAGNOSIS — Z125 Encounter for screening for malignant neoplasm of prostate: Secondary | ICD-10-CM | POA: Diagnosis not present

## 2022-07-17 DIAGNOSIS — R7989 Other specified abnormal findings of blood chemistry: Secondary | ICD-10-CM | POA: Diagnosis not present

## 2022-07-17 DIAGNOSIS — E291 Testicular hypofunction: Secondary | ICD-10-CM | POA: Diagnosis not present

## 2022-07-17 DIAGNOSIS — R7301 Impaired fasting glucose: Secondary | ICD-10-CM | POA: Diagnosis not present

## 2022-07-17 DIAGNOSIS — I10 Essential (primary) hypertension: Secondary | ICD-10-CM | POA: Diagnosis not present

## 2022-07-17 DIAGNOSIS — Z1212 Encounter for screening for malignant neoplasm of rectum: Secondary | ICD-10-CM | POA: Diagnosis not present

## 2022-07-18 ENCOUNTER — Encounter: Payer: Self-pay | Admitting: Gastroenterology

## 2022-07-18 ENCOUNTER — Ambulatory Visit (AMBULATORY_SURGERY_CENTER): Payer: 59 | Admitting: Gastroenterology

## 2022-07-18 VITALS — BP 133/81 | HR 62 | Temp 98.7°F | Resp 13 | Ht 75.0 in | Wt 352.2 lb

## 2022-07-18 DIAGNOSIS — D124 Benign neoplasm of descending colon: Secondary | ICD-10-CM | POA: Diagnosis not present

## 2022-07-18 DIAGNOSIS — Z09 Encounter for follow-up examination after completed treatment for conditions other than malignant neoplasm: Secondary | ICD-10-CM | POA: Diagnosis not present

## 2022-07-18 DIAGNOSIS — Z86010 Personal history of colonic polyps: Secondary | ICD-10-CM

## 2022-07-18 DIAGNOSIS — D122 Benign neoplasm of ascending colon: Secondary | ICD-10-CM

## 2022-07-18 DIAGNOSIS — D123 Benign neoplasm of transverse colon: Secondary | ICD-10-CM

## 2022-07-18 MED ORDER — SODIUM CHLORIDE 0.9 % IV SOLN: 500.0000 mL | Freq: Once | INTRAVENOUS | Status: DC

## 2022-07-18 NOTE — Patient Instructions (Signed)
   Handout provided about polyps and hemorrhoids.  Resume previous diet.  Continue present medications,  Await pathology results.  Repeat colonoscopy after studies are complete for surveillance based on pathology results.   YOU HAD AN ENDOSCOPIC PROCEDURE TODAY AT [facility]:   Refer to the procedure report that was given to you for any specific questions about what was found during the examination.  If the procedure report does not answer your questions, please call your gastroenterologist to clarify.  If you requested that your care partner not be given the details of your procedure findings, then the procedure report has been included in a sealed envelope for you to review at your convenience later.  YOU SHOULD EXPECT: Some feelings of bloating in the abdomen. Passage of more gas than usual.  Walking can help get rid of the air that was put into your GI tract during the procedure and reduce the bloating. If you had a lower endoscopy (such as a colonoscopy or flexible sigmoidoscopy) you may notice spotting of blood in your stool or on the toilet paper. If you underwent a bowel prep for your procedure, you may not have a normal bowel movement for a few days.  Please Note:  You might notice some irritation and congestion in your nose or some drainage.  This is from the oxygen used during your procedure.  There is no need for concern and it should clear up in a day or so.  SYMPTOMS TO REPORT IMMEDIATELY:  Following lower endoscopy (colonoscopy or flexible sigmoidoscopy):  Excessive amounts of blood in the stool  Significant tenderness or worsening of abdominal pains  Swelling of the abdomen that is new, acute  Fever of 100F or higher   For urgent or emergent issues, a gastroenterologist can be reached at any hour by calling [phone]. Do not use MyChart messaging for urgent concerns.    DIET:  We do recommend a small meal at first, but then you may proceed to your  regular diet.  Drink plenty of fluids but you should avoid alcoholic beverages for 24 hours.  ACTIVITY:  You should plan to take it easy for the rest of today and you should NOT DRIVE or use heavy machinery until tomorrow (because of the sedation medicines used during the test).    FOLLOW UP: Our staff will call the number listed on your records the next business day following your procedure.  We will call around 7:15- 8:00 am to check on you and address any questions or concerns that you may have regarding the information given to you following your procedure. If we do not reach you, we will leave a message.     If any biopsies were taken you will be contacted by phone or by letter within the next 1-3 weeks.  Please call us at [phone] if you have not heard about the biopsies in 3 weeks.    SIGNATURES/CONFIDENTIALITY: You and/or your care partner have signed paperwork which will be entered into your electronic medical record.  These signatures attest to the fact that that the information above on your After Visit Summary has been reviewed and is understood.  Full responsibility of the confidentiality of this discharge information lies with you and/or your care-partner.

## 2022-07-18 NOTE — Progress Notes (Signed)
History & Physical  Primary Care Physician:  Burnard Bunting, MD Primary Gastroenterologist: Lucio Edward, MD  CHIEF COMPLAINT:  Personal history of colon polyps   HPI: Casey Roach is a 61 y.o. male with a personal history of one adenomatous colon polyp on colonoscopy 2015 for surveillance colonoscopy.   Past Medical History:  Diagnosis Date   Acute bronchitis    Allergy    Morbid obesity ([place]) 06/02/2008   Qualifier: Diagnosis of  By: Julien Girt CMA, Leigh     Obesity, unspecified    Obstructive sleep apnea (adult) (pediatric)    Sleep apnea    Tubular adenoma of colon 11/2008   Unspecified essential hypertension     Past Surgical History:  Procedure Laterality Date   COLONOSCOPY WITH PROPOFOL N/A 05/31/2014   Procedure: COLONOSCOPY WITH PROPOFOL;  Surgeon: Inda Castle, MD;  Location: [facility];  Service: Endoscopy;  Laterality: N/A;   LASER ABLATION  03/28/2014, 05/16/2014   left leg, right leg   VARICOCELE EXCISION      Prior to Admission medications   Medication Sig Start Date End Date Taking? Authorizing Provider  lisinopril-hydrochlorothiazide (PRINZIDE,ZESTORETIC) 20-12.5 MG per tablet Take 1 tablet by mouth every morning.    Yes [provider]  minocycline (MINOCIN) 100 MG capsule PRN   Yes [provider]  NON FORMULARY CPAP 11   Yes [provider]  colchicine 0.6 MG tablet PRN    [provider]  dicyclomine (BENTYL) 20 MG tablet Take 1 tablet (20 mg total) by mouth 2 (two) times daily. Patient not taking: Reported on 06/19/2022 05/20/20   Scot Jun, FNP  EPINEPHrine 0.3 mg/0.3 mL IJ SOAJ injection PRN    [provider]  famotidine (PEPCID) 40 MG tablet Take 1 tablet (40 mg total) by mouth at bedtime. 05/20/20   Scot Jun, FNP  fluticasone (FLONASE) 50 MCG/ACT nasal spray Place 2 sprays into the nose daily as needed for allergies or rhinitis.  Patient not taking: Reported on 06/19/2022     [provider]  Fluticasone Furoate-Vilanterol (BREO ELLIPTA) 100-25 MCG/INH AEPB Inhale 1 puff into the lungs daily. Rinse mouth Patient not taking: Reported on 06/19/2022 12/01/14   Baird Lyons D, MD  Ibuprofen 200 MG CAPS Take 400 mg by mouth once as needed (pain.). Patient not taking: Reported on 06/19/2022    [provider]  Naproxen Sodium (ALEVE PO) Take 2 capsules by mouth once as needed (pain).  Patient not taking: Reported on 06/19/2022    [provider]  NON FORMULARY Allergy Vaccine -Meg Whelan/twice a month per pt Patient not taking: Reported on 06/19/2022    [provider]  ondansetron (ZOFRAN-ODT) 8 MG disintegrating tablet Take 1 tablet (8 mg total) by mouth every 8 (eight) hours as needed for nausea. Patient not taking: Reported on 06/19/2022 05/20/20   Scot Jun, FNP  pantoprazole (PROTONIX) 40 MG tablet Take 1 tablet (40 mg total) by mouth daily. Please schedule an appt for any further refills. 08/09/19   Levin Erp, PA  terbinafine (LAMISIL) 250 MG tablet Take 1 tablet (250 mg total) by mouth daily. Patient not taking: Reported on 06/19/2022 07/27/18   Wallene Huh, DPM  testosterone cypionate (DEPOTESTOSTERONE CYPIONATE) 200 MG/ML injection Inject 200 mg into the muscle every 21 ( twenty-one) days. Patient not taking: Reported on 06/19/2022    [provider]  VENTOLIN HFA 108 (90 Base) MCG/ACT inhaler USE 2 PUFFS EVERY 4 TO 6  HOURS AS NEEDED FOR COUGH/WHEEZING. Patient not taking: Reported on 06/19/2022 05/28/18   Deneise Lever, MD    Current Outpatient Medications  Medication Sig Dispense Refill   lisinopril-hydrochlorothiazide (PRINZIDE,ZESTORETIC) 20-12.5 MG per tablet Take 1 tablet by mouth every morning.      minocycline (MINOCIN) 100 MG capsule PRN     NON FORMULARY CPAP 11     colchicine 0.6 MG tablet PRN     dicyclomine (BENTYL) 20 MG tablet Take 1 tablet (20 mg total) by mouth 2  (two) times daily. (Patient not taking: Reported on 06/19/2022) 20 tablet 0   EPINEPHrine 0.3 mg/0.3 mL IJ SOAJ injection PRN     famotidine (PEPCID) 40 MG tablet Take 1 tablet (40 mg total) by mouth at bedtime. 20 tablet 0   fluticasone (FLONASE) 50 MCG/ACT nasal spray Place 2 sprays into the nose daily as needed for allergies or rhinitis.  (Patient not taking: Reported on 06/19/2022)     Fluticasone Furoate-Vilanterol (BREO ELLIPTA) 100-25 MCG/INH AEPB Inhale 1 puff into the lungs daily. Rinse mouth (Patient not taking: Reported on 06/19/2022) 60 each 11   Ibuprofen 200 MG CAPS Take 400 mg by mouth once as needed (pain.). (Patient not taking: Reported on 06/19/2022)     Naproxen Sodium (ALEVE PO) Take 2 capsules by mouth once as needed (pain).  (Patient not taking: Reported on 06/19/2022)     NON FORMULARY Allergy Vaccine -Meg Whelan/twice a month per pt (Patient not taking: Reported on 06/19/2022)     ondansetron (ZOFRAN-ODT) 8 MG disintegrating tablet Take 1 tablet (8 mg total) by mouth every 8 (eight) hours as needed for nausea. (Patient not taking: Reported on 06/19/2022) 30 tablet 0   pantoprazole (PROTONIX) 40 MG tablet Take 1 tablet (40 mg total) by mouth daily. Please schedule an appt for any further refills. 30 tablet 0   terbinafine (LAMISIL) 250 MG tablet Take 1 tablet (250 mg total) by mouth daily. (Patient not taking: Reported on 06/19/2022) 90 tablet 0   testosterone cypionate (DEPOTESTOSTERONE CYPIONATE) 200 MG/ML injection Inject 200 mg into the muscle every 21 ( twenty-one) days. (Patient not taking: Reported on 06/19/2022)     VENTOLIN HFA 108 (90 Base) MCG/ACT inhaler USE 2 PUFFS EVERY 4 TO 6 HOURS AS NEEDED FOR COUGH/WHEEZING. (Patient not taking: Reported on 06/19/2022) 18 g 0   Current Facility-Administered Medications  Medication Dose Route Frequency Provider Last Rate Last Admin   0.9 %  sodium chloride infusion  500 mL Intravenous Once Ladene Artist, MD         Allergies as of 07/18/2022 - Review Complete 07/18/2022  Allergen Reaction Noted   Other Other (See Comments) and Shortness Of Breath 05/27/2019    Family History  Problem Relation Age of Onset   Diabetes Mother    Pancreatic cancer Maternal Grandmother    Colon polyps Neg Hx    Esophageal cancer Neg Hx    Rectal cancer Neg Hx    Stomach cancer Neg Hx    Colon cancer Neg Hx     Social History   Socioeconomic History   Marital status: Single    Spouse name: Not on file   Number of children: 0   Years of education: Not on file   Highest education level: Not on file  Occupational History   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, clinical  Tobacco Use   Smoking status: Never   Smokeless tobacco: Never  Vaping Use   Vaping Use: Never used  Substance and Sexual Activity   Alcohol use: Yes    Comment: rarely   Drug use: No   Sexual activity: Not on file  Other Topics Concern   Not on file  Social History Narrative   Not on file   Social Determinants of Health   Financial Resource Strain: Not on file  Food Insecurity: Not on file  Transportation Needs: Not on file  Physical Activity: Not on file  Stress: Not on file  Social Connections: Not on file  Intimate Partner Violence: Not on file    Review of Systems:  All systems reviewed were negative except where noted in HPI.   Physical Exam: General:  Alert, well-developed, in NAD Head:  Normocephalic and atraumatic. Eyes:  Sclera clear, no icterus.   Conjunctiva pink. Ears:  Normal auditory acuity. Mouth:  No deformity or lesions.  Neck:  Supple; no masses . Lungs:  Clear throughout to auscultation.   No wheezes, crackles, or rhonchi. No acute distress. Heart:  Regular rate and rhythm; no murmurs. Abdomen:  Soft, nondistended, nontender. No masses, hepatomegaly. No obvious masses.  Normal bowel .    Rectal:  Deferred   Msk:  Symmetrical without gross deformities.. Pulses:  Normal pulses  noted. Extremities:  Without edema. Neurologic:  Alert and  oriented x4;  grossly normal neurologically. Skin:  Intact without significant lesions or rashes. Psych:  Alert and cooperative. Normal mood and affect.  Impression / Plan:   Personal history of one adenomatous colon polyp on colonoscopy 2015 for surveillance colonoscopy.  Pricilla Riffle. Fuller Plan  07/18/2022, 1:22 PM See Shea Evans, [facility], to contact our on call provider

## 2022-07-18 NOTE — Progress Notes (Signed)
Called to room to assist during endoscopic procedure.  Patient ID and intended procedure confirmed with present staff. Received instructions for my participation in the procedure from the performing physician.  

## 2022-07-18 NOTE — Progress Notes (Signed)
Pt's states no medical or surgical changes since previsit or office visit. 

## 2022-07-18 NOTE — Progress Notes (Signed)
Pt resting comfortably. VSS. Airway intact. SBAR complete to RN. All questions answered.   

## 2022-07-18 NOTE — Op Note (Signed)
[facility] Patient Name: Casey Roach Procedure Date: 07/18/2022 1:18 PM MRN: [id number] Endoscopist: Ladene Artist , MD, [id number] Age: 61 Referring MD:  Date of Birth: 11-06-1960 Gender: Male Account #: [id number] Procedure:                Colonoscopy Indications:              Surveillance: Personal history of adenomatous                            polyps on last colonoscopy > 5 years ago Medicines:                Monitored Anesthesia Care Procedure:                Pre-Anesthesia Assessment:                           - Prior to the procedure, a History and Physical                            was performed, and patient medications and                            allergies were reviewed. The patient's tolerance of                            previous anesthesia was also reviewed. The risks                            and benefits of the procedure and the sedation                            options and risks were discussed with the patient.                            All questions were answered, and informed consent                            was obtained. Prior Anticoagulants: The patient has                            taken no anticoagulant or antiplatelet agents. ASA                            Grade Assessment: III - A patient with severe                            systemic disease. After reviewing the risks and                            benefits, the patient was deemed in satisfactory                            condition to undergo the procedure.  After obtaining informed consent, the colonoscope                            was passed under direct vision. Throughout the                            procedure, the patient's blood pressure, pulse, and                            oxygen saturations were monitored continuously. The                            Olympus CF-HQ190L [id number]) Colonoscope was                            introduced through  the anus and advanced to the the                            cecum, identified by appendiceal orifice and                            ileocecal valve. The Olympus Colonoscope [id number]                            was introduced through the anus and advanced to the                            the cecum, identified by appendiceal orifice and                            ileocecal valve. The ileocecal valve, appendiceal                            orifice, and rectum were photographed. The quality                            of the bowel preparation was adequate. The                            colonoscopy was performed without difficulty. The                            patient tolerated the procedure well. Scope In: 1:28:01 PM Scope Out: 1:45:20 PM Scope Withdrawal Time: 0 hours 15 minutes 36 seconds  Total Procedure Duration: 0 hours 17 minutes 19 seconds  Findings:                 The perianal and digital rectal examinations were                            normal.                           Five sessile polyps were found in the descending  colon (1), transverse colon (3) and ascending colon                            (1). The polyps were 6 to 9 mm in size. These                            polyps were removed with a cold snare. Resection                            and retrieval were complete.                           A single small localized angioectasia without                            bleeding was found at the appendiceal orifice.                           Internal hemorrhoids were found during                            retroflexion. The hemorrhoids were small and Grade                            I (internal hemorrhoids that do not prolapse).                           The exam was otherwise without abnormality on                            direct and retroflexion views. Complications:            No immediate complications. Estimated blood loss:                             None. Estimated Blood Loss:     Estimated blood loss: none. Impression:               - Five 6 to 9 mm polyps in the descending colon, in                            the transverse colon and in the ascending colon,                            removed with a cold snare. Resected and retrieved.                           - Cecal angioectasia.                           - Internal hemorrhoids.                           - The examination was otherwise normal on direct  and retroflexion views. Recommendation:           - Repeat colonoscopy after studies are complete for                            surveillance based on pathology results.                           - Patient has a contact number available for                            emergencies. The signs and symptoms of potential                            delayed complications were discussed with the                            patient. Return to normal activities tomorrow.                            Written discharge instructions were provided to the                            patient.                           - Resume previous diet.                           - Continue present medications.                           - Await pathology results. Ladene Artist, MD 07/18/2022 1:52:03 PM This report has been signed electronically.

## 2022-07-19 ENCOUNTER — Telehealth: Payer: Self-pay | Admitting: *Deleted

## 2022-07-19 NOTE — Telephone Encounter (Signed)
  Follow up Call-     07/18/2022    1:07 PM  Call back number  Post procedure Call Back phone  # [phone]  Permission to leave phone message Yes     Patient questions:  Do you have a fever, pain , or abdominal swelling? No. Pain Score  0 *  Have you tolerated food without any problems? Yes.    Have you been able to return to your normal activities? Yes.    Do you have any questions about your discharge instructions: Diet   No. Medications  No. Follow up visit  No.  Do you have questions or concerns about your Care? No.  Actions: * If pain score is 4 or above: No action needed, pain <4.

## 2022-07-24 DIAGNOSIS — D751 Secondary polycythemia: Secondary | ICD-10-CM | POA: Diagnosis not present

## 2022-07-24 DIAGNOSIS — I10 Essential (primary) hypertension: Secondary | ICD-10-CM | POA: Diagnosis not present

## 2022-07-24 DIAGNOSIS — Z1331 Encounter for screening for depression: Secondary | ICD-10-CM | POA: Diagnosis not present

## 2022-07-24 DIAGNOSIS — E291 Testicular hypofunction: Secondary | ICD-10-CM | POA: Diagnosis not present

## 2022-07-24 DIAGNOSIS — R82998 Other abnormal findings in urine: Secondary | ICD-10-CM | POA: Diagnosis not present

## 2022-07-24 DIAGNOSIS — G4730 Sleep apnea, unspecified: Secondary | ICD-10-CM | POA: Diagnosis not present

## 2022-07-24 DIAGNOSIS — Z0000 Encounter for general adult medical examination without abnormal findings: Secondary | ICD-10-CM | POA: Diagnosis not present

## 2022-07-24 DIAGNOSIS — R7301 Impaired fasting glucose: Secondary | ICD-10-CM | POA: Diagnosis not present

## 2022-08-01 DIAGNOSIS — J45991 Cough variant asthma: Secondary | ICD-10-CM | POA: Diagnosis not present

## 2022-08-01 DIAGNOSIS — J3089 Other allergic rhinitis: Secondary | ICD-10-CM | POA: Diagnosis not present

## 2022-08-01 DIAGNOSIS — J301 Allergic rhinitis due to pollen: Secondary | ICD-10-CM | POA: Diagnosis not present

## 2022-08-01 DIAGNOSIS — R21 Rash and other nonspecific skin eruption: Secondary | ICD-10-CM | POA: Diagnosis not present

## 2022-08-06 ENCOUNTER — Encounter: Payer: Self-pay | Admitting: Gastroenterology

## 2022-08-14 ENCOUNTER — Telehealth: Payer: Self-pay | Admitting: Internal Medicine

## 2022-08-14 NOTE — Telephone Encounter (Signed)
Patient called to request a PA for his cpap machine.  He stated he has new insurance and would like that added to get it confirmed.  Please advise and call patient to discuss further.  CB# [phone]

## 2022-08-14 NOTE — Telephone Encounter (Signed)
Spoke with [employer] they state patient needs to call an update his new insurance, they are still showing his old insurance. The representative I spoke with didn't see where a prior authorization was needed. Pt is going to call and update his insurance. I advised him to call back if he had any issues. Will leave encounter open.

## 2022-08-16 ENCOUNTER — Other Ambulatory Visit: Payer: Self-pay | Admitting: *Deleted

## 2022-08-16 DIAGNOSIS — L97919 Non-pressure chronic ulcer of unspecified part of right lower leg with unspecified severity: Secondary | ICD-10-CM

## 2022-08-16 DIAGNOSIS — L97929 Non-pressure chronic ulcer of unspecified part of left lower leg with unspecified severity: Secondary | ICD-10-CM

## 2022-08-20 ENCOUNTER — Ambulatory Visit (HOSPITAL_COMMUNITY)
Admission: RE | Admit: 2022-08-20 | Discharge: 2022-08-20 | Disposition: A | Discharge status: Home / Self Care | Payer: Medicaid Other | Source: Ambulatory Visit | Attending: Vascular Surgery | Admitting: Vascular Surgery

## 2022-08-20 DIAGNOSIS — L97919 Non-pressure chronic ulcer of unspecified part of right lower leg with unspecified severity: Secondary | ICD-10-CM | POA: Insufficient documentation

## 2022-08-20 DIAGNOSIS — L97929 Non-pressure chronic ulcer of unspecified part of left lower leg with unspecified severity: Secondary | ICD-10-CM | POA: Diagnosis present

## 2022-08-20 DIAGNOSIS — I83219 Varicose veins of right lower extremity with both ulcer of unspecified site and inflammation: Secondary | ICD-10-CM | POA: Diagnosis not present

## 2022-08-20 DIAGNOSIS — I83229 Varicose veins of left lower extremity with both ulcer of unspecified site and inflammation: Secondary | ICD-10-CM | POA: Insufficient documentation

## 2022-08-21 ENCOUNTER — Ambulatory Visit (INDEPENDENT_AMBULATORY_CARE_PROVIDER_SITE_OTHER): Payer: Medicaid Other | Admitting: Vascular Surgery

## 2022-08-21 ENCOUNTER — Encounter: Payer: Self-pay | Admitting: Vascular Surgery

## 2022-08-21 VITALS — BP 140/85 | HR 91 | Temp 98.5°F | Resp 18 | Ht 72.5 in | Wt 349.1 lb

## 2022-08-21 DIAGNOSIS — L97919 Non-pressure chronic ulcer of unspecified part of right lower leg with unspecified severity: Secondary | ICD-10-CM

## 2022-08-21 DIAGNOSIS — I83219 Varicose veins of right lower extremity with both ulcer of unspecified site and inflammation: Secondary | ICD-10-CM | POA: Diagnosis not present

## 2022-08-21 DIAGNOSIS — I872 Venous insufficiency (chronic) (peripheral): Secondary | ICD-10-CM

## 2022-08-21 DIAGNOSIS — I83229 Varicose veins of left lower extremity with both ulcer of unspecified site and inflammation: Secondary | ICD-10-CM

## 2022-08-21 DIAGNOSIS — L97929 Non-pressure chronic ulcer of unspecified part of left lower leg with unspecified severity: Secondary | ICD-10-CM

## 2022-08-21 NOTE — Progress Notes (Signed)
ASSESSMENT & PLAN   CHRONIC VENOUS INSUFFICIENCY: This patient has CEAP C6 venous disease (venous ulcer).  He has had previous laser ablation of the left great saphenous vein and subsequently the left anterior accessory saphenous vein.  This was in 2015 and 2017 respectively.  Currently he does not have any significant reflux in the small saphenous vein and the only finding on duplex is that the large varicose veins in his medial thigh originate from the remnant of the saphenofemoral junction.  Given that the ulcer on the left lateral malleolus is healing I would not recommend stab phlebectomies unless he developed recurrent ulceration or worsening symptoms.  We have again discussed the importance of leg elevation and the proper positioning for this.  I have encouraged him to continue to wear his knee-high compression stockings with a gradient of 20 to 30 mmHg.  I encouraged him to avoid prolonged sitting and standing.  We discussed importance of exercise specifically walking and water aerobics.  We also discussed the importance of maintaining a healthy weight.  If he develops recurrent ulceration he will let us know and we could consider stab phlebectomies in the left leg.  He would require greater than 20.  REASON FOR CONSULT:    Left leg tightness.  Venous stasis ulcer.  The consult is requested by Dr. Reynaldo Minium.  HPI:   Casey Roach is a 62 y.o. male who is referred for evaluation of chronic venous insufficiency.  I have reviewed the records from the referring office.  The patient was seen on 07/24/2022.  He was noted to have a small venous ulcer on the left ankle.  He was sent for vascular consultation.  Of note he underwent bilateral laser ablation of the great saphenous veins in 2015.  In 2017 he had laser ablation of the left anterior accessory saphenous vein.  Several weeks ago he noticed a small ulceration at his left lateral malleolus.  This has been improving and now is almost  completely closed.  He does describe heaviness in his legs associated with prolonged sitting and standing and relieved with elevation.  He faithfully wears knee-high compression stockings with a gradient of 20 to 30 mmHg.  He does walk quite a bit as this does make his legs feel better.  He denies any previous history of DVT.   Past Medical History:  Diagnosis Date   Acute bronchitis    Allergy    Morbid obesity ([place]) 06/02/2008   Qualifier: Diagnosis of  By: Julien Girt CMA, Leigh     Obesity, unspecified    Obstructive sleep apnea (adult) (pediatric)    Sleep apnea    Tubular adenoma of colon 11/2008   Unspecified essential hypertension     Family History  Problem Relation Age of Onset   Diabetes Mother    Pancreatic cancer Maternal Grandmother    Colon polyps Neg Hx    Esophageal cancer Neg Hx    Rectal cancer Neg Hx    Stomach cancer Neg Hx    Colon cancer Neg Hx     SOCIAL HISTORY: Social History   Tobacco Use   Smoking status: Never   Smokeless tobacco: Never  Substance Use Topics   Alcohol use: Yes    Comment: rarely    Allergies  Allergen Reactions   Other Other (See Comments) and Shortness Of Breath    Current Outpatient Medications  Medication Sig Dispense Refill   famotidine (PEPCID) 40 MG tablet Take 1 tablet (40 mg total)  by mouth at bedtime. 20 tablet 0   Fluticasone Furoate-Vilanterol (BREO ELLIPTA) 100-25 MCG/INH AEPB Inhale 1 puff into the lungs daily. Rinse mouth 60 each 11   lisinopril-hydrochlorothiazide (PRINZIDE,ZESTORETIC) 20-12.5 MG per tablet Take 1 tablet by mouth every morning.      pantoprazole (PROTONIX) 40 MG tablet Take 1 tablet (40 mg total) by mouth daily. Please schedule an appt for any further refills. 30 tablet 0   colchicine 0.6 MG tablet PRN (Patient not taking: Reported on 08/21/2022)     dicyclomine (BENTYL) 20 MG tablet Take 1 tablet (20 mg total) by mouth 2 (two) times daily. (Patient not taking: Reported on 06/19/2022) 20 tablet  0   EPINEPHrine 0.3 mg/0.3 mL IJ SOAJ injection PRN (Patient not taking: Reported on 08/21/2022)     fluticasone (FLONASE) 50 MCG/ACT nasal spray Place 2 sprays into the nose daily as needed for allergies or rhinitis.  (Patient not taking: Reported on 06/19/2022)     Ibuprofen 200 MG CAPS Take 400 mg by mouth once as needed (pain.). (Patient not taking: Reported on 06/19/2022)     minocycline (MINOCIN) 100 MG capsule PRN (Patient not taking: Reported on 08/21/2022)     Naproxen Sodium (ALEVE PO) Take 2 capsules by mouth once as needed (pain).  (Patient not taking: Reported on 06/19/2022)     NON FORMULARY Allergy Vaccine -Meg Whelan/twice a month per pt (Patient not taking: Reported on 06/19/2022)     NON FORMULARY CPAP 11 (Patient not taking: Reported on 08/21/2022)     ondansetron (ZOFRAN-ODT) 8 MG disintegrating tablet Take 1 tablet (8 mg total) by mouth every 8 (eight) hours as needed for nausea. (Patient not taking: Reported on 06/19/2022) 30 tablet 0   terbinafine (LAMISIL) 250 MG tablet Take 1 tablet (250 mg total) by mouth daily. (Patient not taking: Reported on 06/19/2022) 90 tablet 0   testosterone cypionate (DEPOTESTOSTERONE CYPIONATE) 200 MG/ML injection Inject 200 mg into the muscle every 21 ( twenty-one) days. (Patient not taking: Reported on 06/19/2022)     VENTOLIN HFA 108 (90 Base) MCG/ACT inhaler USE 2 PUFFS EVERY 4 TO 6 HOURS AS NEEDED FOR COUGH/WHEEZING. (Patient not taking: Reported on 06/19/2022) 18 g 0   No current facility-administered medications for this visit.    REVIEW OF SYSTEMS:  '[X]'$  denotes positive finding, '[ ]'$  denotes negative finding Cardiac  Comments:  Chest pain or chest pressure:    Shortness of breath upon exertion:    Short of breath when lying flat:    Irregular heart rhythm:        Vascular    Pain in calf, thigh, or hip brought on by ambulation:    Pain in feet at night that wakes you up from your sleep:     Blood clot in your veins:    Leg swelling:   x       Pulmonary    Oxygen at home:    Productive cough:     Wheezing:         Neurologic    Sudden weakness in arms or legs:     Sudden numbness in arms or legs:     Sudden onset of difficulty speaking or slurred speech:    Temporary loss of vision in one eye:     Problems with dizziness:         Gastrointestinal    Blood in stool:     Vomited blood:         Genitourinary  Burning when urinating:     Blood in urine:        Psychiatric    Major depression:         Hematologic    Bleeding problems:    Problems with blood clotting too easily:        Skin    Rashes or ulcers: x       Constitutional    Fever or chills:    -  PHYSICAL EXAM:   Vitals:   08/21/22 1111  BP: (!) 140/85  Pulse: 91  Resp: 18  Temp: 98.5 F (36.9 C)  TempSrc: Temporal  SpO2: 97%  Weight: (!) 349 lb 1.6 oz (158.4 kg)  Height: 6' 0.5" (1.842 m)   Body mass index is 46.7 kg/m.  GENERAL: The patient is a well-nourished male, in no acute distress. The vital signs are documented above. CARDIAC: There is a regular rate and rhythm.  VASCULAR: I do not detect carotid bruits. On the right side he has a palpable dorsalis pedis and posterior tibial pulse. On the left side he has a palpable posterior tibial pulse.  He has a biphasic dorsalis pedis signal with the Doppler. He has hyperpigmentation bilaterally. He has enlarged varicose veins of both lower extremities which are more prominent on the left side.  He has a small ulcer on the lateral malleolus as documented in the photograph below.  PULMONARY: There is good air exchange bilaterally without wheezing or rales. ABDOMEN: Soft and non-tender with normal pitched bowel sounds.  MUSCULOSKELETAL: There are no major deformities. NEUROLOGIC: No focal weakness or paresthesias are detected. SKIN: There are no ulcers or rashes noted. PSYCHIATRIC: The patient has a normal affect.  DATA:    VENOUS DUPLEX: I have reviewed his venous duplex  scan that was done yesterday.  This was of the left lower extremity only.  There was no evidence of DVT.  There was deep venous reflux in the common femoral vein.  The left great saphenous vein has been previously ablated.  The anterior accessory saphenous vein had been ablated.  There was a short segment of reflux in the small saphenous vein on the left however the vein was not dilated.  If the varicose veins in the medial thigh appear to be originating off of a branch of the saphenofemoral junction.  The results of this study are summarized on the diagram below.    Deitra Mayo [facility]

## 2022-10-06 NOTE — Progress Notes (Unsigned)
HPI male never smoker followed for OSA, bronchitis, complicated by allergic rhinitis/ asthma( Dr Orvil Feil), morbid obesity, HBP,  NPSG 10/21/97-AHI 40/hour, desaturation to 80%, body weight 320 pounds -------------------------------------------------------------------------------------------   07/08/2018- 62 year old male never smoker followed for OSA, bronchitis, complicated by allergic rhinitis( Dr Orvil Feil), morbid obesity CPAP 11/Advanced -----OSA; DME [facility]. Pt wears CPAP nightly and DL attached. Pt will need new supplies.  Weight today 346 pounds Breo 100, Ventolin HFA, Flonase, His asthmatic bronchitis is managed by his allergy office.  He denies recent problems but does ask my opinion about room air filters and I advised looking for HEPA designation. He is very satisfied with his CPAP, continuing to sleep well.  Download confirms with compliance 100%, AHI 0.5/hour.  He needs replacement supplies.  Machine is about 64 years old.  10/08/22- 62 year old male never smoker followed for OSA, bronchitis, complicated by allergic rhinitis( Dr Orvil Feil), morbid obesity -Breo 100, Ventolin HFA, Flonase, CPAP 11/Advanced  replacement ordered 3/27/23Volanda Napoleon, NP Download compliance- Body weight today Covid vax- Flu vax- His asthmatic bronchitis is managed by his allergy office.     ROS-see HPI + = positive Constitutional:   No-  unexplained weight loss, night sweats, fevers, chills, fatigue, lassitude. HEENT:   No-  headaches, difficulty swallowing, tooth/dental problems, sore throat,       No-  sneezing, itching, ear ache, nasal congestion, post nasal drip,  CV:  No-   chest pain, orthopnea, PND, swelling in lower extremities, anasarca, dizziness, palpitations Resp: No-  acute shortness of breath with exertion or at rest.              No-   productive cough,  + non-productive cough,  No- coughing up of blood.              No-   change in color of mucus.  No- wheezing.   Skin: No-   rash or  lesions. GI:  No-   heartburn, indigestion, abdominal pain, nausea, vomiting, GU:  MS:  No-   joint pain or swelling.  Neuro-     nothing unusual Psych:  No- change in mood or affect. No depression or anxiety.  No memory loss.  OBJ- Physical Exam General- Alert, Oriented, Affect-appropriate, Distress- none acute, morbidly obese Skin- rash-none, lesions- none, excoriation- none, + plethoric Lymphadenopathy- none Head- atraumatic            Eyes- Gross vision intact, PERRLA, conjunctivae and secretions clear, ? proptosis            Ears- Hearing, canals-normal            Nose- Clear, no-Septal dev, mucus, polyps, erosion, perforation             Throat- Mallampati III , mucosa clear , drainage- none, tonsils- atrophic Neck- flexible , trachea midline, no stridor , thyroid nl, carotid no bruit Chest - symmetrical excursion , unlabored           Heart/CV- RRR , no murmur , no gallop  , no rub, nl s1 s2                           - JVD- none , edema- none, stasis changes- none, varices- none           Lung- clear to P&A, wheeze- none, cough- none , dullness-none, rub- none           Chest wall-  Abd-  Br/ Gen/ Rectal- Not  done, not indicated Extrem- cyanosis- none, clubbing, none, atrophy- none, strength- nl Neuro- grossly intact to observation

## 2022-10-08 ENCOUNTER — Encounter: Payer: Self-pay | Admitting: Internal Medicine

## 2022-10-08 ENCOUNTER — Ambulatory Visit (INDEPENDENT_AMBULATORY_CARE_PROVIDER_SITE_OTHER): Payer: Medicaid Other | Admitting: Internal Medicine

## 2022-10-08 VITALS — BP 128/70 | HR 78 | Ht 74.0 in | Wt 351.8 lb

## 2022-10-08 DIAGNOSIS — G4733 Obstructive sleep apnea (adult) (pediatric): Secondary | ICD-10-CM

## 2022-10-08 DIAGNOSIS — I10 Essential (primary) hypertension: Secondary | ICD-10-CM | POA: Diagnosis not present

## 2022-10-08 NOTE — Patient Instructions (Signed)
We can continue CPAP 11  Please call if we can help

## 2022-10-08 NOTE — Assessment & Plan Note (Signed)
He questioned differences between home cuff measurement and medical office measurement.  I suggested he bring his home BP cuff with him to his PCP office for side-by-side comparison.

## 2022-10-08 NOTE — Assessment & Plan Note (Signed)
With good compliance and control. Plan-continue CPAP 11

## 2022-10-31 ENCOUNTER — Ambulatory Visit: Payer: 59 | Admitting: Internal Medicine

## 2022-11-05 ENCOUNTER — Ambulatory Visit: Payer: 59 | Admitting: Internal Medicine

## 2022-12-17 ENCOUNTER — Telehealth: Payer: Self-pay

## 2022-12-17 NOTE — Telephone Encounter (Signed)
Caller: Patient  Concern: stasis ulcer that had previously healed  Location: left ankle  Description:  x 6 days  Resolution: Appointment scheduled per staff msg reply from Dr. Edilia Bo for first available  Next Appt: Appointment scheduled for 12/18/22 @ 1300

## 2022-12-18 ENCOUNTER — Ambulatory Visit (INDEPENDENT_AMBULATORY_CARE_PROVIDER_SITE_OTHER): Payer: Medicaid Other | Admitting: Vascular Surgery

## 2022-12-18 ENCOUNTER — Encounter: Payer: Self-pay | Admitting: Vascular Surgery

## 2022-12-18 VITALS — BP 146/90 | HR 74 | Temp 97.6°F | Resp 16 | Ht 74.0 in | Wt 352.0 lb

## 2022-12-18 DIAGNOSIS — L97929 Non-pressure chronic ulcer of unspecified part of left lower leg with unspecified severity: Secondary | ICD-10-CM | POA: Diagnosis not present

## 2022-12-18 DIAGNOSIS — I83209 Varicose veins of unspecified lower extremity with both ulcer of unspecified site and inflammation: Secondary | ICD-10-CM | POA: Diagnosis not present

## 2022-12-18 DIAGNOSIS — L97919 Non-pressure chronic ulcer of unspecified part of right lower leg with unspecified severity: Secondary | ICD-10-CM

## 2022-12-18 DIAGNOSIS — I872 Venous insufficiency (chronic) (peripheral): Secondary | ICD-10-CM

## 2022-12-18 NOTE — Progress Notes (Signed)
REASON FOR VISIT:   Recurrent venous ulcer left medial malleolus  MEDICAL ISSUES:   CHRONIC VENOUS INSUFFICIENCY: This patient has CEAP C6 venous disease.  The wound on his medial malleolus on the left that healed but has now recurred although it is fairly small.  He will continue to elevate his legs is much as possible on his leg rest.  He is wearing knee-high compression stockings with a gradient of 20 to 30 mmHg.  He is exercising.  He is motivated to lose some weight.  Thus he is doing all the right things to facilitate healing of this wound.  If this does not heal I think the next option would be greater than 20 stab phlebectomies in the left leg.  He has previously had the great saphenous vein ablated and subsequently the anterior accessory saphenous vein ablated.  On his most recent duplex he did not have any other significant superficial venous reflux to address.  Remaining option would be a knee-high pneumatic compression device (BioTAB), if the wounds were not healing.  He will call if the wound fails to heal.  Based on my assessment he does not have any evidence of significant arterial insufficiency as it as he has a biphasic dorsalis pedis and posterior tibial signal bilaterally.   HPI:   Casey Roach is a pleasant 62 y.o. male who I saw on 08/21/2022 with C6 venous disease.  He had previous laser ablation of the left great saphenous vein and left anterior saphenous vein in 2015 and 2017 respectively.  When I saw him last, he did not have any significant reflux in the small saphenous vein.  Thus there was no further superficial disease to address except for the varicosities in his left leg.  Since I saw him last the wound had healed with just a few days ago he developed a recurrent small ulceration in his medial malleolus on the left leg with a small amount of drainage.  He has been elevating his legs daily with his leg rest and wearing his knee-high compression stockings with a  gradient of 20 to 30 mmHg.  He is not a smoker.  He denies any history of claudication or rest pain.  Past Medical History:  Diagnosis Date   Acute bronchitis    Allergy    Morbid obesity (HCC) 06/02/2008   Qualifier: Diagnosis of  By: Renaldo Fiddler CMA, Leigh     Obesity, unspecified    Obstructive sleep apnea (adult) (pediatric)    Sleep apnea    Tubular adenoma of colon 11/2008   Unspecified essential hypertension     Family History  Problem Relation Age of Onset   Diabetes Mother    Pancreatic cancer Maternal Grandmother    Colon polyps Neg Hx    Esophageal cancer Neg Hx    Rectal cancer Neg Hx    Stomach cancer Neg Hx    Colon cancer Neg Hx     SOCIAL HISTORY: Social History   Tobacco Use   Smoking status: Never   Smokeless tobacco: Never  Substance Use Topics   Alcohol use: Yes    Comment: rarely    Allergies  Allergen Reactions   Other Other (See Comments) and Shortness Of Breath    Current Outpatient Medications  Medication Sig Dispense Refill   colchicine 0.6 MG tablet PRN     dicyclomine (BENTYL) 20 MG tablet Take 1 tablet (20 mg total) by mouth 2 (two) times daily. 20 tablet 0  EPINEPHrine 0.3 mg/0.3 mL IJ SOAJ injection PRN     famotidine (PEPCID) 40 MG tablet Take 1 tablet (40 mg total) by mouth at bedtime. 20 tablet 0   fluticasone (FLONASE) 50 MCG/ACT nasal spray Place 2 sprays into the nose daily as needed for allergies or rhinitis.     Fluticasone Furoate-Vilanterol (BREO ELLIPTA) 100-25 MCG/INH AEPB Inhale 1 puff into the lungs daily. Rinse mouth 60 each 11   Ibuprofen 200 MG CAPS Take 400 mg by mouth once as needed (pain.).     lisinopril-hydrochlorothiazide (PRINZIDE,ZESTORETIC) 20-12.5 MG per tablet Take 1 tablet by mouth every morning.      minocycline (MINOCIN) 100 MG capsule PRN     Naproxen Sodium (ALEVE PO) Take 2 capsules by mouth once as needed (pain).     NON FORMULARY Allergy Vaccine -Meg Whelan/twice a month per pt     NON FORMULARY CPAP  11     ondansetron (ZOFRAN-ODT) 8 MG disintegrating tablet Take 1 tablet (8 mg total) by mouth every 8 (eight) hours as needed for nausea. 30 tablet 0   pantoprazole (PROTONIX) 40 MG tablet Take 1 tablet (40 mg total) by mouth daily. Please schedule an appt for any further refills. 30 tablet 0   VENTOLIN HFA 108 (90 Base) MCG/ACT inhaler USE 2 PUFFS EVERY 4 TO 6 HOURS AS NEEDED FOR COUGH/WHEEZING. 18 g 0   No current facility-administered medications for this visit.    REVIEW OF SYSTEMS:  [X]  denotes positive finding, [ ]  denotes negative finding Cardiac  Comments:  Chest pain or chest pressure:    Shortness of breath upon exertion:    Short of breath when lying flat:    Irregular heart rhythm:        Vascular    Pain in calf, thigh, or hip brought on by ambulation:    Pain in feet at night that wakes you up from your sleep:     Blood clot in your veins:    Leg swelling:  x       Pulmonary    Oxygen at home:    Productive cough:     Wheezing:         Neurologic    Sudden weakness in arms or legs:     Sudden numbness in arms or legs:     Sudden onset of difficulty speaking or slurred speech:    Temporary loss of vision in one eye:     Problems with dizziness:         Gastrointestinal    Blood in stool:     Vomited blood:         Genitourinary    Burning when urinating:     Blood in urine:        Psychiatric    Major depression:         Hematologic    Bleeding problems:    Problems with blood clotting too easily:        Skin    Rashes or ulcers:        Constitutional    Fever or chills:     PHYSICAL EXAM:   Vitals:   12/18/22 1259  Resp: 16  Weight: (!) 352 lb (159.7 kg)  Height: 6\' 2"  (1.88 m)    GENERAL: The patient is a well-nourished male, in no acute distress. The vital signs are documented above. CARDIAC: There is a regular rate and rhythm.  VASCULAR: I do not detect carotid bruits. He has  a palpable pedal pulse on the right.  I cannot palpate a  pedal pulses on the left however he has a biphasic dorsalis pedis and posterior tibial signal bilaterally. He has large varicose veins in his medial left thigh and left calf as documented in the photographs below.  PULMONARY: There is good air exchange bilaterally without wheezing or rales. ABDOMEN: Soft and non-tender with normal pitched bowel sounds.  MUSCULOSKELETAL: There are no major deformities or cyanosis. NEUROLOGIC: No focal weakness or paresthesias are detected. SKIN: He has a small wound adjacent to his left medial malleolus which measures less than a centimeter in diameter.  PSYCHIATRIC: The patient has a normal affect.  DATA:    No new studies.  Waverly Ferrari [facility] [phone]

## 2023-01-07 DIAGNOSIS — I8393 Asymptomatic varicose veins of bilateral lower extremities: Secondary | ICD-10-CM

## 2023-01-29 LAB — COMPLETE METABOLIC PANEL WITH GFR: EGFR: 65

## 2023-03-20 LAB — HEMOGLOBIN A1C: A1c: 6.1

## 2023-03-20 LAB — COMPLETE METABOLIC PANEL WITH GFR: EGFR: 97.3

## 2023-03-20 NOTE — Progress Notes (Unsigned)
Office Visit Note  Patient: Casey Roach             Date of Birth: [birth year]           MRN: [id number]             PCP: Geoffry Paradise, MD Referring: Jacinta Shoe, PA-C Visit Date: 04/03/2023 Occupation: @GUAROCC @  Subjective:  Recurrent gout flares   History of Present Illness: Casey Roach is a 62 y.o. male who presents today for a new patient consultation.  Patient states that he was initially diagnosed with gout 4 years ago.  He states that at that time he was having infrequent flares and would take colchicine as needed which alleviated his symptoms.  Patient states that he has been a patient at [facility] and states that Dr. Devonne Doughty previously aspirated his left knee which revealed crystal proven gout.  He states that starting in May he started to have more frequent gout flares.  He states his urologist checked his uric acid on 12/10/2022 at which time his uric acid was 9.3.  He was started on allopurinol 300 mg daily by his urologist.  Patient states that he has had more frequent flares since initiating allopurinol.  He states that he tried taking colchicine twice daily but developed diarrhea and had to resume once daily.  He states that colchicine no longer was managing his symptoms so he had to try taking a prednisone taper.  Patient states that he continued to have recurrent flares and was switched to indomethacin 75 mg twice daily during flares.  He has since reduced indomethacin to 75 mg 1 caspule daily.  Patient continues to have pain and inflammation involving the right foot especially his right fifth toe.  This morning his pain was a 9 out of 10 prior to taking a dose of indomethacin. Patient states that he does not eat red meat and does not drink any alcohol.  He has been watching his sugar intake as well.  Patient states that in the past he has an occasional crab cake but has been trying to follow a low purine diet.     Activities of Daily Living:  Patient  reports morning stiffness for several hours.   Patient Reports nocturnal pain.  Difficulty dressing/grooming: Denies Difficulty climbing stairs: Reports Difficulty getting out of chair: Reports Difficulty using hands for taps, buttons, cutlery, and/or writing: Denies  Review of Systems  Constitutional:  Negative for fatigue.  HENT:  Negative for mouth sores and mouth dryness.   Eyes:  Negative for dryness.  Respiratory:  Negative for shortness of breath.   Cardiovascular:  Negative for chest pain and palpitations.  Gastrointestinal:  Negative for blood in stool, constipation and diarrhea.  Endocrine: Negative for increased urination.  Genitourinary:  Negative for involuntary urination.  Musculoskeletal:  Positive for joint pain, gait problem, joint pain, joint swelling and morning stiffness. Negative for myalgias, muscle weakness, muscle tenderness and myalgias.  Skin:  Negative for color change, rash, hair loss and sensitivity to sunlight.  Allergic/Immunologic: Positive for susceptible to infections.  Neurological:  Negative for dizziness and headaches.  Hematological:  Negative for swollen glands.  Psychiatric/Behavioral:  Positive for sleep disturbance. Negative for depressed mood. The patient is not nervous/anxious.     PMFS History:  Patient Active Problem List   Diagnosis Date Noted  . Osteoarthritis 07/10/2018  . Hepatic steatosis 09/15/2017  . Pain in joint of right hip 08/25/2017  . Varicose veins of left  lower extremity with complications 02/20/2016  . Varicose veins of bilateral lower extremities with other complications 01/16/2016  . Insomnia 12/03/2015  . History of colonic polyps 05/31/2014  . Benign neoplasm of colon 05/31/2014  . Varicose veins without complication 05/23/2014  . Benign nodular prostatic hyperplasia with lower urinary tract symptoms 05/04/2014  . ED (erectile dysfunction) of organic origin 05/04/2014  . Hypogonadism in male 05/04/2014  .  Varicose veins of lower extremities with other complications 04/18/2014  . Varicose veins of lower extremities with ulcer and inflammation (HCC) 02/08/2014  . Morbid obesity (HCC) 07/21/2013  . HTN (hypertension) 07/21/2013  . Dyspnea 07/21/2013  . ACUTE BRONCHITIS 06/29/2010  . Obstructive sleep apnea 06/02/2008  . HYPERTENSION 06/02/2008    Past Medical History:  Diagnosis Date  . Acute bronchitis   . Allergy   . Morbid obesity (HCC) 06/02/2008   Qualifier: Diagnosis of  By: Renaldo Fiddler CMA, Leigh    . Obesity, unspecified   . Obstructive sleep apnea (adult) (pediatric)   . Sleep apnea   . Tubular adenoma of colon 11/2008  . Unspecified essential hypertension     Family History  Problem Relation Age of Onset  . Diabetes Mother        borderline  . Pancreatic cancer Maternal Grandmother   . Colon polyps Neg Hx   . Esophageal cancer Neg Hx   . Rectal cancer Neg Hx   . Stomach cancer Neg Hx   . Colon cancer Neg Hx    Past Surgical History:  Procedure Laterality Date  . COLONOSCOPY WITH PROPOFOL N/A 05/31/2014   Procedure: COLONOSCOPY WITH PROPOFOL;  Surgeon: Louis Meckel, MD;  Location: [facility];  Service: Endoscopy;  Laterality: N/A;  . LASER ABLATION  03/28/2014, 05/16/2014   left leg, right leg  . VARICOCELE EXCISION     Social History   Social History Narrative  . Not on file   Immunization History  Administered Date(s) Administered  . Influenza Split 05/06/2011, 05/05/2012, 05/05/2013, 05/05/2014, 04/05/2017, 05/09/2018  . Influenza, High Dose Seasonal PF 06/25/2016, 06/24/2017, 07/06/2018, 07/08/2019, 07/10/2020  . Influenza,inj,Quad PF,6+ Mos 05/06/2015  . Influenza,inj,quad, With Preservative 04/07/2017  . Influenza-Unspecified 05/05/2022  . PFIZER(Purple Top)SARS-COV-2 Vaccination 10/28/2019, 11/22/2019, 06/18/2020, 07/10/2020     Objective: Vital Signs: BP 130/84 (BP Location: Right Arm, Patient Position: Sitting, Cuff Size: Large)   Pulse 61    Resp 17   Ht 6\' 1"  (1.854 m)   Wt (!) 335 lb 6.4 oz (152.1 kg)   BMI 44.25 kg/m    Physical Exam Vitals and nursing note reviewed.  Constitutional:      Appearance: He is well-developed.  HENT:     Head: Normocephalic and atraumatic.  Eyes:     Conjunctiva/sclera: Conjunctivae normal.     Pupils: Pupils are equal, round, and reactive to light.  Cardiovascular:     Rate and Rhythm: Normal rate and regular rhythm.     Heart sounds: Normal heart sounds.  Pulmonary:     Effort: Pulmonary effort is normal.     Breath sounds: Normal breath sounds.  Abdominal:     General: Bowel sounds are normal.     Palpations: Abdomen is soft.  Musculoskeletal:     Cervical back: Normal range of motion and neck supple.  Skin:    General: Skin is warm and dry.     Capillary Refill: Capillary refill takes less than 2 seconds.  Neurological:     Mental Status: He is alert and oriented to  person, place, and time.  Psychiatric:        Behavior: Behavior normal.     Musculoskeletal Exam: C-spine has good ROM.  Limited mobility of lumbar spine.  Right shoulder has good ROM.  Left shoulder has slightly limited ROM.  Elbow joints, wrist joints, MCPs, PIPs, and DIPs good ROM with no synovitis.  PIP and DIP thickening consistent with osteoarthritis of both hands.  Hip joints have good ROM with no groin pain.  Right knee has good ROM with no warmth or effusion.  Left knee has discomfort with ROM-warmth and mild swelling noted.  Pedal edema in bilateral LE noted. Tenderness and erythema of the right 5th toe.  Post-inflammatory skin peeling noted overlying the left great toe.   CDAI Exam: CDAI Score: -- Patient Global: --; Provider Global: -- Swollen: --; Tender: -- Joint Exam 04/03/2023   No joint exam has been documented for this visit   There is currently no information documented on the homunculus. Go to the Rheumatology activity and complete the homunculus joint exam.  Investigation: No additional  findings.  Imaging: XR Foot 2 Views Left  Result Date: 04/03/2023 Second MTP narrowing was noted.  PIP and DIP narrowing was noted.  Erosions were noted over second and fourth metatarsal heads.  Erosion was noted at the base of the second proximal phalanx.  Intertarsal narrowing with dorsal spurring was noted.  Possible erosion was in the navicular.  No tibiotalar or subtalar narrowing was noted.  Inferior calcaneal spur was noted. Impression: These findings are suggestive of osteoarthritis and erosive inflammatory arthritis most likely gouty arthropathy.  XR Foot 2 Views Right  Result Date: 04/03/2023 No MTP narrowing was noted.  Possible erosion was noted over the fifth metatarsal head.  PIP and DIP narrowing was noted.  No intertarsal, tibiotalar or subtalar joint space narrowing was noted.  Inferior and posterior calcaneal spurs were noted. Impression: These findings were suggestive of osteoarthritis and inflammatory arthritis overlap.  XR KNEE 3 VIEW LEFT  Result Date: 04/03/2023 No medial or lateral compartment narrowing was noted.  Mild patellofemoral narrowing was noted.  No chondrocalcinosis was noted. Impression: These findings are suggestive of mild chondromalacia patella.   Recent Labs: Lab Results  Component Value Date   WBC 8.0 07/14/2018   HGB 16.6 07/14/2018   PLT 212.0 07/14/2018   NA 136 07/14/2018   K 4.1 07/14/2018   CL 100 07/14/2018   CO2 29 07/14/2018   GLUCOSE 104 (H) 07/14/2018   BUN 16 07/14/2018   CREATININE 0.99 07/14/2018   BILITOT 0.8 07/14/2018   ALKPHOS 61 07/14/2018   AST 23 07/14/2018   ALT 26 07/14/2018   PROT 7.7 07/14/2018   ALBUMIN 4.3 07/14/2018   CALCIUM 10.2 07/14/2018   GFRAA  10/27/2010    >60        The eGFR has been calculated using the MDRD equation. This calculation has not been validated in all clinical situations. eGFR's persistently <60 mL/min signify possible Chronic Kidney Disease.    Speciality Comments: No specialty  comments available.  Procedures:  No procedures performed Allergies: Other    Assessment / Plan:     Visit Diagnoses: Idiopathic chronic gout of multiple sites without tophus -Crystal proven gout-left knee aspiration performed by Dr. Ranell Patrick.  Dx with gout 4 years ago.  Initially had infrequent flares which would resolve with the use of colchicine taken PRN.  Uric acid was 9.3 on 12/10/22--leading to his urologist initiating Allopurinol 300 mg 1 tablet  by mouth daily.  After initiating allopurinol he started to have more frequent flares.  He was having to take colchicine more frequently and even increased to twice daily dosing with minimal improvement in his symptoms.  If he takes colchicine twice daily he experiences diarrhea.  He has tried taking prednisone but has found the Methasone to be most effective at managing his symptoms.  He is currently prescribed indomethacin 75 mg 1 to 2 tablets daily by his orthopedist.  During flares he takes indomethacin twice daily but otherwise tries to take indomethacin once daily.  He has been tolerating allopurinol and indomethacin without any side effects but continues to have recurrent flares.  He is currently having pain and inflammation involving the right fifth toe.  He also has some warmth and discomfort in his left knee joint.  Plan to check uric acid, sed rate, and CRP today.  I will also be checking CBC and CMP today and will obtain recent labs drawn by PCP.  Different treatment options were discussed today in detail.  Information about allopurinol was reviewed.  The patient would like to try increasing the dose of allopurinol as discussed pending lab results--allopurinol will be increased to 450 mg daily.  He would like to remain on indomethacin 75 mg 1 to 2 tablets daily since he has found it to be more effective than taking colchicine.  Discussed that if his uric acid level does not reach the desirable range with the use of allopurinol I would recommend  switching to Uloric in the future.  Patient was given an informational handout about Uloric to review.  Patient is apprehensive of possible side effects with Uloric and would like to hold off on making the switch at this time. X-rays of the left knee and both feet were updated today along with the following lab work.  He will be notified of these results once completed.  He will return for a new patient follow-up visit at which time we can review x-rays as well as assess his response to the increased dose of allopurinol.- Plan: Sedimentation rate, C-reactive protein, Uric acid  Medication monitoring encounter - Plan to obtain CBC, CMP, and uric acid today.  We will call to obtain recent labs ordered by PCP to review.  Prescriptions for indomethacin and allopurinol will be sent to the pharmacy pending lab results.   Plan: CBC with Differential/Platelet, COMPLETE METABOLIC PANEL WITH GFR  Pain in both feet -patient presents today experiencing pain and inflammation involving the right foot.  He has tenderness, swelling, and erythema over the right fifth MTP joint.  Postinflammatory skin peeling was noted overlying the left great toe.  X-rays of both feet updated today.  Findings were consistent with osteoarthritis and inflammatory arthritis overlap.  Erosive changes noted.  Patient has been notified of the x-ray results.  Plan: XR Foot 2 Views Right, XR Foot 2 Views Left  Chronic pain of left knee - He presents today with increased discomfort in the left knee joint.  Previously had a left knee joint aspiration positive for urate crystals performed by Dr. Ranell Patrick.  On examination today he has warmth and swelling in the left knee.  X-rays of the left knee updated today.  Plan: XR KNEE 3 VIEW LEFT  Primary osteoarthritis of left shoulder: Under care of Dr. Ranell Patrick.   Trochanteric bursitis of right hip: Not currently symptomatic.  Good ROM of the right hip.   Primary hypertension: BP was 130/84 today in the  office.  Discussed possibly switching from Olmesartan to Losartan--losartan has been found to lower uric acid levels to a higher extent than olmesartan.   Other medical conditions are listed as follows:   Varicose veins of lower extremities with ulcer and inflammation (HCC)  Obstructive sleep apnea  Hepatic steatosis  Hypogonadism in male  Benign nodular prostatic hyperplasia with lower urinary tract symptoms  History of colonic polyps  ED (erectile dysfunction) of organic origin   Orders: Orders Placed This Encounter  Procedures  . XR Foot 2 Views Right  . XR Foot 2 Views Left  . XR KNEE 3 VIEW LEFT  . CBC with Differential/Platelet  . COMPLETE METABOLIC PANEL WITH GFR  . Sedimentation rate  . C-reactive protein  . Uric acid   No orders of the defined types were placed in this encounter.    Follow-Up Instructions: Return for NPFU, Osteoarthritis, Gout.   Gearldine Bienenstock, PA-C  Note - This record has been created using Dragon software.  Chart creation errors have been sought, but may not always  have been located. Such creation errors do not reflect on  the standard of medical care.

## 2023-04-03 ENCOUNTER — Ambulatory Visit (INDEPENDENT_AMBULATORY_CARE_PROVIDER_SITE_OTHER): Payer: Medicaid Other

## 2023-04-03 ENCOUNTER — Ambulatory Visit: Payer: Medicaid Other

## 2023-04-03 ENCOUNTER — Encounter: Payer: Self-pay | Admitting: Physician Assistant

## 2023-04-03 ENCOUNTER — Ambulatory Visit: Payer: Medicaid Other | Attending: Physician Assistant | Admitting: Physician Assistant

## 2023-04-03 VITALS — BP 130/84 | HR 61 | Resp 17 | Ht 73.0 in | Wt 335.4 lb

## 2023-04-03 DIAGNOSIS — M79671 Pain in right foot: Secondary | ICD-10-CM

## 2023-04-03 DIAGNOSIS — K760 Fatty (change of) liver, not elsewhere classified: Secondary | ICD-10-CM

## 2023-04-03 DIAGNOSIS — G8929 Other chronic pain: Secondary | ICD-10-CM

## 2023-04-03 DIAGNOSIS — I83229 Varicose veins of left lower extremity with both ulcer of unspecified site and inflammation: Secondary | ICD-10-CM | POA: Diagnosis not present

## 2023-04-03 DIAGNOSIS — M1A09X0 Idiopathic chronic gout, multiple sites, without tophus (tophi): Secondary | ICD-10-CM | POA: Diagnosis not present

## 2023-04-03 DIAGNOSIS — G4733 Obstructive sleep apnea (adult) (pediatric): Secondary | ICD-10-CM

## 2023-04-03 DIAGNOSIS — I83219 Varicose veins of right lower extremity with both ulcer of unspecified site and inflammation: Secondary | ICD-10-CM

## 2023-04-03 DIAGNOSIS — M79672 Pain in left foot: Secondary | ICD-10-CM

## 2023-04-03 DIAGNOSIS — E291 Testicular hypofunction: Secondary | ICD-10-CM

## 2023-04-03 DIAGNOSIS — N529 Male erectile dysfunction, unspecified: Secondary | ICD-10-CM

## 2023-04-03 DIAGNOSIS — M19012 Primary osteoarthritis, left shoulder: Secondary | ICD-10-CM

## 2023-04-03 DIAGNOSIS — M25562 Pain in left knee: Secondary | ICD-10-CM

## 2023-04-03 DIAGNOSIS — M7061 Trochanteric bursitis, right hip: Secondary | ICD-10-CM | POA: Diagnosis not present

## 2023-04-03 DIAGNOSIS — Z86010 Personal history of colonic polyps: Secondary | ICD-10-CM

## 2023-04-03 DIAGNOSIS — N401 Enlarged prostate with lower urinary tract symptoms: Secondary | ICD-10-CM

## 2023-04-03 DIAGNOSIS — L97919 Non-pressure chronic ulcer of unspecified part of right lower leg with unspecified severity: Secondary | ICD-10-CM

## 2023-04-03 DIAGNOSIS — I10 Essential (primary) hypertension: Secondary | ICD-10-CM

## 2023-04-03 DIAGNOSIS — L97929 Non-pressure chronic ulcer of unspecified part of left lower leg with unspecified severity: Secondary | ICD-10-CM

## 2023-04-03 DIAGNOSIS — Z5181 Encounter for therapeutic drug level monitoring: Secondary | ICD-10-CM

## 2023-04-03 NOTE — Progress Notes (Signed)
X-rays of the left knee were consistent with mild chondromalacia patella. Please notify the patient.

## 2023-04-03 NOTE — Patient Instructions (Addendum)
Information for patients with Gout  Gout defined-Gout occurs when urate crystals accumulate in your joint causing the inflammation and intense pain of gout attack.  Urate crystals can form when you have high levels of uric acid in your blood.  Your body produces uric acid when it breaks down prurines-substances that are found naturally in your body, as well as in certain foods such as organ meats, anchioves, herring, asparagus, and mushrooms.  Normally uric acid dissolves in your blood and passes through your kidneys into your urine.  But sometimes your body either produces too much uric acid or your kidneys excrete too little uric acid.  When this happens, uric acid can build up, forming sharp needle-like urate crystals in a joint or surrounding tissue that cause pain, inflammation and swelling.    Gout is characterized by sudden, severe attacks of pain, redness and tenderness in joints, often the joint at the base of the big toe.  Gout is complex form of arthritis that can affect anyone.  Men are more likely to get gout but women become increasingly more susceptible to gout after menopause.  An acute attack of gout can wake you up in the middle of the night with the sensation that your big toe is on fire.  The affected joint is hot, swollen and so tender that even the weight or the sheet on it may seem intolerable.  If you experience symptoms of an acute gout attack it is important to your doctor as soon as the symptoms start.  Gout that goes untreated can lead to worsening pain and joint damage.  Risk Factors:  You are more likely to develop gout if you have high levels of uric acid in your body.    Factors that increase the uric acid level in your body include:  Lifestyle factors.  Excessive alcohol use-generally more than two drinks a day for men and more than one for women increase the risk of gout.  Medical conditions.  Certain conditions make it more likely that you will develop gout.   These include hypertension, and chronic conditions such as diabetes, high levels of fat and cholesterol in the blood, and narrowing of the arteries.  Certain medications.  The uses of Thiazide diuretics- commonly used to treat hypertension and low dose aspirin can also increase uric acid levels.  Family history of gout.  If other members of your family have had gout, you are more likely to develop the disease.  Age and sex. Gout occurs more often in men than it does in women, primarily because women tend to have lower uric acid levels than men do.  Men are more likely to develop gout earlier usually between the ages of 17-50- whereas women generally develop signs and symptoms after menopause.    Tests and diagnosis:  Tests to help diagnose gout may include:  Blood test.  Your doctor may recommend a blood test to measure the uric acid level in your blood .  Blood tests can be misleading, though.  Some people have high uric acid levels but never experience gout.  And some people have signs and symptoms of gout, but don't have unusual levels of uric acid in their blood.  Joint fluid test.  Your doctor may use a needle to draw fluid from your affected joint.  When examined under the microscope, your joint fluid may reveal urate crystals.  Treatment:  Treatment for gout usually involves medications.  What medications you and your doctor choose will be  based on your current health and other medications you currently take.  Gout medications can be used to treat acute gout attacks and prevent future attacks as well as reduce your risk of complications from gout such as the development of tophi from urate crystal deposits.  Alternative medicine:   Certain foods have been studied for their potential to lower uric acid levels, including:  Coffee.  Studies have found an association between coffee drinking (regular and decaf) and lower uric acid levels.  The evidence is not enough to encourage  non-coffee drinkers to start, but it may give clues to new ways of treating gout in the future.  Vitamin C.  Supplements containing vitamin C may reduce the levels of uric acid in your blood.  However, vitamin as a treatment for gout. Don't assume that if a little vitamin C is good, than lots is better.  Megadoses of vitamin C may increase your bodies uric acid levels.  Cherries.  Cherries have been associated with lower levels of uric acid in studies, but it isn't clear if they have any effect on gout signs and symptoms.  Eating more cherries and other dar-colored fruits, such as blackberries, blueberries, purple grapes and raspberries, may be a safe way to support your gout treatment.    Lifestyle/Diet Recommendations:  Drink 8 to 16 cups ( about 2 to 4 liters) of fluid each day, with at least half being water. Avoid alcohol Eat a moderate amount of protein, preferably from healthy sources, such as low-fat or fat-free dairy, tofu, eggs, and nut butters. Limit you daily intake of meat, fish, and poultry to 4 to 6 ounces. Avoid high fat meats and desserts. Decrease you intake of shellfish, beef, lamb, pork, eggs and cheese. Choose a good source of vitamin C daily such as citrus fruits, strawberries, broccoli,  brussel sprouts, papaya, and cantaloupe.  Choose a good source of vitamin A every other day such as yellow fruits, or dark green/yellow vegetables. Avoid drastic weigh reduction or fasting.  If weigh loss is desired lose it over a period of several months. See "dietary considerations.." chart for specific food recommendations.  Dietary Considerations for people with Gout  Food with negligible purine content (0-15 mg of purine nitrogen per 100 grams food)  May use as desired except on calorie variations  Non fat milk Cocoa Cereals (except in list II) Hard candies  Buttermilk Carbonated drinks Vegetables (except in list II) Sherbet  Coffee Fruits Sugar Honey  Tea Cottage Cheese  Gelatin-jell-o Salt  Fruit juice Breads Angel food Cake   Herbs/spices Jams/Jellies [employer]    Foods that do not contain excessive purine content, but must be limited due to fat content  Cream Eggs Oil and Salad Dressing  Half and Half Peanut Butter Chocolate  Whole Milk Cakes Potato Chips  Butter Ice Cream Fried Foods  Cheese Nuts Waffles, pancakes   List II: Food with moderate purine content (50-150 mg of purine nitrogen per 100 grams of food)  Limit total amount each day to 5 oz. cooked Lean meat, other than those on list III   Poultry, other than those on list III Fish, other than those on list III   Seafood, other than those on list III  These foods may be used occasionally  Peas Lentils Bran  Spinach Oatmeal Dried Beans and Peas  Asparagus Wheat Germ Mushrooms   Additional information about meat choices  Choose fish and poultry, particularly without skin, often.  Select lean, well  trimmed cuts of meat.  Avoid all fatty meats, bacon , sausage, fried meats, fried fish, or poultry, luncheon meats, cold cuts, hot dogs, meats canned or frozen in gravy, spareribs and frozen and packaged prepared meats.   List III: Foods with HIGH purine content / Foods to AVOID (150-800 mg of purine nitrogen per 100 grams of food)  Anchovies Herring Meat Broths  Liver Mackerel Meat Extracts  Kidney Scallops Meat Drippings  Sardines Wild Game Mincemeat  Sweetbreads Goose Gravy  Heart Tongue Yeast, baker's and brewers   Commercial soups made with any of the foods listed in List II or List III  In addition avoid all alcoholic beverages  Febuxostat Tablets What is this medication? FEBUXOSTAT (feb UX oh stat) prevents gout attacks. It is prescribed when other medications have not worked or cannot be tolerated. It works by decreasing uric acid levels in your body. This medicine may be used for other purposes; ask your health care provider or pharmacist if you have  questions. COMMON BRAND NAME(S): Uloric What should I tell my care team before I take this medication? They need to know if you have any of these conditions: Heart disease History of heart attack or stroke Kidney disease Liver disease Taking azathioprine or mercaptopurine An unusual or allergic reaction to febuxostat, other medications, foods, dyes, or preservatives Pregnant or trying to get pregnant Breast-feeding How should I use this medication? Take this medication by mouth with water. Take it as directed on the prescription label at the same time every day. You can take it with or without food. If it upsets your stomach, take it with food. Keep taking it unless your care team tells you to stop. A special MedGuide will be given to you by the pharmacist with each prescription and refill. Be sure to read this information carefully each time. Talk to your care team about the use of this medication in children. Special care may be needed. Overdosage: If you think you have taken too much of this medicine contact a poison control center or emergency room at once. NOTE: This medicine is only for you. Do not share this medicine with others. What if I miss a dose? If you miss a dose, take it as soon as you can. If it is almost time for your next dose, take only that dose. Do not take double or extra doses. What may interact with this medication? Do not take this medication with any of the following: Azathioprine Mercaptopurine This medication may also interact with the following: Aminophylline Certain medications for cancer Pegloticase Rasburicase Theophylline This list may not describe all possible interactions. Give your health care provider a list of all the medicines, herbs, non-prescription drugs, or dietary supplements you use. Also tell them if you smoke, drink alcohol, or use illegal drugs. Some items may interact with your medicine. What should I watch for while using this  medication? Visit your care team for regular checks on your progress. Tell your care team if your symptoms do not start to get better or if they get worse. You will need regular blood tests while you are taking this medication. Your gout may flare up when you are first taking this medication. Do not stop taking this medication, even if you have a flare. Your care team may give you another medication to help treat the gout pain. This medication may cause serious skin reactions. They can happen weeks to months after starting the medication. Contact your care team right away if  you notice fevers or flu-like symptoms with a rash. The rash may be red or purple and then turn into blisters or peeling of the skin. You may also notice a red rash with swelling of the face, lips, or lymph nodes in your neck or under your arms. What side effects may I notice from receiving this medication? Side effects that you should report to your care team as soon as possible: Allergic reactions--skin rash, itching, hives, swelling of the face, lips, tongue, or throat Heart attack--pain or tightness in the chest, shoulders, arms, or jaw, nausea, shortness of breath, cold or clammy skin, feeling faint or lightheaded Liver injury--right upper belly pain, loss of appetite, nausea, light-colored stool, dark yellow or brown urine, yellowing skin or eyes, unusual weakness or fatigue Rash, fever, and swollen lymph nodes Redness, blistering, peeling, or loosening of the skin, including inside the mouth Stroke--sudden numbness or weakness of the face, arm, or leg, trouble speaking, confusion, trouble walking, loss of balance or coordination, dizziness, severe headache, change in vision Side effects that usually do not require medical attention (report to your care team if they continue or are bothersome): Joint pain Nausea Skin rash This list may not describe all possible side effects. Call your doctor for medical advice about side  effects. You may report side effects to FDA at 1-800-FDA-1088. Where should I keep my medication? Keep out of the reach of children and pets. Store at room temperature between 20 and 25 degrees C (68 and 77 degrees F). Protect from light and moisture. Keep the container tightly closed. Get rid of any unused medication after the expiration date. To get rid of medications that are no longer needed or have expired: Take the medication to a medication take-back program. Check with your pharmacy or law enforcement to find a location. If you cannot return the medication, check the label or package insert to see if the medication should be thrown out in the garbage or flushed down the toilet. If you are not sure, ask your care team. If it is safe to put it in the trash, take the medication out of the container. Mix the medication with cat litter, dirt, coffee grounds, or other unwanted substance. Seal the mixture in a bag or container. Put it in the trash. NOTE: This sheet is a summary. It may not cover all possible information. If you have questions about this medicine, talk to your doctor, pharmacist, or health care provider.  2024 Elsevier/Gold Standard (2021-10-24 00:00:00)

## 2023-04-03 NOTE — Progress Notes (Signed)
Pharmacy Note   Subjective:  Patient presents today to the [facility] for follow up office visit.  Patient current on allopurinol 300mg  once daily for gout.  Patient was seen by the pharmacist for counseling on allopurinol and discuss Uloric (for possible med change in future). He takes indomethacin. Reports colchicine did not work well for him as well as indomethacin.   Objective: CBC    Component Value Date/Time   WBC 8.0 07/14/2018 1208   RBC 5.29 07/14/2018 1208   HGB 16.6 07/14/2018 1208   HGB 18.4 (H) 08/14/2016 1013   HCT 48.5 07/14/2018 1208   HCT 52.1 (H) 08/14/2016 1013   PLT 212.0 07/14/2018 1208   PLT 162 08/14/2016 1013   MCV 91.6 07/14/2018 1208   MCV 90.3 08/14/2016 1013   MCH 31.9 08/14/2016 1013   MCHC 34.3 07/14/2018 1208   RDW 13.6 07/14/2018 1208   RDW 13.8 08/14/2016 1013   LYMPHSABS 1.3 07/14/2018 1208   LYMPHSABS 1.4 08/14/2016 1013   MONOABS 0.9 07/14/2018 1208   MONOABS 0.6 08/14/2016 1013   EOSABS 0.2 07/14/2018 1208   EOSABS 0.2 08/14/2016 1013   BASOSABS 0.1 07/14/2018 1208   BASOSABS 0.0 08/14/2016 1013    CMP     Component Value Date/Time   NA 136 07/14/2018 1208   K 4.1 07/14/2018 1208   CL 100 07/14/2018 1208   CO2 29 07/14/2018 1208   GLUCOSE 104 (H) 07/14/2018 1208   BUN 16 07/14/2018 1208   CREATININE 0.99 07/14/2018 1208   CALCIUM 10.2 07/14/2018 1208   PROT 7.7 07/14/2018 1208   ALBUMIN 4.3 07/14/2018 1208   AST 23 07/14/2018 1208   ALT 26 07/14/2018 1208   ALKPHOS 61 07/14/2018 1208   BILITOT 0.8 07/14/2018 1208   GFRNONAA >60 10/27/2010 1323   GFRAA  10/27/2010 1323    >60        The eGFR has been calculated using the MDRD equation. This calculation has not been validated in all clinical situations. eGFR's persistently <60 mL/min signify possible Chronic Kidney Disease.   Assessment/Plan:  Counseled patient on the purpose proper use, and adverse effects of allopurinol and Uloric (preliminary discussion  for possible switch in future).  Discussed the importance of taking allopurinol every day to lower uric acid levels.  The possibility of recurrent gout while lowering the uric acid was explained and discussed the importance of taking colchicine daily at this time.  Provided patient with medication education material and answered all questions.    Discussed Uloric with patient - reviewed that studies showing CV events included patients with baseline cardiac risk. Patient denies history of CV events.  Chesley Mires, PharmD, MPH, BCPS, CPP Clinical Pharmacist (Rheumatology and Pulmonology)

## 2023-04-03 NOTE — Progress Notes (Signed)
Possible erosion was noted over the fifth metatarsal head. Please notify the patient.    Please call PCP to obtain most recent lab results so we can compare to labs ordered today

## 2023-04-03 NOTE — Progress Notes (Signed)
X-rays of both feet were consistent with osteoarthritis and erosive inflammatory arthritis overlap.  Erosion was noted at the base of the second proximal phalanx of the left foot.

## 2023-04-04 ENCOUNTER — Other Ambulatory Visit: Payer: Self-pay

## 2023-04-04 DIAGNOSIS — R700 Elevated erythrocyte sedimentation rate: Secondary | ICD-10-CM

## 2023-04-04 DIAGNOSIS — Z5181 Encounter for therapeutic drug level monitoring: Secondary | ICD-10-CM

## 2023-04-04 DIAGNOSIS — M1A09X0 Idiopathic chronic gout, multiple sites, without tophus (tophi): Secondary | ICD-10-CM

## 2023-04-04 LAB — CBC WITH DIFFERENTIAL/PLATELET
Absolute Monocytes: 783 {cells}/uL (ref 200–950)
Basophils Absolute: 61 {cells}/uL (ref 0–200)
Basophils Relative: 0.7 %
Eosinophils Absolute: 113 {cells}/uL (ref 15–500)
Eosinophils Relative: 1.3 %
HCT: 45.5 % (ref 38.5–50.0)
Hemoglobin: 15.3 g/dL (ref 13.2–17.1)
Lymphs Abs: 1331 {cells}/uL (ref 850–3900)
MCH: 31.2 pg (ref 27.0–33.0)
MCHC: 33.6 g/dL (ref 32.0–36.0)
MCV: 92.9 fL (ref 80.0–100.0)
MPV: 9.3 fL (ref 7.5–12.5)
Monocytes Relative: 9 %
Neutro Abs: 6412 {cells}/uL (ref 1500–7800)
Neutrophils Relative %: 73.7 %
Platelets: 222 10*3/uL (ref 140–400)
RBC: 4.9 10*6/uL (ref 4.20–5.80)
RDW: 12 % (ref 11.0–15.0)
Total Lymphocyte: 15.3 %
WBC: 8.7 10*3/uL (ref 3.8–10.8)

## 2023-04-04 LAB — COMPLETE METABOLIC PANEL WITH GFR
AG Ratio: 1.4 (calc) (ref 1.0–2.5)
ALT: 21 U/L (ref 9–46)
AST: 19 U/L (ref 10–35)
Albumin: 4.2 g/dL (ref 3.6–5.1)
Alkaline phosphatase (APISO): 87 U/L (ref 35–144)
BUN: 10 mg/dL (ref 7–25)
CO2: 27 mmol/L (ref 20–32)
Calcium: 10.2 mg/dL (ref 8.6–10.3)
Chloride: 100 mmol/L (ref 98–110)
Creat: 0.85 mg/dL (ref 0.70–1.35)
Globulin: 3 g/dL (ref 1.9–3.7)
Glucose, Bld: 156 mg/dL — ABNORMAL HIGH (ref 65–99)
Potassium: 4.1 mmol/L (ref 3.5–5.3)
Sodium: 137 mmol/L (ref 135–146)
Total Bilirubin: 0.7 mg/dL (ref 0.2–1.2)
Total Protein: 7.2 g/dL (ref 6.1–8.1)
eGFR: 97 mL/min/{1.73_m2} (ref >=60)

## 2023-04-04 LAB — C-REACTIVE PROTEIN: CRP: 8.4 mg/L — ABNORMAL HIGH (ref <8.0)

## 2023-04-04 LAB — URIC ACID: Uric Acid, Serum: 5.3 mg/dL (ref 4.0–8.0)

## 2023-04-04 LAB — SEDIMENTATION RATE: Sed Rate: 33 mm/h — ABNORMAL HIGH (ref 0–20)

## 2023-04-04 MED ORDER — ALLOPURINOL 300 MG PO TABS: 450.0000 mg | ORAL_TABLET | Freq: Every day | ORAL | 0 refills | Status: DC

## 2023-04-04 NOTE — Progress Notes (Unsigned)
Per lab note on 04/04/2023:  His uric acid level could be artificially low since he is currently having a gout flare in his right foot.  Plan to still increase the dose of allopurinol to 450 mg daily.    Allopurinol 450mg  po every day prescription has been sent to the pharmacy- verbal order from Dr. Pollyann Savoy.   I called patient and left message to advised that allopurinol rx has been sent and I will clarify indomethacin with Ladona Ridgel on Tuesday.   Will clarify indomethacin with Ladona Ridgel on Tuesday per Dr. Corliss Skains.

## 2023-04-04 NOTE — Progress Notes (Signed)
I attempted to call the patient to review lab work.  Glucose was 156, rest of CMP within normal limits.  CBC within normal limits.  Sed rate and CRP are elevated consistent with inflammation.  Uric acid was 5.3.  His uric acid level could be artificially low since he is currently having a gout flare in his right foot.  Plan to still increase the dose of allopurinol to 450 mg daily.  Please send a new prescription for allopurinol to the pharmacy.   Patient has also requested a refill of indomethacin to the pharmacy.  Recheck lab work in 1 month

## 2023-04-08 MED ORDER — INDOMETHACIN ER 75 MG PO CPCR: ORAL_CAPSULE | ORAL | 0 refills | Status: DC

## 2023-04-08 MED ORDER — ALLOPURINOL 100 MG PO TABS: ORAL_TABLET | ORAL | 0 refills | Status: DC

## 2023-04-08 NOTE — Progress Notes (Unsigned)
Spoke with patient and patient states he was taking indomethacin 75mg  capsules twice daily when he was first prescribed the medication but then reduced the dose to to 1 capsule daily. Patient increased the allopurinol on Friday, 04/04/2023 to 450mg  and states his gout pain level increased tremendously. Patient states he had a really tough week so he increased the indomethacin back to 2 capsules daily to help alleviate some of the pain. In regards to the allopurinol, patient states he took 375mg  and seemed to have tolerated that better than the 450mg .

## 2023-04-08 NOTE — Progress Notes (Signed)
We can send in allopurinol 100 mg tablets if her would prefer to increase allopurinol more gradually.   He would take allopurinol 400 mg daily for 1 week and then increase to 450 mg daily.   Ok to refill indomethacin 75 mg 1 tablet by mouth daily with food as needed.

## 2023-04-08 NOTE — Progress Notes (Signed)
Please clarify dose of indomethacin he has been taking?   Ok to recheck ESR and CRP in 1 month with routine lab work.

## 2023-04-08 NOTE — Progress Notes (Unsigned)
Advised patient that  We can send in allopurinol 100 mg tablets if her would prefer to increase allopurinol more gradually.   He would take allopurinol 400 mg daily for 1 week and then increase to 450 mg daily.   Ok to refill indomethacin 75 mg 1 tablet by mouth daily with food as needed.      Patient verbalized understanding and is agreement.   Please review prescriptions and send to the pharmacy.

## 2023-04-09 ENCOUNTER — Telehealth: Payer: Self-pay | Admitting: *Deleted

## 2023-04-09 NOTE — Telephone Encounter (Signed)
Submitted a Prior Authorization request to Medicaid for Indomethacin via CoverMyMeds. Will update once we receive a response.

## 2023-04-15 ENCOUNTER — Telehealth: Payer: Self-pay | Admitting: *Deleted

## 2023-04-15 NOTE — Telephone Encounter (Signed)
Labs received from:[facility]  Drawn on: 03/24/2023  Reviewed by:Sherron Ales, PA-C  Labs drawn: CBG, CMP, Hepatic Function Panel, PSA, UA, Hgb A1C  Results:Hgb A1C 6.1

## 2023-05-09 ENCOUNTER — Other Ambulatory Visit: Payer: Self-pay | Admitting: *Deleted

## 2023-05-09 ENCOUNTER — Other Ambulatory Visit: Payer: Self-pay

## 2023-05-09 DIAGNOSIS — N401 Enlarged prostate with lower urinary tract symptoms: Secondary | ICD-10-CM

## 2023-05-09 DIAGNOSIS — M1A09X0 Idiopathic chronic gout, multiple sites, without tophus (tophi): Secondary | ICD-10-CM

## 2023-05-09 DIAGNOSIS — Z5181 Encounter for therapeutic drug level monitoring: Secondary | ICD-10-CM

## 2023-05-09 DIAGNOSIS — R700 Elevated erythrocyte sedimentation rate: Secondary | ICD-10-CM

## 2023-05-10 LAB — COMPLETE METABOLIC PANEL WITH GFR
AG Ratio: 1.3 (calc) (ref 1.0–2.5)
ALT: 17 U/L (ref 9–46)
AST: 16 U/L (ref 10–35)
Albumin: 3.9 g/dL (ref 3.6–5.1)
Alkaline phosphatase (APISO): 78 U/L (ref 35–144)
BUN: 15 mg/dL (ref 7–25)
CO2: 29 mmol/L (ref 20–32)
Calcium: 10.3 mg/dL (ref 8.6–10.3)
Chloride: 103 mmol/L (ref 98–110)
Creat: 0.77 mg/dL (ref 0.70–1.35)
Globulin: 3 g/dL (ref 1.9–3.7)
Glucose, Bld: 91 mg/dL (ref 65–99)
Potassium: 4.5 mmol/L (ref 3.5–5.3)
Sodium: 139 mmol/L (ref 135–146)
Total Bilirubin: 0.6 mg/dL (ref 0.2–1.2)
Total Protein: 6.9 g/dL (ref 6.1–8.1)
eGFR: 100 mL/min/{1.73_m2} (ref >=60)

## 2023-05-10 LAB — CBC WITH DIFFERENTIAL/PLATELET
Absolute Monocytes: 429 {cells}/uL (ref 200–950)
Basophils Absolute: 41 {cells}/uL (ref 0–200)
Basophils Relative: 0.7 %
Eosinophils Absolute: 168 {cells}/uL (ref 15–500)
Eosinophils Relative: 2.9 %
HCT: 44 % (ref 38.5–50.0)
Hemoglobin: 14.6 g/dL (ref 13.2–17.1)
Lymphs Abs: 1131 {cells}/uL (ref 850–3900)
MCH: 30.7 pg (ref 27.0–33.0)
MCHC: 33.2 g/dL (ref 32.0–36.0)
MCV: 92.6 fL (ref 80.0–100.0)
MPV: 9.9 fL (ref 7.5–12.5)
Monocytes Relative: 7.4 %
Neutro Abs: 4031 {cells}/uL (ref 1500–7800)
Neutrophils Relative %: 69.5 %
Platelets: 197 10*3/uL (ref 140–400)
RBC: 4.75 10*6/uL (ref 4.20–5.80)
RDW: 12.5 % (ref 11.0–15.0)
Total Lymphocyte: 19.5 %
WBC: 5.8 10*3/uL (ref 3.8–10.8)

## 2023-05-10 LAB — C-REACTIVE PROTEIN: CRP: 7.9 mg/L (ref <8.0)

## 2023-05-10 LAB — URIC ACID: Uric Acid, Serum: 5.4 mg/dL (ref 4.0–8.0)

## 2023-05-10 LAB — PSA: PSA: 1.64 ng/mL (ref <=4.00)

## 2023-05-10 LAB — SEDIMENTATION RATE: Sed Rate: 25 mm/h — ABNORMAL HIGH (ref 0–20)

## 2023-05-11 NOTE — Progress Notes (Signed)
CBC and CMP are within normal limits.  uric acid is in the desirable range.

## 2023-05-12 NOTE — Progress Notes (Signed)
ESR remains borderline elevated but is improving. CRP WNL. PSA WNL

## 2023-05-14 ENCOUNTER — Ambulatory Visit: Payer: Medicaid Other | Attending: Physician Assistant | Admitting: Physician Assistant

## 2023-05-14 ENCOUNTER — Encounter: Payer: Self-pay | Admitting: Physician Assistant

## 2023-05-14 VITALS — BP 134/84 | HR 67 | Resp 15 | Ht 74.0 in | Wt 331.4 lb

## 2023-05-14 DIAGNOSIS — Z860100 Personal history of colon polyps, unspecified: Secondary | ICD-10-CM

## 2023-05-14 DIAGNOSIS — M19071 Primary osteoarthritis, right ankle and foot: Secondary | ICD-10-CM

## 2023-05-14 DIAGNOSIS — M1A09X0 Idiopathic chronic gout, multiple sites, without tophus (tophi): Secondary | ICD-10-CM | POA: Diagnosis not present

## 2023-05-14 DIAGNOSIS — M19012 Primary osteoarthritis, left shoulder: Secondary | ICD-10-CM

## 2023-05-14 DIAGNOSIS — N403 Nodular prostate with lower urinary tract symptoms: Secondary | ICD-10-CM

## 2023-05-14 DIAGNOSIS — L97919 Non-pressure chronic ulcer of unspecified part of right lower leg with unspecified severity: Secondary | ICD-10-CM

## 2023-05-14 DIAGNOSIS — R700 Elevated erythrocyte sedimentation rate: Secondary | ICD-10-CM | POA: Diagnosis not present

## 2023-05-14 DIAGNOSIS — M79674 Pain in right toe(s): Secondary | ICD-10-CM

## 2023-05-14 DIAGNOSIS — K760 Fatty (change of) liver, not elsewhere classified: Secondary | ICD-10-CM

## 2023-05-14 DIAGNOSIS — M7061 Trochanteric bursitis, right hip: Secondary | ICD-10-CM

## 2023-05-14 DIAGNOSIS — I83219 Varicose veins of right lower extremity with both ulcer of unspecified site and inflammation: Secondary | ICD-10-CM

## 2023-05-14 DIAGNOSIS — E291 Testicular hypofunction: Secondary | ICD-10-CM

## 2023-05-14 DIAGNOSIS — L97929 Non-pressure chronic ulcer of unspecified part of left lower leg with unspecified severity: Secondary | ICD-10-CM

## 2023-05-14 DIAGNOSIS — N401 Enlarged prostate with lower urinary tract symptoms: Secondary | ICD-10-CM

## 2023-05-14 DIAGNOSIS — M2242 Chondromalacia patellae, left knee: Secondary | ICD-10-CM

## 2023-05-14 DIAGNOSIS — Z5181 Encounter for therapeutic drug level monitoring: Secondary | ICD-10-CM

## 2023-05-14 DIAGNOSIS — I83229 Varicose veins of left lower extremity with both ulcer of unspecified site and inflammation: Secondary | ICD-10-CM

## 2023-05-14 DIAGNOSIS — N529 Male erectile dysfunction, unspecified: Secondary | ICD-10-CM

## 2023-05-14 DIAGNOSIS — G4733 Obstructive sleep apnea (adult) (pediatric): Secondary | ICD-10-CM

## 2023-05-14 DIAGNOSIS — M19072 Primary osteoarthritis, left ankle and foot: Secondary | ICD-10-CM

## 2023-05-14 DIAGNOSIS — I10 Essential (primary) hypertension: Secondary | ICD-10-CM

## 2023-05-15 NOTE — Progress Notes (Signed)
RF negative.

## 2023-05-16 LAB — CYCLIC CITRUL PEPTIDE ANTIBODY, IGG: Cyclic Citrullin Peptide Ab: <16 U

## 2023-05-16 LAB — RHEUMATOID FACTOR: Rheumatoid fact SerPl-aCnc: <10 [IU]/mL (ref <14)

## 2023-05-18 NOTE — Progress Notes (Signed)
Anti-CCP negative.

## 2023-06-12 NOTE — Progress Notes (Unsigned)
Office Visit Note  Patient: Casey Roach             Date of Birth: 1961-03-16           MRN: [id number]             PCP: Geoffry Paradise, MD Referring: Geoffry Paradise, MD Visit Date: 06/26/2023 Occupation: @GUAROCC @  Subjective:  Medication monitoring   History of Present Illness: Casey Roach is a 62 y.o. male with history of gout and osteoarthritis.  Patient was last seen in the office on 05/14/2023.  He states that he has been taking allopurinol 450 mg daily since his last office visit.  He states that over the past 3 weeks his gout has been significantly better controlled.  He is not currently experiencing a flare.  He has no joint inflammation at this time.  He has had some aching in his feet which has been manageable.  2 days ago he took indomethacin due to having to perform more yard work than usual but is otherwise been able to avoid the use of indomethacin.  He has not needed to take colchicine either.  Patient would like to have updated lab work today including PSA.     Activities of Daily Living:  Patient reports morning stiffness for 5 minutes.   Patient Denies nocturnal pain.  Difficulty dressing/grooming: Denies Difficulty climbing stairs: Denies Difficulty getting out of chair: Denies Difficulty using hands for taps, buttons, cutlery, and/or writing: Denies  Review of Systems  Constitutional:  Negative for fatigue.  HENT:  Negative for mouth sores and mouth dryness.   Eyes:  Positive for dryness.  Respiratory:  Negative for shortness of breath.   Cardiovascular:  Negative for chest pain and palpitations.  Gastrointestinal:  Negative for blood in stool, constipation and diarrhea.  Endocrine: Negative for increased urination.  Genitourinary:  Negative for involuntary urination.  Musculoskeletal:  Positive for joint pain, joint pain and morning stiffness. Negative for gait problem, joint swelling, myalgias, muscle weakness, muscle tenderness and myalgias.   Skin:  Negative for color change, rash, hair loss and sensitivity to sunlight.  Allergic/Immunologic: Negative for susceptible to infections.  Neurological:  Negative for dizziness and headaches.  Hematological:  Negative for swollen glands.  Psychiatric/Behavioral:  Negative for depressed mood and sleep disturbance. The patient is not nervous/anxious.     PMFS History:  Patient Active Problem List   Diagnosis Date Noted   Osteoarthritis 07/10/2018   Hepatic steatosis 09/15/2017   Pain in joint of right hip 08/25/2017   Varicose veins of left lower extremity with complications 02/20/2016   Varicose veins of bilateral lower extremities with other complications 01/16/2016   Insomnia 12/03/2015   History of colonic polyps 05/31/2014   Benign neoplasm of colon 05/31/2014   Varicose veins without complication 05/23/2014   Benign nodular prostatic hyperplasia with lower urinary tract symptoms 05/04/2014   ED (erectile dysfunction) of organic origin 05/04/2014   Hypogonadism in male 05/04/2014   Varicose veins of bilateral lower extremities with other complications 04/18/2014   Varicose veins of lower extremities with ulcer and inflammation (HCC) 02/08/2014   Morbid obesity (HCC) 07/21/2013   HTN (hypertension) 07/21/2013   Dyspnea 07/21/2013   ACUTE BRONCHITIS 06/29/2010   Obstructive sleep apnea 06/02/2008   HYPERTENSION 06/02/2008    Past Medical History:  Diagnosis Date   Acute bronchitis    Allergy    Morbid obesity (HCC) 06/02/2008   Qualifier: Diagnosis of  By: Renaldo Fiddler CMA, Marliss Czar  Obesity, unspecified    Obstructive sleep apnea (adult) (pediatric)    Sleep apnea    Tubular adenoma of colon 11/2008   Unspecified essential hypertension     Family History  Problem Relation Age of Onset   Diabetes Mother        borderline   Pancreatic cancer Maternal Grandmother    Colon polyps Neg Hx    Esophageal cancer Neg Hx    Rectal cancer Neg Hx    Stomach cancer Neg Hx     Colon cancer Neg Hx    Past Surgical History:  Procedure Laterality Date   COLONOSCOPY WITH PROPOFOL N/A 05/31/2014   Procedure: COLONOSCOPY WITH PROPOFOL;  Surgeon: Louis Meckel, MD;  Location: [facility];  Service: Endoscopy;  Laterality: N/A;   LASER ABLATION  03/28/2014, 05/16/2014   left leg, right leg   VARICOCELE EXCISION     Social History   Social History Narrative   Not on file   Immunization History  Administered Date(s) Administered   Influenza Split 05/06/2011, 05/05/2012, 05/05/2013, 05/05/2014, 04/05/2017, 05/09/2018   Influenza, High Dose Seasonal PF 06/25/2016, 06/24/2017, 07/06/2018, 07/08/2019, 07/10/2020   Influenza,inj,Quad PF,6+ Mos 05/06/2015   Influenza,inj,quad, With Preservative 04/07/2017   Influenza-Unspecified 05/05/2022   PFIZER(Purple Top)SARS-COV-2 Vaccination 10/28/2019, 11/22/2019, 06/18/2020, 07/10/2020     Objective: Vital Signs: BP 138/76 (BP Location: Left Arm, Patient Position: Sitting, Cuff Size: Normal)   Pulse 73   Resp 14   Ht 6\' 2"  (1.88 m)   Wt (!) 330 lb (149.7 kg)   BMI 42.37 kg/m    Physical Exam Vitals and nursing note reviewed.  Constitutional:      Appearance: He is well-developed.  HENT:     Head: Normocephalic and atraumatic.  Eyes:     Conjunctiva/sclera: Conjunctivae normal.     Pupils: Pupils are equal, round, and reactive to light.  Cardiovascular:     Rate and Rhythm: Normal rate and regular rhythm.     Heart sounds: Normal heart sounds.  Pulmonary:     Effort: Pulmonary effort is normal.     Breath sounds: Normal breath sounds.  Abdominal:     General: Bowel sounds are normal.     Palpations: Abdomen is soft.  Musculoskeletal:     Cervical back: Normal range of motion and neck supple.  Skin:    General: Skin is warm and dry.     Capillary Refill: Capillary refill takes less than 2 seconds.  Neurological:     Mental Status: He is alert and oriented to person, place, and time.  Psychiatric:         Behavior: Behavior normal.      Musculoskeletal Exam: C-spine has good range of motion.  Limited mobility lumbar spine.  Right shoulder is full range of motion.  Left shoulder is limited range of motion especially with full abduction and internal rotation.  Elbow joints, wrist joints, MCPs, PIPs, DIPs have good range of motion with no synovitis.  PIP and DIP thickening noted.  Knee joints have good ROM with no warmth or effusion. Ankle joints have good ROM with no tenderness or joint swelling.   CDAI Exam: CDAI Score: -- Patient Global: --; Provider Global: -- Swollen: --; Tender: -- Joint Exam 06/26/2023   No joint exam has been documented for this visit   There is currently no information documented on the homunculus. Go to the Rheumatology activity and complete the homunculus joint exam.  Investigation: No additional findings.  Imaging: No results  found.  Recent Labs: Lab Results  Component Value Date   WBC 5.8 05/09/2023   HGB 14.6 05/09/2023   PLT 197 05/09/2023   NA 139 05/09/2023   K 4.5 05/09/2023   CL 103 05/09/2023   CO2 29 05/09/2023   GLUCOSE 91 05/09/2023   BUN 15 05/09/2023   CREATININE 0.77 05/09/2023   BILITOT 0.6 05/09/2023   ALKPHOS 61 07/14/2018   AST 16 05/09/2023   ALT 17 05/09/2023   PROT 6.9 05/09/2023   ALBUMIN 4.3 07/14/2018   CALCIUM 10.3 05/09/2023   GFRAA  10/27/2010    >60        The eGFR has been calculated using the MDRD equation. This calculation has not been validated in all clinical situations. eGFR's persistently <60 mL/min signify possible Chronic Kidney Disease.    Speciality Comments: No specialty comments available.  Procedures:  No procedures performed Allergies: Other   Assessment / Plan:     Visit Diagnoses: Idiopathic chronic gout of multiple sites without tophus - Crystal proven gout-left knee aspiration performed Dr. Ranell Patrick.  Dx with gout 4 years ago. Uric acid was 9.3 on 12/10/22: Patient has been taking  allopurinol 450 mg daily consistently since his last office visit on 03/14/2023.  He has been tolerating allopurinol without any side effects.  He is not currently taking colchicine.  He has been taking indomethacin very sparingly for pain relief.  He is not currently experiencing any signs or symptoms of a gout flare.  He has not had active inflammation in over 3 weeks. Uric acid 5.4 on 05/09/23. Ideally his uric acid should remain less than 4.  Uric acid will be updated today.  Patient was encouraged to remain on allopurinol 450 mg daily.  He does not need any refills at this time.  He was advised to notify us if he develops any signs or symptoms of a flare.  He will follow-up in the office in 2 months or sooner if needed.  - Plan: Uric acid  Medication monitoring encounter - Allopurinol 450 mg by mouth daily. Plan to update CBC, CMP, and uric acid today.  - Plan: Uric acid, COMPLETE METABOLIC PANEL WITH GFR, CBC with Differential/Platelet  Elevated sed rate: ESR was borderline elevated but improved-25 on 05/09/23.  No inflammation noted on examination today.   Primary osteoarthritis of left shoulder - Dr. Leandro Reasoner ROM especially with abduction and internal rotation.   Trochanteric bursitis of right hip: Not currently symptomatic.    Chondromalacia patellae, left knee - X-rays of the left knee were updated on 04/03/2023 which were consistent with mild chondromalacia patella.  No chondrocalcinosis.  Under the care of orthopedics.  He had a cortisone injection performed this week which has alleviated his symptoms.  No effusion noted.  Primary osteoarthritis of both feet - X-rays are consistent with osteoarthritis and inflammatory arthritis overlap.erosive changes.  Patient continues to experience intermittent aching in his feet but has no active inflammation at this time.  He has been taking indomethacin very sparingly for symptomatic relief.  Other medical conditions are listed as follows:    Varicose veins of lower extremities with ulcer and inflammation (HCC)  Benign nodular prostatic hyperplasia with lower urinary tract symptoms -PSA 1.64 on 05/09/23. Patient requested to repeat PSA today.   Plan: PSA  Obstructive sleep apnea  Primary hypertension: BP was 138/76 today in the office.   Hepatic steatosis  Hypogonadism in male  History of colonic polyps  ED (erectile dysfunction) of organic origin  Orders: Orders Placed This Encounter  Procedures   Uric acid   PSA   COMPLETE METABOLIC PANEL WITH GFR   CBC with Differential/Platelet   No orders of the defined types were placed in this encounter.    Follow-Up Instructions: Return in about 2 months (around 08/26/2023) for Gout.   Gearldine Bienenstock, PA-C  Note - This record has been created using Dragon software.  Chart creation errors have been sought, but may not always  have been located. Such creation errors do not reflect on  the standard of medical care.

## 2023-06-26 ENCOUNTER — Ambulatory Visit: Payer: Medicaid Other | Attending: Physician Assistant | Admitting: Physician Assistant

## 2023-06-26 ENCOUNTER — Encounter: Payer: Self-pay | Admitting: Physician Assistant

## 2023-06-26 VITALS — BP 138/76 | HR 73 | Resp 14 | Ht 74.0 in | Wt 330.0 lb

## 2023-06-26 DIAGNOSIS — I10 Essential (primary) hypertension: Secondary | ICD-10-CM

## 2023-06-26 DIAGNOSIS — M19012 Primary osteoarthritis, left shoulder: Secondary | ICD-10-CM

## 2023-06-26 DIAGNOSIS — Z5181 Encounter for therapeutic drug level monitoring: Secondary | ICD-10-CM

## 2023-06-26 DIAGNOSIS — M19071 Primary osteoarthritis, right ankle and foot: Secondary | ICD-10-CM

## 2023-06-26 DIAGNOSIS — M1A09X0 Idiopathic chronic gout, multiple sites, without tophus (tophi): Secondary | ICD-10-CM

## 2023-06-26 DIAGNOSIS — M19072 Primary osteoarthritis, left ankle and foot: Secondary | ICD-10-CM

## 2023-06-26 DIAGNOSIS — M7061 Trochanteric bursitis, right hip: Secondary | ICD-10-CM

## 2023-06-26 DIAGNOSIS — L97929 Non-pressure chronic ulcer of unspecified part of left lower leg with unspecified severity: Secondary | ICD-10-CM

## 2023-06-26 DIAGNOSIS — N403 Nodular prostate with lower urinary tract symptoms: Secondary | ICD-10-CM

## 2023-06-26 DIAGNOSIS — N529 Male erectile dysfunction, unspecified: Secondary | ICD-10-CM

## 2023-06-26 DIAGNOSIS — K760 Fatty (change of) liver, not elsewhere classified: Secondary | ICD-10-CM

## 2023-06-26 DIAGNOSIS — R700 Elevated erythrocyte sedimentation rate: Secondary | ICD-10-CM

## 2023-06-26 DIAGNOSIS — Z860100 Personal history of colon polyps, unspecified: Secondary | ICD-10-CM

## 2023-06-26 DIAGNOSIS — L97919 Non-pressure chronic ulcer of unspecified part of right lower leg with unspecified severity: Secondary | ICD-10-CM

## 2023-06-26 DIAGNOSIS — M2242 Chondromalacia patellae, left knee: Secondary | ICD-10-CM

## 2023-06-26 DIAGNOSIS — I83219 Varicose veins of right lower extremity with both ulcer of unspecified site and inflammation: Secondary | ICD-10-CM

## 2023-06-26 DIAGNOSIS — I83229 Varicose veins of left lower extremity with both ulcer of unspecified site and inflammation: Secondary | ICD-10-CM

## 2023-06-26 DIAGNOSIS — G4733 Obstructive sleep apnea (adult) (pediatric): Secondary | ICD-10-CM

## 2023-06-26 DIAGNOSIS — E291 Testicular hypofunction: Secondary | ICD-10-CM

## 2023-06-26 DIAGNOSIS — N401 Enlarged prostate with lower urinary tract symptoms: Secondary | ICD-10-CM

## 2023-06-26 NOTE — Progress Notes (Signed)
WBC count is elevated-absolute neutrophils are elevated--please clarify if he has had a recent infection?

## 2023-06-27 LAB — COMPLETE METABOLIC PANEL WITH GFR
AG Ratio: 1.6 (calc) (ref 1.0–2.5)
ALT: 17 U/L (ref 9–46)
AST: 17 U/L (ref 10–35)
Albumin: 4.3 g/dL (ref 3.6–5.1)
Alkaline phosphatase (APISO): 86 U/L (ref 35–144)
BUN: 14 mg/dL (ref 7–25)
CO2: 25 mmol/L (ref 20–32)
Calcium: 10.6 mg/dL — ABNORMAL HIGH (ref 8.6–10.3)
Chloride: 105 mmol/L (ref 98–110)
Creat: 0.78 mg/dL (ref 0.70–1.35)
Globulin: 2.7 g/dL (ref 1.9–3.7)
Glucose, Bld: 129 mg/dL — ABNORMAL HIGH (ref 65–99)
Potassium: 4.3 mmol/L (ref 3.5–5.3)
Sodium: 138 mmol/L (ref 135–146)
Total Bilirubin: 0.5 mg/dL (ref 0.2–1.2)
Total Protein: 7 g/dL (ref 6.1–8.1)
eGFR: 100 mL/min/{1.73_m2} (ref >=60)

## 2023-06-27 LAB — CBC WITH DIFFERENTIAL/PLATELET
Absolute Lymphocytes: 819 {cells}/uL — ABNORMAL LOW (ref 850–3900)
Absolute Monocytes: 529 {cells}/uL (ref 200–950)
Basophils Absolute: 25 {cells}/uL (ref 0–200)
Basophils Relative: 0.2 %
Eosinophils Absolute: 0 {cells}/uL — ABNORMAL LOW (ref 15–500)
Eosinophils Relative: 0 %
HCT: 46.9 % (ref 38.5–50.0)
Hemoglobin: 15.6 g/dL (ref 13.2–17.1)
MCH: 30.2 pg (ref 27.0–33.0)
MCHC: 33.3 g/dL (ref 32.0–36.0)
MCV: 90.7 fL (ref 80.0–100.0)
MPV: 10 fL (ref 7.5–12.5)
Monocytes Relative: 4.2 %
Neutro Abs: 11227 {cells}/uL — ABNORMAL HIGH (ref 1500–7800)
Neutrophils Relative %: 89.1 %
Platelets: 222 10*3/uL (ref 140–400)
RBC: 5.17 10*6/uL (ref 4.20–5.80)
RDW: 12.5 % (ref 11.0–15.0)
Total Lymphocyte: 6.5 %
WBC: 12.6 10*3/uL — ABNORMAL HIGH (ref 3.8–10.8)

## 2023-06-27 LAB — PSA: PSA: 1.14 ng/mL (ref <=4.00)

## 2023-06-27 LAB — URIC ACID: Uric Acid, Serum: 3.6 mg/dL — ABNORMAL LOW (ref 4.0–8.0)

## 2023-06-27 NOTE — Progress Notes (Signed)
Uric acid is within the desirable range-3.6. continue current dose of allopurinol.  PSA WNL  Glucose is 129. Calcium is elevated-10.6-avoid a calcium supplement at this time. rest of CMP WNL.

## 2023-08-13 NOTE — Progress Notes (Unsigned)
Office Visit Note  Patient: Casey Roach             Date of Birth: July 31, 1961           MRN: [id number]             PCP: Geoffry Paradise, MD Referring: Geoffry Paradise, MD Visit Date: 08/27/2023 Occupation: @GUAROCC @  Subjective:  Medication monitoring   History of Present Illness: Casey Roach is a 63 y.o. male with history of gout and osteoarthritis. Patient remains on Allopurinol 450 mg by mouth daily.  He is tolerating allopurinol without any side effects.  Patient has been able to taper off indomethacin.  He denies any signs or symptoms of a gout flare since his last office visit.  He has occasional arthralgias and joint stiffness but overall his symptoms have been well-controlled on the current treatment regimen. He remains under the care of [facility] due to a history of a rotator cuff tear in the left shoulder.  He had a left knee joint cortisone injection performed in November but has not had any recurrence of pain or swelling.  Activities of Daily Living:  Patient reports morning stiffness for 0 minutes.   Patient Denies nocturnal pain.  Difficulty dressing/grooming: Denies Difficulty climbing stairs: Denies Difficulty getting out of chair: Denies Difficulty using hands for taps, buttons, cutlery, and/or writing: Denies  Review of Systems  Constitutional: Negative.  Negative for fatigue.  HENT: Negative.  Negative for mouth sores and mouth dryness.   Eyes: Negative.  Negative for dryness.  Respiratory: Negative.  Negative for shortness of breath.   Cardiovascular: Negative.  Negative for chest pain and palpitations.  Gastrointestinal: Negative.  Negative for blood in stool, constipation and diarrhea.  Endocrine: Negative.  Negative for increased urination.  Genitourinary: Negative.  Negative for involuntary urination.  Musculoskeletal: Negative.  Negative for joint pain, gait problem, joint pain, joint swelling, myalgias, muscle weakness, morning  stiffness, muscle tenderness and myalgias.  Skin: Negative.  Negative for color change, rash, hair loss and sensitivity to sunlight.  Allergic/Immunologic: Negative for susceptible to infections.  Neurological: Negative.  Negative for dizziness and headaches.  Hematological: Negative.  Negative for swollen glands.  Psychiatric/Behavioral: Negative.  Negative for depressed mood and sleep disturbance. The patient is not nervous/anxious.     PMFS History:  Patient Active Problem List   Diagnosis Date Noted   Osteoarthritis 07/10/2018   Hepatic steatosis 09/15/2017   Pain in joint of right hip 08/25/2017   Varicose veins of left lower extremity with complications 02/20/2016   Varicose veins of bilateral lower extremities with other complications 01/16/2016   Insomnia 12/03/2015   History of colonic polyps 05/31/2014   Benign neoplasm of colon 05/31/2014   Varicose veins without complication 05/23/2014   Benign nodular prostatic hyperplasia with lower urinary tract symptoms 05/04/2014   ED (erectile dysfunction) of organic origin 05/04/2014   Hypogonadism in male 05/04/2014   Varicose veins of bilateral lower extremities with other complications 04/18/2014   Varicose veins of lower extremities with ulcer and inflammation (HCC) 02/08/2014   Morbid obesity (HCC) 07/21/2013   HTN (hypertension) 07/21/2013   Dyspnea 07/21/2013   ACUTE BRONCHITIS 06/29/2010   Obstructive sleep apnea 06/02/2008   HYPERTENSION 06/02/2008    Past Medical History:  Diagnosis Date   Acute bronchitis    Allergy    Morbid obesity (HCC) 06/02/2008   Qualifier: Diagnosis of  By: Renaldo Fiddler CMA, Leigh     Obesity, unspecified  Obstructive sleep apnea (adult) (pediatric)    Sleep apnea    Tubular adenoma of colon 11/2008   Unspecified essential hypertension     Family History  Problem Relation Age of Onset   Diabetes Mother        borderline   Pancreatic cancer Maternal Grandmother    Colon polyps Neg Hx     Esophageal cancer Neg Hx    Rectal cancer Neg Hx    Stomach cancer Neg Hx    Colon cancer Neg Hx    Past Surgical History:  Procedure Laterality Date   COLONOSCOPY WITH PROPOFOL N/A 05/31/2014   Procedure: COLONOSCOPY WITH PROPOFOL;  Surgeon: Louis Meckel, MD;  Location: [facility];  Service: Endoscopy;  Laterality: N/A;   LASER ABLATION  03/28/2014, 05/16/2014   left leg, right leg   VARICOCELE EXCISION     Social History   Social History Narrative   Not on file   Immunization History  Administered Date(s) Administered   Influenza Split 05/06/2011, 05/05/2012, 05/05/2013, 05/05/2014, 04/05/2017, 05/09/2018   Influenza, High Dose Seasonal PF 06/25/2016, 06/24/2017, 07/06/2018, 07/08/2019, 07/10/2020   Influenza,inj,Quad PF,6+ Mos 05/06/2015   Influenza,inj,quad, With Preservative 04/07/2017   Influenza-Unspecified 05/05/2022   PFIZER(Purple Top)SARS-COV-2 Vaccination 10/28/2019, 11/22/2019, 06/18/2020, 07/10/2020     Objective: Vital Signs: BP 138/78   Pulse 64   Resp 16   Ht 6\' 2"  (1.88 m)   Wt (!) 338 lb (153.3 kg)   BMI 43.40 kg/m    Physical Exam Vitals and nursing note reviewed.  Constitutional:      Appearance: He is well-developed.  HENT:     Head: Normocephalic and atraumatic.  Eyes:     Conjunctiva/sclera: Conjunctivae normal.     Pupils: Pupils are equal, round, and reactive to light.  Cardiovascular:     Rate and Rhythm: Normal rate and regular rhythm.     Heart sounds: Normal heart sounds.  Pulmonary:     Effort: Pulmonary effort is normal.     Breath sounds: Normal breath sounds.  Abdominal:     General: Bowel sounds are normal.     Palpations: Abdomen is soft.  Musculoskeletal:     Cervical back: Normal range of motion and neck supple.  Skin:    General: Skin is warm and dry.     Capillary Refill: Capillary refill takes less than 2 seconds.  Neurological:     Mental Status: He is alert and oriented to person, place, and time.   Psychiatric:        Behavior: Behavior normal.      Musculoskeletal Exam: Cervical spine has good range of motion.  Right shoulder is full range of motion.  Left shoulder has limited range of motion especially with abduction and internal rotation.  Elbow joints, wrist joints, MCPs, PIPs, DIPs have good range of motion with no synovitis.  PIP and DIP thickening noted.  Both knee joints have good range of motion with no warmth or effusion.  Ankle joints have good range of motion with no tenderness or joint swelling.  CDAI Exam: CDAI Score: -- Patient Global: --; Provider Global: -- Swollen: --; Tender: -- Joint Exam 08/27/2023   No joint exam has been documented for this visit   There is currently no information documented on the homunculus. Go to the Rheumatology activity and complete the homunculus joint exam.  Investigation: No additional findings.  Imaging: No results found.  Recent Labs: Lab Results  Component Value Date   WBC 12.6 (H)  06/26/2023   HGB 15.6 06/26/2023   PLT 222 06/26/2023   NA 138 06/26/2023   K 4.3 06/26/2023   CL 105 06/26/2023   CO2 25 06/26/2023   GLUCOSE 129 (H) 06/26/2023   BUN 14 06/26/2023   CREATININE 0.78 06/26/2023   BILITOT 0.5 06/26/2023   ALKPHOS 61 07/14/2018   AST 17 06/26/2023   ALT 17 06/26/2023   PROT 7.0 06/26/2023   ALBUMIN 4.3 07/14/2018   CALCIUM 10.6 (H) 06/26/2023   GFRAA  10/27/2010    >60        The eGFR has been calculated using the MDRD equation. This calculation has not been validated in all clinical situations. eGFR's persistently <60 mL/min signify possible Chronic Kidney Disease.    Speciality Comments: No specialty comments available.  Procedures:  No procedures performed Allergies: Other    Assessment / Plan:     Visit Diagnoses: Idiopathic chronic gout of multiple sites without tophus - Crystal proven gout-left knee aspiration performed Dr. Ranell Patrick.  Dx with gout 4 years ago. Uric acid was 9.3 on  12/10/22: He has not had any signs or symptoms of a gout flare since his last office visit.  He has clinically been doing well taking allopurinol 4 to 50 mg daily.  He is tolerating allopurinol without any side effects and has not had any recent gaps in therapy.  He has been able to taper off of indomethacin and does not require refill at this time. His uric acid level was within a desirable range 3.6 on 06/26/2023.  According to the patient he recently had his uric acid level rechecked by his PCP at which time was 3.8.  Plan to update uric acid level today along with CBC and CMP.  Sed rate will also be rechecked.  He was advised to notify us if he develops signs or symptoms of a gout flare.  He will remain on allopurinol 450 mg daily.  A refill of allopurinol was sent to the pharmacy today.  He will follow-up in the office in 3 months or sooner if needed.  - Plan: Uric acid  Medication monitoring encounter - Allopurinol 450 mg by mouth daily. A refill of allopurinol was sent to the pharmacy.  Uric acid was within the desirable range-3.6 on 06/26/23.   CBC and CMP updated on 06/26/23.  - Plan: CBC with Differential/Platelet, COMPLETE METABOLIC PANEL WITH GFR, Uric acid  Elevated sed rate -ESR was 25 on 05/09/2023.  Sed rate will be rechecked today.  Plan: Sedimentation rate  Primary osteoarthritis of left shoulder - Dr. Silvana Newness has a history of a rotator cuff tear.  He has limited range of motion on examination today.  Trochanteric bursitis of right hip: Not currently symptomatic.   Chondromalacia patellae, left knee:  Under the care of [facility].    Primary osteoarthritis of both feet: He is not experiencing any increased discomfort in his feet at this time.  He is wearing proper fitting shoes and compression socks.   Other medical conditions are listed as follows:   Varicose veins of lower extremities with ulcer and inflammation (HCC)  Benign nodular prostatic hyperplasia with  lower urinary tract symptoms -Patient requested to have updated PSA today.  Plan: PSA  Obstructive sleep apnea  Primary hypertension: BP was 138/78 today in the office.   Hepatic steatosis  Hypogonadism in male  History of colonic polyps  ED (erectile dysfunction) of organic origin  History of proctitis - Patient requested to have PSA  rechecked today.  Results will be forwarded to PCP as requested. Plan: PSA  Disorder of prostate, unspecified - Patient requested to have PSA rechecked today. Plan: PSA  Orders: Orders Placed This Encounter  Procedures   CBC with Differential/Platelet   COMPLETE METABOLIC PANEL WITH GFR   Uric acid   PSA   Sedimentation rate   Meds ordered this encounter  Medications   allopurinol (ZYLOPRIM) 300 MG tablet    Sig: Take 1.5 tablets (450 mg total) by mouth daily.    Dispense:  135 tablet    Refill:  0    Please pre-cut tablets. Thanks!      Follow-Up Instructions: Return in about 3 months (around 11/25/2023).   Gearldine Bienenstock, PA-C  Note - This record has been created using Dragon software.  Chart creation errors have been sought, but may not always  have been located. Such creation errors do not reflect on  the standard of medical care.

## 2023-08-27 ENCOUNTER — Encounter: Payer: Self-pay | Admitting: Physician Assistant

## 2023-08-27 ENCOUNTER — Ambulatory Visit: Payer: Medicaid Other | Attending: Physician Assistant | Admitting: Physician Assistant

## 2023-08-27 ENCOUNTER — Other Ambulatory Visit (HOSPITAL_COMMUNITY): Payer: Self-pay

## 2023-08-27 VITALS — BP 138/78 | HR 64 | Resp 16 | Ht 74.0 in | Wt 338.0 lb

## 2023-08-27 DIAGNOSIS — M19071 Primary osteoarthritis, right ankle and foot: Secondary | ICD-10-CM

## 2023-08-27 DIAGNOSIS — M2242 Chondromalacia patellae, left knee: Secondary | ICD-10-CM | POA: Diagnosis present

## 2023-08-27 DIAGNOSIS — L97919 Non-pressure chronic ulcer of unspecified part of right lower leg with unspecified severity: Secondary | ICD-10-CM

## 2023-08-27 DIAGNOSIS — N401 Enlarged prostate with lower urinary tract symptoms: Secondary | ICD-10-CM | POA: Insufficient documentation

## 2023-08-27 DIAGNOSIS — E291 Testicular hypofunction: Secondary | ICD-10-CM | POA: Diagnosis present

## 2023-08-27 DIAGNOSIS — R700 Elevated erythrocyte sedimentation rate: Secondary | ICD-10-CM | POA: Diagnosis present

## 2023-08-27 DIAGNOSIS — N429 Disorder of prostate, unspecified: Secondary | ICD-10-CM

## 2023-08-27 DIAGNOSIS — M19012 Primary osteoarthritis, left shoulder: Secondary | ICD-10-CM | POA: Diagnosis present

## 2023-08-27 DIAGNOSIS — G4733 Obstructive sleep apnea (adult) (pediatric): Secondary | ICD-10-CM | POA: Diagnosis present

## 2023-08-27 DIAGNOSIS — Z860100 Personal history of colon polyps, unspecified: Secondary | ICD-10-CM

## 2023-08-27 DIAGNOSIS — N529 Male erectile dysfunction, unspecified: Secondary | ICD-10-CM | POA: Diagnosis present

## 2023-08-27 DIAGNOSIS — M7061 Trochanteric bursitis, right hip: Secondary | ICD-10-CM

## 2023-08-27 DIAGNOSIS — I10 Essential (primary) hypertension: Secondary | ICD-10-CM

## 2023-08-27 DIAGNOSIS — K760 Fatty (change of) liver, not elsewhere classified: Secondary | ICD-10-CM

## 2023-08-27 DIAGNOSIS — L97929 Non-pressure chronic ulcer of unspecified part of left lower leg with unspecified severity: Secondary | ICD-10-CM | POA: Diagnosis present

## 2023-08-27 DIAGNOSIS — N403 Nodular prostate with lower urinary tract symptoms: Secondary | ICD-10-CM

## 2023-08-27 DIAGNOSIS — Z8719 Personal history of other diseases of the digestive system: Secondary | ICD-10-CM

## 2023-08-27 DIAGNOSIS — Z5181 Encounter for therapeutic drug level monitoring: Secondary | ICD-10-CM | POA: Diagnosis present

## 2023-08-27 DIAGNOSIS — M19072 Primary osteoarthritis, left ankle and foot: Secondary | ICD-10-CM | POA: Insufficient documentation

## 2023-08-27 DIAGNOSIS — M1A09X0 Idiopathic chronic gout, multiple sites, without tophus (tophi): Secondary | ICD-10-CM | POA: Diagnosis present

## 2023-08-27 DIAGNOSIS — I83219 Varicose veins of right lower extremity with both ulcer of unspecified site and inflammation: Secondary | ICD-10-CM | POA: Insufficient documentation

## 2023-08-27 DIAGNOSIS — I83229 Varicose veins of left lower extremity with both ulcer of unspecified site and inflammation: Secondary | ICD-10-CM | POA: Insufficient documentation

## 2023-08-27 MED ORDER — ALLOPURINOL 300 MG PO TABS: 450.0000 mg | ORAL_TABLET | Freq: Every day | ORAL | 0 refills | Status: DC

## 2023-08-28 LAB — CBC WITH DIFFERENTIAL/PLATELET
Absolute Lymphocytes: 1267 {cells}/uL (ref 850–3900)
Absolute Monocytes: 482 {cells}/uL (ref 200–950)
Basophils Absolute: 59 {cells}/uL (ref 0–200)
Basophils Relative: 0.9 %
Eosinophils Absolute: 132 {cells}/uL (ref 15–500)
Eosinophils Relative: 2 %
HCT: 48.7 % (ref 38.5–50.0)
Hemoglobin: 16 g/dL (ref 13.2–17.1)
MCH: 30.8 pg (ref 27.0–33.0)
MCHC: 32.9 g/dL (ref 32.0–36.0)
MCV: 93.8 fL (ref 80.0–100.0)
MPV: 9.8 fL (ref 7.5–12.5)
Monocytes Relative: 7.3 %
Neutro Abs: 4660 {cells}/uL (ref 1500–7800)
Neutrophils Relative %: 70.6 %
Platelets: 167 10*3/uL (ref 140–400)
RBC: 5.19 10*6/uL (ref 4.20–5.80)
RDW: 13.1 % (ref 11.0–15.0)
Total Lymphocyte: 19.2 %
WBC: 6.6 10*3/uL (ref 3.8–10.8)

## 2023-08-28 LAB — COMPLETE METABOLIC PANEL WITH GFR
AG Ratio: 1.6 (calc) (ref 1.0–2.5)
ALT: 17 U/L (ref 9–46)
AST: 17 U/L (ref 10–35)
Albumin: 4 g/dL (ref 3.6–5.1)
Alkaline phosphatase (APISO): 76 U/L (ref 35–144)
BUN: 14 mg/dL (ref 7–25)
CO2: 27 mmol/L (ref 20–32)
Calcium: 9.9 mg/dL (ref 8.6–10.3)
Chloride: 103 mmol/L (ref 98–110)
Creat: 0.92 mg/dL (ref 0.70–1.35)
Globulin: 2.5 g/dL (ref 1.9–3.7)
Glucose, Bld: 132 mg/dL — ABNORMAL HIGH (ref 65–99)
Potassium: 4.4 mmol/L (ref 3.5–5.3)
Sodium: 138 mmol/L (ref 135–146)
Total Bilirubin: 0.6 mg/dL (ref 0.2–1.2)
Total Protein: 6.5 g/dL (ref 6.1–8.1)
eGFR: 92 mL/min/{1.73_m2} (ref >=60)

## 2023-08-28 LAB — URIC ACID: Uric Acid, Serum: 4.5 mg/dL (ref 4.0–8.0)

## 2023-08-28 LAB — SEDIMENTATION RATE: Sed Rate: 6 mm/h (ref 0–20)

## 2023-08-28 LAB — PSA: PSA: 1.14 ng/mL (ref <=4.00)

## 2023-08-28 NOTE — Progress Notes (Signed)
Glucose is 132. Rest of CMP WNL.   CBC WNL ESR WNL PSA WNL Uric acid is WNL--4.5. please advise patient to continue current treatment regimen.

## 2023-09-09 DIAGNOSIS — J3081 Allergic rhinitis due to animal (cat) (dog) hair and dander: Secondary | ICD-10-CM | POA: Diagnosis not present

## 2023-09-09 DIAGNOSIS — J3089 Other allergic rhinitis: Secondary | ICD-10-CM | POA: Diagnosis not present

## 2023-09-09 DIAGNOSIS — J301 Allergic rhinitis due to pollen: Secondary | ICD-10-CM | POA: Diagnosis not present

## 2023-09-15 DIAGNOSIS — Z85828 Personal history of other malignant neoplasm of skin: Secondary | ICD-10-CM | POA: Diagnosis not present

## 2023-09-15 DIAGNOSIS — L723 Sebaceous cyst: Secondary | ICD-10-CM | POA: Diagnosis not present

## 2023-09-15 DIAGNOSIS — L570 Actinic keratosis: Secondary | ICD-10-CM | POA: Diagnosis not present

## 2023-09-15 DIAGNOSIS — D225 Melanocytic nevi of trunk: Secondary | ICD-10-CM | POA: Diagnosis not present

## 2023-09-15 DIAGNOSIS — L718 Other rosacea: Secondary | ICD-10-CM | POA: Diagnosis not present

## 2023-09-23 DIAGNOSIS — N529 Male erectile dysfunction, unspecified: Secondary | ICD-10-CM | POA: Diagnosis not present

## 2023-09-23 DIAGNOSIS — N401 Enlarged prostate with lower urinary tract symptoms: Secondary | ICD-10-CM | POA: Diagnosis not present

## 2023-09-30 DIAGNOSIS — M19012 Primary osteoarthritis, left shoulder: Secondary | ICD-10-CM | POA: Diagnosis not present

## 2023-09-30 DIAGNOSIS — M25562 Pain in left knee: Secondary | ICD-10-CM | POA: Diagnosis not present

## 2023-09-30 DIAGNOSIS — M25512 Pain in left shoulder: Secondary | ICD-10-CM | POA: Diagnosis not present

## 2023-10-06 DIAGNOSIS — J3089 Other allergic rhinitis: Secondary | ICD-10-CM | POA: Diagnosis not present

## 2023-10-06 DIAGNOSIS — J3081 Allergic rhinitis due to animal (cat) (dog) hair and dander: Secondary | ICD-10-CM | POA: Diagnosis not present

## 2023-10-06 DIAGNOSIS — J301 Allergic rhinitis due to pollen: Secondary | ICD-10-CM | POA: Diagnosis not present

## 2023-10-08 NOTE — Progress Notes (Addendum)
 HPI male never smoker followed for OSA, bronchitis, complicated by allergic rhinitis/ asthma( Dr Valera Gaster), morbid obesity, HBP,  NPSG 10/21/97-AHI 40/hour, desaturation to 80%, body weight 320 pounds -------------------------------------------------------------------------------------------   10/08/22- 62 year old male never smoker followed for OSA, bronchitis, complicated by allergic rhinitis( Dr Valera Gaster), morbid obesity -Breo 100, Ventolin  HFA, Flonase, CPAP 11/Adapt  AirSense 10 AutoSet   replaced 10/29/21-  Download compliance-100%, AHI 0.5/ hr Body weight today 351 lbs Covid vax-4 Phizer Flu vax-had His asthmatic bronchitis is managed by his allergy office. -----No issues with cpap machine states its working well Download reviewed.  He is very comfortable with CPAP.  No concerns. I noticed him sneezing and he said he had not taken his Allegra-D yet this morning.  Denies other health issues.  We did discuss difference between his home blood pressure cuff and pressures obtained at medical offices.  I suggested he bring his cuff with him for comparison.  10/09/23- 63 year old male never smoker followed for OSA, bronchitis, complicated by allergic rhinitis( Dr Valera Gaster), morbid obesity -Symbicort  160, Ventolin  HFA, Flonase, CPAP 11/Adapt  AirSense 10 AutoSet   replaced 10/29/21-  Download compliance- 100%, AHI 0.7/hr Body weight today-342 lbs Discussed the use of AI scribe software for clinical note transcription with the patient, who gave verbal consent to proceed. History of Present Illness   The patient, with a history of sleep apnea and asthma, presents for a routine follow-up. He reports good compliance with his CPAP machine, which he has been using for 36 years, and he has not experienced any discomfort or issues with its use. This machine is 65 years old .The patient also reports that he has been managing his asthma with Symbicort  and Ventolin , the latter of which is used rarely for quick  relief. He also takes Allegra as an antihistamine during pollen season. The patient denies any changes or issues with his current regimen.   Addendum 12/17/23- Surgical Clearance- As of 10/09/23 exam, patient stable from a pulmonary standpoint to go forward with planned shoulder surgery. Recommend he use his CPAP 11 cwp in Recovery after anesthesia.    ROS-see HPI + = positive Constitutional:   No-  unexplained weight loss, night sweats, fevers, chills, fatigue, lassitude. HEENT:   No-  headaches, difficulty swallowing, tooth/dental problems, sore throat,       No-  sneezing, itching, ear ache, nasal congestion, post nasal drip,  CV:  No-   chest pain, orthopnea, PND, swelling in lower extremities, anasarca, dizziness, palpitations Resp: No-  acute shortness of breath with exertion or at rest.              No-   productive cough,  + non-productive cough,  No- coughing up of blood.              No-   change in color of mucus.  No- wheezing.   Skin: No-   rash or lesions. GI:  No-   heartburn, indigestion, abdominal pain, nausea, vomiting, GU:  MS:  No-   joint pain or swelling.  Neuro-     nothing unusual Psych:  No- change in mood or affect. No depression or anxiety.  No memory loss.  OBJ- Physical Exam General- Alert, Oriented, Affect-appropriate, Distress- none acute, morbidly obese Skin- rash-none, lesions- none, excoriation- none, + plethoric Lymphadenopathy- none Head- atraumatic            Eyes- Gross vision intact, PERRLA, conjunctivae and secretions clear, ? proptosis  Ears- Hearing, canals-normal            Nose- Clear, no-Septal dev, mucus, polyps, erosion, perforation             Throat- Mallampati III , mucosa clear , drainage- none, tonsils- atrophic Neck- flexible , trachea midline, no stridor , thyroid nl, carotid no bruit Chest - symmetrical excursion , unlabored           Heart/CV- RRR , no murmur , no gallop  , no rub, nl s1 s2                           - JVD-  none , edema- none, stasis changes- none, varices- none           Lung- clear to P&A, wheeze- none, cough- none , dullness-none, rub- none           Chest wall-  Abd-  Br/ Gen/ Rectal- Not done, not indicated Extrem- cyanosis- none, clubbing, none, atrophy- none, strength- nl Neuro- grossly intact to observation Assessment and Plan    Obstructive Sleep Apnea Effectively managed with CPAP therapy for 36 years. Current machine functioning well at 10.6 cm H2O. No issues with comfort or adherence. Discussed Inspire, deemed unnecessary due to CPAP success. - Continue CPAP therapy at current settings.  Mild Intermittent Asthma Well-controlled with Symbicort  two to three times a week and rare Ventolin  use. No exacerbations or sleep-related breathing issues. - Continue current asthma management regimen.  Allergic Rhinitis Seasonal allergic rhinitis managed with Allegra as needed. Newer antihistamines preferred due to reduced drowsiness. - Continue Allegra as needed during pollen season.   Obesity - continue efforts  Follow-up Comfortable with current management plan for obstructive sleep apnea and asthma. - Schedule follow-up in one year.

## 2023-10-09 ENCOUNTER — Ambulatory Visit: Payer: Medicaid Other | Admitting: Internal Medicine

## 2023-10-09 ENCOUNTER — Encounter: Payer: Self-pay | Admitting: Internal Medicine

## 2023-10-09 VITALS — BP 134/84 | HR 66 | Temp 97.7°F | Ht 75.0 in | Wt 342.4 lb

## 2023-10-09 DIAGNOSIS — J4520 Mild intermittent asthma, uncomplicated: Secondary | ICD-10-CM

## 2023-10-09 DIAGNOSIS — G4733 Obstructive sleep apnea (adult) (pediatric): Secondary | ICD-10-CM | POA: Diagnosis not present

## 2023-10-09 DIAGNOSIS — J309 Allergic rhinitis, unspecified: Secondary | ICD-10-CM | POA: Diagnosis not present

## 2023-10-09 NOTE — Patient Instructions (Signed)
 Glad you are doing well - We can continue CPAP 11  Please cal if we can help

## 2023-11-03 DIAGNOSIS — J301 Allergic rhinitis due to pollen: Secondary | ICD-10-CM | POA: Diagnosis not present

## 2023-11-03 DIAGNOSIS — J3081 Allergic rhinitis due to animal (cat) (dog) hair and dander: Secondary | ICD-10-CM | POA: Diagnosis not present

## 2023-11-03 DIAGNOSIS — J3089 Other allergic rhinitis: Secondary | ICD-10-CM | POA: Diagnosis not present

## 2023-11-05 DIAGNOSIS — K080 Exfoliation of teeth due to systemic causes: Secondary | ICD-10-CM | POA: Diagnosis not present

## 2023-11-05 DIAGNOSIS — G4733 Obstructive sleep apnea (adult) (pediatric): Secondary | ICD-10-CM | POA: Diagnosis not present

## 2023-11-11 NOTE — Progress Notes (Signed)
 Office Visit Note  Patient: Casey Roach             Date of Birth: 07/20/1961           MRN: [id number]             PCP: Suan Elm, MD Referring: Suan Elm, MD Visit Date: 11/25/2023 Occupation: @GUAROCC @  Subjective:  Medication monitoring   History of Present Illness: Casey Roach is a 63 y.o. male with history of gout and osteoarthritis.  Patient remains on allopurinol  450 mg by mouth daily.  He takes colchicine on an as needed basis.  He is completely off of indomethacin .  He denies any signs or symptoms of a gout flare recently.  He denies any warmth or swelling his feet, ankles, or knees at this time.  He has intermittent discomfort and stiffness in the left shoulder but he is not yet ready to proceed with a left shoulder reverse replacement.      Activities of Daily Living:  Patient reports morning stiffness for 0 minutes.   Patient Denies nocturnal pain.  Difficulty dressing/grooming: Denies Difficulty climbing stairs: Denies Difficulty getting out of chair: Denies Difficulty using hands for taps, buttons, cutlery, and/or writing: Denies  Review of Systems  Constitutional:  Negative for fatigue.  HENT:  Negative for mouth sores, mouth dryness and nose dryness.   Eyes:  Negative for pain and dryness.  Respiratory:  Negative for shortness of breath and difficulty breathing.   Cardiovascular:  Negative for chest pain and palpitations.  Gastrointestinal:  Negative for blood in stool, constipation and diarrhea.  Endocrine: Negative for increased urination.  Genitourinary:  Negative for involuntary urination.  Musculoskeletal:  Negative for joint pain, gait problem, joint pain, joint swelling, myalgias, muscle weakness, morning stiffness, muscle tenderness and myalgias.  Skin:  Negative for color change, rash, hair loss and sensitivity to sunlight.  Allergic/Immunologic: Negative for susceptible to infections.  Neurological:  Negative for dizziness and  headaches.  Hematological:  Negative for swollen glands.  Psychiatric/Behavioral:  Negative for depressed mood and sleep disturbance. The patient is not nervous/anxious.     PMFS History:  Patient Active Problem List   Diagnosis Date Noted   Osteoarthritis 07/10/2018   Hepatic steatosis 09/15/2017   Pain in joint of right hip 08/25/2017   Varicose veins of left lower extremity with complications 02/20/2016   Varicose veins of bilateral lower extremities with other complications 01/16/2016   Insomnia 12/03/2015   History of colonic polyps 05/31/2014   Benign neoplasm of colon 05/31/2014   Varicose veins without complication 05/23/2014   Benign nodular prostatic hyperplasia with lower urinary tract symptoms 05/04/2014   ED (erectile dysfunction) of organic origin 05/04/2014   Hypogonadism in male 05/04/2014   Varicose veins of bilateral lower extremities with other complications 04/18/2014   Varicose veins of lower extremities with ulcer and inflammation (HCC) 02/08/2014   Morbid obesity (HCC) 07/21/2013   HTN (hypertension) 07/21/2013   Dyspnea 07/21/2013   ACUTE BRONCHITIS 06/29/2010   Obstructive sleep apnea 06/02/2008   HYPERTENSION 06/02/2008    Past Medical History:  Diagnosis Date   Acute bronchitis    Allergy    Morbid obesity (HCC) 06/02/2008   Qualifier: Diagnosis of  By: Avis Boehringer CMA, Leigh     Obesity, unspecified    Obstructive sleep apnea (adult) (pediatric)    Sleep apnea    Tubular adenoma of colon 11/2008   Unspecified essential hypertension     Family History  Problem  Relation Age of Onset   Diabetes Mother        borderline   Pancreatic cancer Maternal Grandmother    Colon polyps Neg Hx    Esophageal cancer Neg Hx    Rectal cancer Neg Hx    Stomach cancer Neg Hx    Colon cancer Neg Hx    Past Surgical History:  Procedure Laterality Date   COLONOSCOPY WITH PROPOFOL  N/A 05/31/2014   Procedure: COLONOSCOPY WITH PROPOFOL ;  Surgeon: Claudette Cue,  MD;  Location: [facility];  Service: Endoscopy;  Laterality: N/A;   LASER ABLATION  03/28/2014, 05/16/2014   left leg, right leg   VARICOCELE EXCISION     Social History   Social History Narrative   Not on file   Immunization History  Administered Date(s) Administered   Influenza Split 05/06/2011, 05/05/2012, 05/05/2013, 05/05/2014, 04/05/2017, 05/09/2018   Influenza, High Dose Seasonal PF 06/25/2016, 06/24/2017, 07/06/2018, 07/08/2019, 07/10/2020   Influenza,inj,Quad PF,6+ Mos 05/06/2015   Influenza,inj,quad, With Preservative 04/07/2017   Influenza-Unspecified 05/05/2022   PFIZER(Purple Top)SARS-COV-2 Vaccination 10/28/2019, 11/22/2019, 06/18/2020, 07/10/2020     Objective: Vital Signs: BP 134/88 (BP Location: Left Arm, Patient Position: Sitting, Cuff Size: Large)   Pulse 66   Resp 15   Ht 6\' 2"  (1.88 m)   Wt (!) 342 lb (155.1 kg)   BMI 43.91 kg/m    Physical Exam Vitals and nursing note reviewed.  Constitutional:      Appearance: He is well-developed.  HENT:     Head: Normocephalic and atraumatic.  Eyes:     Conjunctiva/sclera: Conjunctivae normal.     Pupils: Pupils are equal, round, and reactive to light.  Cardiovascular:     Rate and Rhythm: Normal rate and regular rhythm.     Heart sounds: Normal heart sounds.  Pulmonary:     Effort: Pulmonary effort is normal.     Breath sounds: Normal breath sounds.  Abdominal:     General: Bowel sounds are normal.     Palpations: Abdomen is soft.  Musculoskeletal:     Cervical back: Normal range of motion and neck supple.  Skin:    General: Skin is warm and dry.     Capillary Refill: Capillary refill takes less than 2 seconds.  Neurological:     Mental Status: He is alert and oriented to person, place, and time.  Psychiatric:        Behavior: Behavior normal.      Musculoskeletal Exam: C-spine has good ROM. Left ROM of the left shoulder.  Right shoulder has good ROM.  Elbow joints, wrist joints, MCPs, PIPs, and  DIPs good ROM with no synovitis.  Complete fist formation bilaterally.  Knees joints have good ROM with no warmth or effusion.  Ankle joints have good ROM with no tenderness or joint swelling.    CDAI Exam: CDAI Score: -- Patient Global: --; Provider Global: -- Swollen: --; Tender: -- Joint Exam 11/25/2023   No joint exam has been documented for this visit   There is currently no information documented on the homunculus. Go to the Rheumatology activity and complete the homunculus joint exam.  Investigation: No additional findings.  Imaging: No results found.  Recent Labs: Lab Results  Component Value Date   WBC 6.6 08/27/2023   HGB 16.0 08/27/2023   PLT 167 08/27/2023   NA 138 08/27/2023   K 4.4 08/27/2023   CL 103 08/27/2023   CO2 27 08/27/2023   GLUCOSE 132 (H) 08/27/2023   BUN 14  08/27/2023   CREATININE 0.92 08/27/2023   BILITOT 0.6 08/27/2023   ALKPHOS 61 07/14/2018   AST 17 08/27/2023   ALT 17 08/27/2023   PROT 6.5 08/27/2023   ALBUMIN 4.3 07/14/2018   CALCIUM 9.9 08/27/2023   GFRAA  10/27/2010    >60        The eGFR has been calculated using the MDRD equation. This calculation has not been validated in all clinical situations. eGFR's persistently <60 mL/min signify possible Chronic Kidney Disease.    Speciality Comments: No specialty comments available.  Procedures:  No procedures performed Allergies: Other   Assessment / Plan:     Visit Diagnoses: Idiopathic chronic gout of multiple sites without tophus - Crystal proven gout-left knee aspiration performed Dr. Brunilda Capra.  Dx with gout 4 years ago. Uric acid was 9.3 on 12/10/22: He has not had any signs or symptoms of a gout flare.  No active inflammation.  He has clinically been doing well taking allopurinol  450 mg daily.  He is tolerating allopurinol  without any side effects and has not had any recent gaps in therapy.  He has not needed to take colchicine recently.  He is off of indomethacin  completely.  He  occasionally will take Aleve if he has increased discomfort in his feet after an increase in activity. His uric acid was 4.5 on 08/27/2023.  Plan to check uric acid level today.  He will remain on allopurinol  450 mg daily at this time.  He was curious if he will be able to reduce the dose of allopurinol  but I discussed the concern for developing a gout flare if his uric acid level does not remain in a desirable range. Dose adjustments may be made pending lab results. He will follow up in 6 months or sooner if needed.  - Plan: Uric acid  Medication monitoring encounter - Allopurinol  450 mg by mouth daily.  Uric acid 4.5 on 08/27/23.  Uric acid updated today.   CBC and CMP updated on 08/27/23.  Orders for CBC and CMP released today. - Plan: Uric acid, CBC with Differential/Platelet, Comprehensive metabolic panel with GFR  Elevated sed rate - ESR WNL-6 on 08/27/23.  Primary osteoarthritis of left shoulder - Dr. Madelaine Scarpa has a history of a rotator cuff tear.  He is considering proceeding with a reverse total shoulder replacement. In the future.   Trochanteric bursitis of right hip: Not currently symptomatic.   Chondromalacia patellae, left knee: No warmth or effusion noted.   Primary osteoarthritis of both feet: He is not experiencing any discomfort in his feet at this time.  He is wearing proper fitting shoes and compression stockings.   Other medical conditions are listed as follows:   Varicose veins of lower extremities with ulcer and inflammation (HCC)  Hepatic steatosis  Benign nodular prostatic hyperplasia with lower urinary tract symptoms - Patient requested to have PSA rechecked today. - Plan: PSA  Obstructive sleep apnea  Primary hypertension - BP was 134/88 today in the office.  Hypogonadism in male  History of colonic polyps  ED (erectile dysfunction) of organic origin  History of proctitis - Patient requested to have PSA rechecked today. - Plan: PSA  Orders: Orders  Placed This Encounter  Procedures   Uric acid   CBC with Differential/Platelet   Comprehensive metabolic panel with GFR   PSA   Meds ordered this encounter  Medications   allopurinol  (ZYLOPRIM ) 300 MG tablet    Sig: Take 1.5 tablets (450 mg total) by mouth daily.  Dispense:  135 tablet    Refill:  0    Please pre-cut tablets. Thanks!      Follow-Up Instructions: Return in about 6 months (around 05/26/2024) for Gout, Osteoarthritis.   Romayne Clubs, PA-C  Note - This record has been created using Dragon software.  Chart creation errors have been sought, but may not always  have been located. Such creation errors do not reflect on  the standard of medical care.

## 2023-11-25 ENCOUNTER — Ambulatory Visit: Payer: Self-pay | Attending: Physician Assistant | Admitting: Physician Assistant

## 2023-11-25 ENCOUNTER — Encounter: Payer: Self-pay | Admitting: Physician Assistant

## 2023-11-25 VITALS — BP 134/88 | HR 66 | Resp 15 | Ht 74.0 in | Wt 342.0 lb

## 2023-11-25 DIAGNOSIS — N529 Male erectile dysfunction, unspecified: Secondary | ICD-10-CM

## 2023-11-25 DIAGNOSIS — M1A09X0 Idiopathic chronic gout, multiple sites, without tophus (tophi): Secondary | ICD-10-CM

## 2023-11-25 DIAGNOSIS — G4733 Obstructive sleep apnea (adult) (pediatric): Secondary | ICD-10-CM

## 2023-11-25 DIAGNOSIS — M2242 Chondromalacia patellae, left knee: Secondary | ICD-10-CM

## 2023-11-25 DIAGNOSIS — Z5181 Encounter for therapeutic drug level monitoring: Secondary | ICD-10-CM

## 2023-11-25 DIAGNOSIS — R700 Elevated erythrocyte sedimentation rate: Secondary | ICD-10-CM

## 2023-11-25 DIAGNOSIS — K760 Fatty (change of) liver, not elsewhere classified: Secondary | ICD-10-CM

## 2023-11-25 DIAGNOSIS — M19012 Primary osteoarthritis, left shoulder: Secondary | ICD-10-CM | POA: Diagnosis not present

## 2023-11-25 DIAGNOSIS — Z8719 Personal history of other diseases of the digestive system: Secondary | ICD-10-CM

## 2023-11-25 DIAGNOSIS — N401 Enlarged prostate with lower urinary tract symptoms: Secondary | ICD-10-CM | POA: Diagnosis not present

## 2023-11-25 DIAGNOSIS — M7061 Trochanteric bursitis, right hip: Secondary | ICD-10-CM

## 2023-11-25 DIAGNOSIS — L97929 Non-pressure chronic ulcer of unspecified part of left lower leg with unspecified severity: Secondary | ICD-10-CM

## 2023-11-25 DIAGNOSIS — E291 Testicular hypofunction: Secondary | ICD-10-CM

## 2023-11-25 DIAGNOSIS — I83229 Varicose veins of left lower extremity with both ulcer of unspecified site and inflammation: Secondary | ICD-10-CM

## 2023-11-25 DIAGNOSIS — I10 Essential (primary) hypertension: Secondary | ICD-10-CM

## 2023-11-25 DIAGNOSIS — I83219 Varicose veins of right lower extremity with both ulcer of unspecified site and inflammation: Secondary | ICD-10-CM

## 2023-11-25 DIAGNOSIS — M19072 Primary osteoarthritis, left ankle and foot: Secondary | ICD-10-CM

## 2023-11-25 DIAGNOSIS — L97919 Non-pressure chronic ulcer of unspecified part of right lower leg with unspecified severity: Secondary | ICD-10-CM

## 2023-11-25 DIAGNOSIS — M19071 Primary osteoarthritis, right ankle and foot: Secondary | ICD-10-CM

## 2023-11-25 DIAGNOSIS — Z860100 Personal history of colon polyps, unspecified: Secondary | ICD-10-CM

## 2023-11-25 MED ORDER — ALLOPURINOL 300 MG PO TABS: 450.0000 mg | ORAL_TABLET | Freq: Every day | ORAL | 0 refills | Status: DC

## 2023-11-25 NOTE — Progress Notes (Signed)
 CBC WNL

## 2023-11-26 LAB — COMPREHENSIVE METABOLIC PANEL WITH GFR
AG Ratio: 1.6 (calc) (ref 1.0–2.5)
ALT: 17 U/L (ref 9–46)
AST: 18 U/L (ref 10–35)
Albumin: 4.1 g/dL (ref 3.6–5.1)
Alkaline phosphatase (APISO): 74 U/L (ref 35–144)
BUN: 15 mg/dL (ref 7–25)
CO2: 26 mmol/L (ref 20–32)
Calcium: 10.1 mg/dL (ref 8.6–10.3)
Chloride: 105 mmol/L (ref 98–110)
Creat: 0.8 mg/dL (ref 0.70–1.35)
Globulin: 2.5 g/dL (ref 1.9–3.7)
Glucose, Bld: 138 mg/dL — ABNORMAL HIGH (ref 65–99)
Potassium: 4.2 mmol/L (ref 3.5–5.3)
Sodium: 137 mmol/L (ref 135–146)
Total Bilirubin: 0.6 mg/dL (ref 0.2–1.2)
Total Protein: 6.6 g/dL (ref 6.1–8.1)
eGFR: 98 mL/min/{1.73_m2} (ref >=60)

## 2023-11-26 LAB — CBC WITH DIFFERENTIAL/PLATELET
Absolute Lymphocytes: 1254 {cells}/uL (ref 850–3900)
Absolute Monocytes: 510 {cells}/uL (ref 200–950)
Basophils Absolute: 60 {cells}/uL (ref 0–200)
Basophils Relative: 1 %
Eosinophils Absolute: 240 {cells}/uL (ref 15–500)
Eosinophils Relative: 4 %
HCT: 48 % (ref 38.5–50.0)
Hemoglobin: 16 g/dL (ref 13.2–17.1)
MCH: 31.1 pg (ref 27.0–33.0)
MCHC: 33.3 g/dL (ref 32.0–36.0)
MCV: 93.2 fL (ref 80.0–100.0)
MPV: 9.8 fL (ref 7.5–12.5)
Monocytes Relative: 8.5 %
Neutro Abs: 3936 {cells}/uL (ref 1500–7800)
Neutrophils Relative %: 65.6 %
Platelets: 169 10*3/uL (ref 140–400)
RBC: 5.15 10*6/uL (ref 4.20–5.80)
RDW: 12.7 % (ref 11.0–15.0)
Total Lymphocyte: 20.9 %
WBC: 6 10*3/uL (ref 3.8–10.8)

## 2023-11-26 LAB — URIC ACID: Uric Acid, Serum: 4.8 mg/dL (ref 4.0–8.0)

## 2023-11-26 LAB — PSA: PSA: 1.28 ng/mL (ref <=4.00)

## 2023-11-26 NOTE — Progress Notes (Signed)
 Glucose is elevated-138.  Rest of CMP WNL CBC WNL PSA WNL Uric acid-4.8--continue current treatment regimen.

## 2023-12-02 DIAGNOSIS — J3089 Other allergic rhinitis: Secondary | ICD-10-CM | POA: Diagnosis not present

## 2023-12-02 DIAGNOSIS — J301 Allergic rhinitis due to pollen: Secondary | ICD-10-CM | POA: Diagnosis not present

## 2023-12-02 DIAGNOSIS — J3081 Allergic rhinitis due to animal (cat) (dog) hair and dander: Secondary | ICD-10-CM | POA: Diagnosis not present

## 2023-12-03 ENCOUNTER — Encounter: Payer: Self-pay | Admitting: *Deleted

## 2023-12-09 DIAGNOSIS — M19012 Primary osteoarthritis, left shoulder: Secondary | ICD-10-CM | POA: Diagnosis not present

## 2023-12-17 ENCOUNTER — Telehealth: Payer: Self-pay | Admitting: Internal Medicine

## 2023-12-17 NOTE — Telephone Encounter (Signed)
 I added surgical clearance statement to my office note of 10/09/23

## 2023-12-17 NOTE — Telephone Encounter (Signed)
 Fax received from Dr. Marionette Sick with [facility] to perform a left TSA arthroplasty vs reverse TSA under choice anesthesia on patient.  Patient needs surgery clearance. Surgery is TBD. Patient was seen on 10/09/23. Office protocol is a risk assessment can be sent to surgeon if patient has been seen in 60 days or less.   Sending to Dr Linder Revere for risk assessment or recommendations if patient needs to be seen in office prior to surgical procedure.

## 2023-12-18 DIAGNOSIS — R972 Elevated prostate specific antigen [PSA]: Secondary | ICD-10-CM | POA: Diagnosis not present

## 2023-12-18 DIAGNOSIS — I10 Essential (primary) hypertension: Secondary | ICD-10-CM | POA: Diagnosis not present

## 2023-12-18 DIAGNOSIS — R0602 Shortness of breath: Secondary | ICD-10-CM | POA: Diagnosis not present

## 2023-12-18 DIAGNOSIS — M1A00X0 Idiopathic chronic gout, unspecified site, without tophus (tophi): Secondary | ICD-10-CM | POA: Diagnosis not present

## 2023-12-19 NOTE — Telephone Encounter (Signed)
 Copy of 10/09/23 ov note was faxed to [facility].

## 2024-01-01 DIAGNOSIS — J301 Allergic rhinitis due to pollen: Secondary | ICD-10-CM | POA: Diagnosis not present

## 2024-01-01 DIAGNOSIS — J3089 Other allergic rhinitis: Secondary | ICD-10-CM | POA: Diagnosis not present

## 2024-01-08 ENCOUNTER — Telehealth: Payer: Self-pay | Admitting: *Deleted

## 2024-01-08 NOTE — Telephone Encounter (Signed)
   Pre-operative Risk Assessment    Patient Name: Casey Roach  DOB: 1961-06-15 MRN: [id number]   Date of last office visit: 07/21/13 DR. SKAINS Date of next office visit: 01/22/24 DR. SKAINS NEW PT APPT    Request for Surgical Clearance    Procedure:  LEFT TSA ARTHROPLASTY vs REVERSE TSA  Date of Surgery:  Clearance TBD                                Surgeon:  DR. Marionette Sick Surgeon's Group or Practice Name:  [facility] Phone number:  [phone] MEGAN DAVIS Fax number:  [phone] OR [phone]   Type of Clearance Requested:   - Medical ; NONE INDICATED TO BE HELD PER FORM   Type of Anesthesia:  CHOICE   Additional requests/questions:    [name]   01/08/2024, 3:55 PM

## 2024-01-09 NOTE — Telephone Encounter (Signed)
   Name: Casey Roach  DOB: Dec 08, 1960  MRN: [id number]  Primary Cardiologist: None  Chart reviewed as part of pre-operative protocol coverage. The patient has an upcoming visit scheduled with Dr. Renna Cary on 01/22/2024 at which time clearance can be addressed in case there are any issues that would impact surgical recommendations.  Left TSA arthroplasty vs Reverse TSA is not scheduled until TBD as below. I added preop FYI to appointment note so that provider is aware to address at time of outpatient visit.  Per office protocol the cardiology provider should forward their finalized clearance decision and recommendations regarding antiplatelet therapy to the requesting party below.     I will route this message as FYI to requesting party and remove this message from the preop box as separate preop APP input not needed at this time.   Please call with any questions.  Jude Norton, NP  01/09/2024, 9:30 AM

## 2024-01-22 ENCOUNTER — Encounter: Payer: Self-pay | Admitting: Cardiology

## 2024-01-22 ENCOUNTER — Ambulatory Visit: Attending: Cardiology | Admitting: Cardiology

## 2024-01-22 VITALS — BP 126/80 | HR 67 | Ht 75.0 in | Wt 341.4 lb

## 2024-01-22 DIAGNOSIS — Z01810 Encounter for preprocedural cardiovascular examination: Secondary | ICD-10-CM

## 2024-01-22 DIAGNOSIS — R072 Precordial pain: Secondary | ICD-10-CM

## 2024-01-22 DIAGNOSIS — R0602 Shortness of breath: Secondary | ICD-10-CM

## 2024-01-22 DIAGNOSIS — I8390 Asymptomatic varicose veins of unspecified lower extremity: Secondary | ICD-10-CM | POA: Diagnosis not present

## 2024-01-22 MED ORDER — METOPROLOL TARTRATE 50 MG PO TABS: 50.0000 mg | ORAL_TABLET | ORAL | 0 refills | Status: DC

## 2024-01-22 NOTE — H&P (View-Only) (Signed)
 Cardiology Office Note:  .   Date:  01/22/2024  ID:  Casey Roach, DOB January 27, 1961, MRN [id number] PCP: Suan Elm, MD  [facility] Providers Cardiologist:  Dorothye Gathers, MD    History of Present Illness: .   Casey Roach is a 63 y.o. male Discussed the use of AI scribe software for clinical note transcription with the patient, who gave verbal consent to proceed.  History of Present Illness Casey Roach is a 63 year old male who presents for preoperative evaluation for left shoulder arthroplasty.  He is undergoing preoperative evaluation for left shoulder arthroplasty after conservative management of his shoulder condition. He has been experiencing increased tightness in his breathing over the past five to six weeks, which he describes as a 'little extra tightness.'  He has a history of a fall down nine carpeted steps approximately ten years ago, which may have resulted in a mild compression fracture in the mid-thoracic region. He has been experiencing a consistent slight clearing of clear phlegm and tightness, which he attributes to being out of shape. He reports a long period of inactivity due to gout, which ended in the fall, and decreased activity during the winter. Recently, he has been more active but has had to use an inhaler more frequently than before, which he previously used only during allergy season.  His breathing feels tighter after eating, particularly during lunch or dinner, which he describes as a sensation of the diaphragm tightening. No history of smoking or diabetes. His A1c was previously 6.1 but is now 5.5. He reports normal kidney function with a creatinine level of 0.8 and a hemoglobin level of 16.  Family history is notable for his mother being diagnosed with type 2 diabetes in her late sixties. No family history of heart problems. He has been trying to increase his physical activity to further improve his health metrics.     Studies  Reviewed: Aaron Aas   EKG Interpretation Date/Time:  Thursday January 22 2024 13:24:07 EDT Ventricular Rate:  67 PR Interval:  200 QRS Duration:  94 QT Interval:  388 QTC Calculation: 409 R Axis:   -24  Text Interpretation: Normal sinus rhythm Normal ECG No previous ECGs available Confirmed by Dorothye Gathers ([id number]) on 01/22/2024 1:35:43 PM    Results LABS Creatinine: 0.8 Hb: 16 A1c: 5.5  RADIOLOGY Chest X-ray: Heart size normal, no acute infiltrate, mild kyphosis, spondylosis, thoracic scoliosis, hyperinflation, no interval change (12/22/2023)  DIAGNOSTIC EKG: Normal (01/22/2024) Risk Assessment/Calculations:            Physical Exam:   VS:  BP 126/80 (BP Location: Left Arm, Patient Position: Sitting, Cuff Size: Large)   Pulse 67   Ht 6' 3 (1.905 m)   Wt (!) 341 lb 6.4 oz (154.9 kg)   SpO2 97%   BMI 42.67 kg/m    Wt Readings from Last 3 Encounters:  01/22/24 (!) 341 lb 6.4 oz (154.9 kg)  11/25/23 (!) 342 lb (155.1 kg)  10/09/23 (!) 342 lb 6.4 oz (155.3 kg)    GEN: Well nourished, well developed in no acute distress NECK: No JVD; No carotid bruits CARDIAC: RRR, no murmurs, no rubs, no gallops RESPIRATORY:  Clear to auscultation without rales, wheezing or rhonchi  ABDOMEN: Soft, non-tender, non-distended EXTREMITIES:  Varicose veins, No edema; No deformity   ASSESSMENT AND PLAN: .    Assessment and Plan Assessment & Plan Precordial chest pain Intermittent chest tightness and dyspnea over the last five to six  weeks. Differential diagnosis includes coronary artery disease, given the symptoms and the need for preoperative clearance for shoulder arthroplasty. EKG is normal. Discussed potential coronary artery stenosis or plaque as a cause of symptoms. A coronary CT scan is recommended to evaluate for significant blockages or stenosis in the coronary arteries, ensuring safety before shoulder surgery. - Order coronary CT scan to assess for coronary artery disease. - Schedule CT scan  using in-house equipment.  Vertebral fracture Mild compression fracture in the mid-thoracic region, likely from a fall ten years ago. No acute issues.  Gout Long period of inactivity due to gout flare ending in the fall.  Preop left shoulder surgery - We will go ahead and proceed with coronary CT scan given his chest tightness.         Dispo: We will follow-up with results of testing  Signed, Dorothye Gathers, MD

## 2024-01-22 NOTE — Patient Instructions (Signed)
 Medication Instructions:  The current medical regimen is effective;  continue present plan and medications.  *If you need a refill on your cardiac medications before your next appointment, please call your pharmacy*    Testing/Procedures:   Your cardiac CT will be scheduled at one of the below locations:   [facility] [address] [place], [place] [address] [phone]  OR  [address] Location   If scheduled at [facility], please arrive at the [facility] and Children's Entrance (Entrance C2) of [facility] 30 minutes prior to test start time. You can use the FREE valet parking offered at entrance C (encouraged to control the heart rate for the test)  Proceed to the [facility] Radiology Department (first floor) to check-in and test prep.  All radiology patients and guests should use entrance C2 at [facility], accessed from [facility], even though the hospital's physical address listed is [address]      Please follow these instructions carefully (unless otherwise directed):  An IV will be required for this test and Nitroglycerin will be given.  Hold all erectile dysfunction medications at least 3 days (72 hrs) prior to test. (Ie viagra, cialis, sildenafil, tadalafil, etc)   On the Night Before the Test: Be sure to Drink plenty of water. Do not consume any caffeinated/decaffeinated beverages or chocolate 12 hours prior to your test. Do not take any antihistamines 12 hours prior to your test. On the Day of the Test: Drink plenty of water until 1 hour prior to the test. Do not eat any food 1 hour prior to test. You may take your regular medications prior to the test.  Take metoprolol (Lopressor) two hours prior to test. If you take Furosemide/Hydrochlorothiazide/Spironolactone, please HOLD on the morning of the test. FEMALES- please wear underwire-free bra if available, avoid dresses & tight clothing        After the Test: Drink plenty of water. After receiving IV contrast, you may experience a mild flushed feeling. This is normal. On occasion, you may experience a mild rash up to 24 hours after the test. This is not dangerous. If this occurs, you can take Benadryl 25 mg and increase your fluid intake. If you experience trouble breathing, this can be serious. If it is severe call 911 IMMEDIATELY. If it is mild, please call our office. If you take any of these medications: Glipizide/Metformin, Avandament, Glucavance, please do not take 48 hours after completing test unless otherwise instructed.  We will call to schedule your test 2-4 weeks out understanding that some insurance companies will need an authorization prior to the service being performed.   For more information and frequently asked questions, please visit our website : [email or web address]  For non-scheduling related questions, please contact the cardiac imaging nurse navigator should you have any questions/concerns: Cardiac Imaging Nurse Navigators Direct Office Dial: [phone]   For scheduling needs, including cancellations and rescheduling, please call [place], [phone].   Follow-Up: At [facility], you and your health needs are our priority.  As part of our continuing mission to provide you with exceptional heart care, our providers are all part of one team.  This team includes your primary Cardiologist (physician) and Advanced Practice Providers or APPs (Physician Assistants and Nurse Practitioners) who all work together to provide you with the care you need, when you need it.  Your next appointment:   AS NEEDED  Provider:   Dorothye Gathers, MD  We recommend signing up for the patient portal called MyChart.  Sign up information is provided on this After Visit Summary.  MyChart is used to connect with patients for Virtual Visits (Telemedicine).  Patients are able to view lab/test results,  encounter notes, upcoming appointments, etc.  Non-urgent messages can be sent to your provider as well.   To learn more about what you can do with MyChart, go to [email or web address].

## 2024-01-22 NOTE — Progress Notes (Signed)
 Cardiology Office Note:  .   Date:  01/22/2024  ID:  Casey Roach, DOB January 27, 1961, MRN [id number] PCP: Suan Elm, MD  [facility] Providers Cardiologist:  Dorothye Gathers, MD    History of Present Illness: .   Casey Roach is a 63 y.o. male Discussed the use of AI scribe software for clinical note transcription with the patient, who gave verbal consent to proceed.  History of Present Illness Casey Roach is a 63 year old male who presents for preoperative evaluation for left shoulder arthroplasty.  He is undergoing preoperative evaluation for left shoulder arthroplasty after conservative management of his shoulder condition. He has been experiencing increased tightness in his breathing over the past five to six weeks, which he describes as a 'little extra tightness.'  He has a history of a fall down nine carpeted steps approximately ten years ago, which may have resulted in a mild compression fracture in the mid-thoracic region. He has been experiencing a consistent slight clearing of clear phlegm and tightness, which he attributes to being out of shape. He reports a long period of inactivity due to gout, which ended in the fall, and decreased activity during the winter. Recently, he has been more active but has had to use an inhaler more frequently than before, which he previously used only during allergy season.  His breathing feels tighter after eating, particularly during lunch or dinner, which he describes as a sensation of the diaphragm tightening. No history of smoking or diabetes. His A1c was previously 6.1 but is now 5.5. He reports normal kidney function with a creatinine level of 0.8 and a hemoglobin level of 16.  Family history is notable for his mother being diagnosed with type 2 diabetes in her late sixties. No family history of heart problems. He has been trying to increase his physical activity to further improve his health metrics.     Studies  Reviewed: Aaron Aas   EKG Interpretation Date/Time:  Thursday January 22 2024 13:24:07 EDT Ventricular Rate:  67 PR Interval:  200 QRS Duration:  94 QT Interval:  388 QTC Calculation: 409 R Axis:   -24  Text Interpretation: Normal sinus rhythm Normal ECG No previous ECGs available Confirmed by Dorothye Gathers ([id number]) on 01/22/2024 1:35:43 PM    Results LABS Creatinine: 0.8 Hb: 16 A1c: 5.5  RADIOLOGY Chest X-ray: Heart size normal, no acute infiltrate, mild kyphosis, spondylosis, thoracic scoliosis, hyperinflation, no interval change (12/22/2023)  DIAGNOSTIC EKG: Normal (01/22/2024) Risk Assessment/Calculations:            Physical Exam:   VS:  BP 126/80 (BP Location: Left Arm, Patient Position: Sitting, Cuff Size: Large)   Pulse 67   Ht 6' 3 (1.905 m)   Wt (!) 341 lb 6.4 oz (154.9 kg)   SpO2 97%   BMI 42.67 kg/m    Wt Readings from Last 3 Encounters:  01/22/24 (!) 341 lb 6.4 oz (154.9 kg)  11/25/23 (!) 342 lb (155.1 kg)  10/09/23 (!) 342 lb 6.4 oz (155.3 kg)    GEN: Well nourished, well developed in no acute distress NECK: No JVD; No carotid bruits CARDIAC: RRR, no murmurs, no rubs, no gallops RESPIRATORY:  Clear to auscultation without rales, wheezing or rhonchi  ABDOMEN: Soft, non-tender, non-distended EXTREMITIES:  Varicose veins, No edema; No deformity   ASSESSMENT AND PLAN: .    Assessment and Plan Assessment & Plan Precordial chest pain Intermittent chest tightness and dyspnea over the last five to six  weeks. Differential diagnosis includes coronary artery disease, given the symptoms and the need for preoperative clearance for shoulder arthroplasty. EKG is normal. Discussed potential coronary artery stenosis or plaque as a cause of symptoms. A coronary CT scan is recommended to evaluate for significant blockages or stenosis in the coronary arteries, ensuring safety before shoulder surgery. - Order coronary CT scan to assess for coronary artery disease. - Schedule CT scan  using in-house equipment.  Vertebral fracture Mild compression fracture in the mid-thoracic region, likely from a fall ten years ago. No acute issues.  Gout Long period of inactivity due to gout flare ending in the fall.  Preop left shoulder surgery - We will go ahead and proceed with coronary CT scan given his chest tightness.         Dispo: We will follow-up with results of testing  Signed, Dorothye Gathers, MD

## 2024-01-23 ENCOUNTER — Telehealth (HOSPITAL_COMMUNITY): Payer: Self-pay | Admitting: *Deleted

## 2024-01-23 NOTE — Telephone Encounter (Signed)
 Reaching out to patient to offer assistance regarding upcoming cardiac imaging study; pt verbalizes understanding of appt date/time, parking situation and where to check in, pre-test NPO status and medications ordered, and verified current allergies; name and call back number provided for further questions should they arise  Larey Brick RN Navigator Cardiac Imaging [facility] [phone] office [phone] cell

## 2024-01-26 ENCOUNTER — Ambulatory Visit (HOSPITAL_COMMUNITY)
Admission: RE | Admit: 2024-01-26 | Discharge: 2024-01-26 | Disposition: A | Discharge status: Home / Self Care | Source: Ambulatory Visit | Attending: Cardiology | Admitting: Cardiology

## 2024-01-26 ENCOUNTER — Ambulatory Visit (HOSPITAL_BASED_OUTPATIENT_CLINIC_OR_DEPARTMENT_OTHER)
Admission: RE | Admit: 2024-01-26 | Discharge: 2024-01-26 | Disposition: A | Discharge status: Home / Self Care | Source: Ambulatory Visit | Attending: Internal Medicine | Admitting: Internal Medicine

## 2024-01-26 ENCOUNTER — Ambulatory Visit (HOSPITAL_BASED_OUTPATIENT_CLINIC_OR_DEPARTMENT_OTHER): Payer: Self-pay | Admitting: Internal Medicine

## 2024-01-26 ENCOUNTER — Other Ambulatory Visit (HOSPITAL_BASED_OUTPATIENT_CLINIC_OR_DEPARTMENT_OTHER): Payer: Self-pay | Admitting: Internal Medicine

## 2024-01-26 ENCOUNTER — Ambulatory Visit: Payer: Self-pay | Admitting: Cardiology

## 2024-01-26 DIAGNOSIS — J3081 Allergic rhinitis due to animal (cat) (dog) hair and dander: Secondary | ICD-10-CM | POA: Diagnosis not present

## 2024-01-26 DIAGNOSIS — I2510 Atherosclerotic heart disease of native coronary artery without angina pectoris: Secondary | ICD-10-CM | POA: Insufficient documentation

## 2024-01-26 DIAGNOSIS — J3089 Other allergic rhinitis: Secondary | ICD-10-CM | POA: Diagnosis not present

## 2024-01-26 DIAGNOSIS — I2582 Chronic total occlusion of coronary artery: Secondary | ICD-10-CM | POA: Diagnosis not present

## 2024-01-26 DIAGNOSIS — J301 Allergic rhinitis due to pollen: Secondary | ICD-10-CM | POA: Diagnosis not present

## 2024-01-26 DIAGNOSIS — Z01810 Encounter for preprocedural cardiovascular examination: Secondary | ICD-10-CM

## 2024-01-26 DIAGNOSIS — R931 Abnormal findings on diagnostic imaging of heart and coronary circulation: Secondary | ICD-10-CM | POA: Diagnosis not present

## 2024-01-26 DIAGNOSIS — R072 Precordial pain: Secondary | ICD-10-CM | POA: Diagnosis not present

## 2024-01-26 MED ORDER — NITROGLYCERIN 0.4 MG SL SUBL
SUBLINGUAL_TABLET | SUBLINGUAL | Status: AC
Start: 2024-01-26 — End: 2024-01-26
  Filled 2024-01-26: qty 2

## 2024-01-26 MED ORDER — IOHEXOL 350 MG/ML SOLN
100.0000 mL | Freq: Once | INTRAVENOUS | Status: AC | PRN
  Administered 2024-01-26: 100 mL via INTRAVENOUS

## 2024-01-26 NOTE — Progress Notes (Signed)
CT sent for FFR 

## 2024-01-28 ENCOUNTER — Encounter: Payer: Self-pay | Admitting: Cardiology

## 2024-01-28 DIAGNOSIS — I480 Paroxysmal atrial fibrillation: Secondary | ICD-10-CM | POA: Diagnosis not present

## 2024-01-28 DIAGNOSIS — I771 Stricture of artery: Secondary | ICD-10-CM

## 2024-01-28 DIAGNOSIS — R072 Precordial pain: Secondary | ICD-10-CM

## 2024-01-28 NOTE — Telephone Encounter (Signed)
 Called the patient to follow up on the messages below. He reports that he has an appointment with his Primary Care Provider today- he wants him to look over the information before he moves forward with scheduling the Heart Catheterization. He also asked about going to beach for a long weekend. Informed him that would not be advisable at this time. He will call and/or MyChart message when/if he gets the green-light from his PCP to schedule the Heart Cath.     Please schedule left heart catheterization with possible percutaneous intervention.  See results of CT scan.  Abnormal coronary CT.  I have discussed with him. Thank you for your assistance Oneil Parchment, MD   CT CORONARY MORPH W/CTA COR W/SCORE W/CA W/CM &/OR WO/CM Parchment Oneil BROCKS, MD to [facility] Triage    01/26/24  4:39 PM Result Note There is severe 70 to 99% mid stenosis of the proximal LAD.  I called Mr. Cotton and discussed results.  Recommend that we go ahead and schedule a left heart catheterization with possible percutaneous intervention.  Risks and benefits have been discussed.   Shoulder surgery postponed.   Please call him to schedule cardiac catheterization. CT CORONARY MORPH W/CTA COR W/SCORE W/CA W/CM &/OR WO/CM Parchment Oneil BROCKS, MD to [facility]     01/26/24  4:39 PM There appears to be severe 70 to 99% mid stenosis of the proximal LAD.  I called Mr. Cotton and discussed results.  Recommend that we go ahead and schedule a left heart catheterization with possible percutaneous intervention.  Risks and benefits have been discussed.   Shoulder surgery postponed.   You will be receiving a call from our staff to schedule cardiac catheterization.

## 2024-01-28 NOTE — Telephone Encounter (Signed)
 Patient is calling for update. Please advise e

## 2024-01-29 ENCOUNTER — Encounter: Payer: Self-pay | Admitting: Cardiology

## 2024-01-29 MED ORDER — ASPIRIN 81 MG PO TBEC: 81.0000 mg | DELAYED_RELEASE_TABLET | Freq: Every day | ORAL | Status: DC

## 2024-01-29 NOTE — Telephone Encounter (Signed)
 RN called patient . Patient states the message has been handled earlier today by the scheduling team

## 2024-01-29 NOTE — Telephone Encounter (Signed)
 Spoke with pt and reviewed cath instructions.  Pt verbalizes understanding and thanked Charity fundraiser for the call.

## 2024-01-29 NOTE — Telephone Encounter (Signed)
 Pt calling for a update about the heart cath

## 2024-01-30 DIAGNOSIS — I771 Stricture of artery: Secondary | ICD-10-CM | POA: Diagnosis not present

## 2024-01-30 DIAGNOSIS — R072 Precordial pain: Secondary | ICD-10-CM | POA: Diagnosis not present

## 2024-01-30 LAB — CBC
Hematocrit: 49 % (ref 37.5–51.0)
Hemoglobin: 16 g/dL (ref 13.0–17.7)
MCH: 31.7 pg (ref 26.6–33.0)
MCHC: 32.7 g/dL (ref 31.5–35.7)
MCV: 97 fL (ref 79–97)
Platelets: 166 10*3/uL (ref 150–450)
RBC: 5.04 x10E6/uL (ref 4.14–5.80)
RDW: 13 % (ref 11.6–15.4)
WBC: 6.7 10*3/uL (ref 3.4–10.8)

## 2024-01-31 LAB — BASIC METABOLIC PANEL WITH GFR
BUN/Creatinine Ratio: 19 (ref 10–24)
BUN: 15 mg/dL (ref 8–27)
CO2: 21 mmol/L (ref 20–29)
Calcium: 9.9 mg/dL (ref 8.6–10.2)
Chloride: 102 mmol/L (ref 96–106)
Creatinine, Ser: 0.81 mg/dL (ref 0.76–1.27)
Glucose: 121 mg/dL — ABNORMAL HIGH (ref 70–99)
Potassium: 4.2 mmol/L (ref 3.5–5.2)
Sodium: 136 mmol/L (ref 134–144)
eGFR: 98 mL/min/{1.73_m2} (ref >59)

## 2024-02-02 ENCOUNTER — Encounter (HOSPITAL_COMMUNITY)
Admission: RE | Disposition: A | Discharge status: Home / Self Care | Payer: Self-pay | Source: Home / Self Care | Attending: Internal Medicine

## 2024-02-02 ENCOUNTER — Other Ambulatory Visit: Payer: Self-pay

## 2024-02-02 ENCOUNTER — Ambulatory Visit (HOSPITAL_COMMUNITY)
Admission: RE | Admit: 2024-02-02 | Discharge: 2024-02-02 | Disposition: A | Discharge status: Home / Self Care | Attending: Internal Medicine | Admitting: Internal Medicine

## 2024-02-02 DIAGNOSIS — I10 Essential (primary) hypertension: Secondary | ICD-10-CM | POA: Diagnosis not present

## 2024-02-02 DIAGNOSIS — Z79899 Other long term (current) drug therapy: Secondary | ICD-10-CM | POA: Diagnosis not present

## 2024-02-02 DIAGNOSIS — R0602 Shortness of breath: Secondary | ICD-10-CM | POA: Insufficient documentation

## 2024-02-02 DIAGNOSIS — Z7982 Long term (current) use of aspirin: Secondary | ICD-10-CM | POA: Insufficient documentation

## 2024-02-02 DIAGNOSIS — Z01810 Encounter for preprocedural cardiovascular examination: Secondary | ICD-10-CM | POA: Insufficient documentation

## 2024-02-02 DIAGNOSIS — I2510 Atherosclerotic heart disease of native coronary artery without angina pectoris: Secondary | ICD-10-CM | POA: Diagnosis not present

## 2024-02-02 DIAGNOSIS — R931 Abnormal findings on diagnostic imaging of heart and coronary circulation: Secondary | ICD-10-CM | POA: Diagnosis not present

## 2024-02-02 DIAGNOSIS — M109 Gout, unspecified: Secondary | ICD-10-CM | POA: Insufficient documentation

## 2024-02-02 DIAGNOSIS — R072 Precordial pain: Secondary | ICD-10-CM | POA: Diagnosis not present

## 2024-02-02 DIAGNOSIS — M4854XA Collapsed vertebra, not elsewhere classified, thoracic region, initial encounter for fracture: Secondary | ICD-10-CM | POA: Diagnosis not present

## 2024-02-02 HISTORY — PX: LEFT HEART CATH AND CORONARY ANGIOGRAPHY: CATH118249

## 2024-02-02 HISTORY — PX: CORONARY PRESSURE/FFR STUDY: CATH118243

## 2024-02-02 LAB — POCT ACTIVATED CLOTTING TIME: Activated Clotting Time: 273 s

## 2024-02-02 SURGERY — LEFT HEART CATH AND CORONARY ANGIOGRAPHY
Anesthesia: LOCAL

## 2024-02-02 MED ORDER — MIDAZOLAM HCL 2 MG/2ML IJ SOLN
INTRAMUSCULAR | Status: AC
  Filled 2024-02-02: qty 2

## 2024-02-02 MED ORDER — NITROGLYCERIN 1 MG/10 ML FOR IR/CATH LAB
INTRA_ARTERIAL | Status: AC
  Filled 2024-02-02: qty 10

## 2024-02-02 MED ORDER — SODIUM CHLORIDE 0.9 % WEIGHT BASED INFUSION: 1.0000 mL/kg/h | INTRAVENOUS | Status: DC

## 2024-02-02 MED ORDER — SODIUM CHLORIDE 0.9% FLUSH: 3.0000 mL | Freq: Two times a day (BID) | INTRAVENOUS | Status: DC

## 2024-02-02 MED ORDER — VERAPAMIL HCL 2.5 MG/ML IV SOLN
INTRAVENOUS | Status: AC
  Filled 2024-02-02: qty 2

## 2024-02-02 MED ORDER — HYDRALAZINE HCL 20 MG/ML IJ SOLN: 10.0000 mg | INTRAMUSCULAR | Status: DC | PRN

## 2024-02-02 MED ORDER — LIDOCAINE HCL (PF) 1 % IJ SOLN
INTRAMUSCULAR | Status: AC
  Filled 2024-02-02: qty 30

## 2024-02-02 MED ORDER — VERAPAMIL HCL 2.5 MG/ML IV SOLN
INTRAVENOUS | Status: DC | PRN
  Administered 2024-02-02: 10 mL via INTRA_ARTERIAL

## 2024-02-02 MED ORDER — SODIUM CHLORIDE 0.9 % IV SOLN: 250.0000 mL | INTRAVENOUS | Status: DC | PRN

## 2024-02-02 MED ORDER — SODIUM CHLORIDE 0.9 % WEIGHT BASED INFUSION: 3.0000 mL/kg/h | INTRAVENOUS | Status: DC

## 2024-02-02 MED ORDER — HEPARIN SODIUM (PORCINE) 1000 UNIT/ML IJ SOLN
INTRAMUSCULAR | Status: DC | PRN
  Administered 2024-02-02: 8000 [IU] via INTRAVENOUS
  Administered 2024-02-02: 5000 [IU] via INTRAVENOUS

## 2024-02-02 MED ORDER — ONDANSETRON HCL 4 MG/2ML IJ SOLN: 4.0000 mg | Freq: Four times a day (QID) | INTRAMUSCULAR | Status: DC | PRN

## 2024-02-02 MED ORDER — SODIUM CHLORIDE 0.9% FLUSH: 3.0000 mL | INTRAVENOUS | Status: DC | PRN

## 2024-02-02 MED ORDER — FENTANYL CITRATE (PF) 100 MCG/2ML IJ SOLN
INTRAMUSCULAR | Status: AC
  Filled 2024-02-02: qty 2

## 2024-02-02 MED ORDER — SODIUM CHLORIDE 0.9 % IV SOLN: INTRAVENOUS | Status: DC

## 2024-02-02 MED ORDER — HEPARIN (PORCINE) IN NACL 1000-0.9 UT/500ML-% IV SOLN
INTRAVENOUS | Status: DC | PRN
Start: 2024-02-02 — End: 2024-02-02
  Administered 2024-02-02 (×2): 500 mL

## 2024-02-02 MED ORDER — LABETALOL HCL 5 MG/ML IV SOLN
10.0000 mg | INTRAVENOUS | Status: DC | PRN
Start: 2024-02-02 — End: 2024-02-02

## 2024-02-02 MED ORDER — MIDAZOLAM HCL 2 MG/2ML IJ SOLN
INTRAMUSCULAR | Status: DC | PRN
  Administered 2024-02-02 (×2): 1 mg via INTRAVENOUS

## 2024-02-02 MED ORDER — ASPIRIN 81 MG PO CHEW: 81.0000 mg | CHEWABLE_TABLET | ORAL | Status: DC

## 2024-02-02 MED ORDER — LIDOCAINE HCL (PF) 1 % IJ SOLN
INTRAMUSCULAR | Status: DC | PRN
  Administered 2024-02-02: 2 mL

## 2024-02-02 MED ORDER — HEPARIN SODIUM (PORCINE) 1000 UNIT/ML IJ SOLN
INTRAMUSCULAR | Status: AC
  Filled 2024-02-02: qty 10

## 2024-02-02 MED ORDER — NITROGLYCERIN 1 MG/10 ML FOR IR/CATH LAB
INTRA_ARTERIAL | Status: DC | PRN
  Administered 2024-02-02: 100 ug via INTRACORONARY

## 2024-02-02 MED ORDER — ACETAMINOPHEN 325 MG PO TABS: 650.0000 mg | ORAL_TABLET | ORAL | Status: DC | PRN

## 2024-02-02 MED ORDER — ATORVASTATIN CALCIUM 20 MG PO TABS: 20.0000 mg | ORAL_TABLET | Freq: Every day | ORAL | 11 refills | Status: AC

## 2024-02-02 MED ORDER — FENTANYL CITRATE (PF) 100 MCG/2ML IJ SOLN
INTRAMUSCULAR | Status: DC | PRN
  Administered 2024-02-02 (×2): 25 ug via INTRAVENOUS

## 2024-02-02 SURGICAL SUPPLY — 10 items
CATH 5FR JL3.5 JR4 ANG PIG MP (CATHETERS) IMPLANT
CATH VISTA GUIDE 6FR XBLD 3.5 (CATHETERS) IMPLANT
DEVICE RAD COMP TR BAND LRG (VASCULAR PRODUCTS) IMPLANT
GLIDESHEATH SLEND SS 6F .021 (SHEATH) IMPLANT
GUIDEWIRE INQWIRE 1.5J.035X260 (WIRE) IMPLANT
GUIDEWIRE PRESSURE X 175 (WIRE) IMPLANT
KIT ESSENTIALS PG (KITS) IMPLANT
KIT PV (KITS) IMPLANT
PACK CARDIAC CATHETERIZATION (CUSTOM PROCEDURE TRAY) ×1 IMPLANT
TRANSDUCER W/MONITORING KIT (MISCELLANEOUS) IMPLANT

## 2024-02-02 NOTE — Interval H&P Note (Signed)
 History and Physical Interval Note:  02/02/2024 1:38 PM  Casey Roach  has presented today for surgery, with the diagnosis of coronary artery disease, shortness of breath, and atypical chest pain.  The various methods of treatment have been discussed with the patient and family. After consideration of risks, benefits and other options for treatment, the patient has consented to  Procedure(s): LEFT HEART CATH AND CORONARY ANGIOGRAPHY (N/A) as a surgical intervention.  The patient's history has been reviewed, patient examined, no change in status, stable for surgery.  I have reviewed the patient's chart and labs.  Questions were answered to the patient's satisfaction.    Cath Lab Visit (complete for each Cath Lab visit)  Clinical Evaluation Leading to the Procedure:   ACS: No.  Non-ACS:    Anginal Classification: CCS IV  Anti-ischemic medical therapy: No Therapy  Non-Invasive Test Results: Coronary CTA with significant proximal/mid LAD lesion -> intermediate/high risk  Prior CABG: No previous CABG         Casey Roach

## 2024-02-02 NOTE — Discharge Instructions (Signed)

## 2024-02-03 ENCOUNTER — Encounter (HOSPITAL_COMMUNITY): Payer: Self-pay | Admitting: Internal Medicine

## 2024-02-09 ENCOUNTER — Encounter: Payer: Self-pay | Admitting: Cardiology

## 2024-02-10 DIAGNOSIS — G4733 Obstructive sleep apnea (adult) (pediatric): Secondary | ICD-10-CM | POA: Diagnosis not present

## 2024-02-16 ENCOUNTER — Telehealth: Payer: Self-pay

## 2024-02-16 NOTE — Telephone Encounter (Signed)
   Pre-operative Risk Assessment    Patient Name: Casey Roach  DOB: [birth year] MRN: [id number]   Date of last office visit: 01/22/24 DR JEFFRIE Date of next office visit: NONE   Request for Surgical Clearance    Procedure:  LEFT TSA ARTHROPLASTY VS REVERSE TSA  Date of Surgery:  Clearance TBD                                Surgeon:  DR ELSPETH HER Surgeon's Group or Practice Name:  [facility] Phone number:  [phone] Fax number:  [phone]   Type of Clearance Requested:   - Medical  - Pharmacy:  Hold Aspirin  NOT INDICATED   Type of Anesthesia:  CHOICE   Additional requests/questions:    Signed, Shannara Winbush   02/16/2024, 2:22 PM

## 2024-02-17 ENCOUNTER — Encounter: Payer: Self-pay | Admitting: Cardiology

## 2024-02-17 DIAGNOSIS — E785 Hyperlipidemia, unspecified: Secondary | ICD-10-CM

## 2024-02-17 NOTE — Telephone Encounter (Signed)
   Patient Name: Casey Roach  DOB: 1960-09-24 MRN: [id number]  Primary Cardiologist: Oneil Parchment, MD  Chart reviewed as part of pre-operative protocol coverage. Patient was already seen 01/22/24 by Dr. Parchment for pre-op evaluation at which time a coronary CTA was ordered indicating severe MAC stenosis of the proximal LAD with recommendation for cardiac catheterization.  Patient underwent cardiac catheterization on 02/02/2024, noted to have mild to moderate, nonobstructive coronary artery disease including 50 to 60% proximal/mid LAD stenosis that was not hemodynamically significant.  Per office protocol, the provider who saw the patient should forward their finalized clearance decision to requesting party below upon completion of testing. I will route this message to Dr. Parchment so they are aware of where to fax final recommendation to.  Will route this message to surgeon as FYI. Will remove this message from the pre-op box as separate preop APP input is not needed.  Keyion Knack D Duwane Gewirtz, NP 02/17/2024, 11:16 AM

## 2024-02-18 NOTE — Telephone Encounter (Signed)
   Patient Name: Casey Roach  DOB: May 15, 1961 MRN: [id number]  Primary Cardiologist: Oneil Parchment, MD  Chart reviewed as part of pre-operative protocol coverage. Given past medical history and time since last visit, based on ACC/AHA guidelines, Casey Roach is at acceptable risk for the planned procedure without further cardiovascular testing.   Per Dr. Parchment on 02/18/2024 Okay to proceed with surgery.  Low to moderate overall cardiac risk. Oneil Parchment, MD  The patient was advised that if he develops new symptoms prior to surgery to contact our office to arrange for a follow-up visit, and he verbalized understanding.  I will route this recommendation to the requesting party via Epic fax function and remove from pre-op pool.  Please call with questions.  Lamarr Satterfield, NP 02/18/2024, 10:39 AM

## 2024-02-24 DIAGNOSIS — J301 Allergic rhinitis due to pollen: Secondary | ICD-10-CM | POA: Diagnosis not present

## 2024-02-24 DIAGNOSIS — J3081 Allergic rhinitis due to animal (cat) (dog) hair and dander: Secondary | ICD-10-CM | POA: Diagnosis not present

## 2024-02-24 DIAGNOSIS — J3089 Other allergic rhinitis: Secondary | ICD-10-CM | POA: Diagnosis not present

## 2024-02-24 DIAGNOSIS — M19012 Primary osteoarthritis, left shoulder: Secondary | ICD-10-CM | POA: Diagnosis not present

## 2024-03-02 ENCOUNTER — Other Ambulatory Visit: Payer: Self-pay | Admitting: Physician Assistant

## 2024-03-02 NOTE — Telephone Encounter (Signed)
 Last Fill: 11/25/2023  Labs: 01/30/2024 CBC and BMP WNL   Next Visit: 06/08/2024  Last Visit: 11/25/2023  DX: Idiopathic chronic gout of multiple sites without tophus   Current Dose per office note 11/25/2023: allopurinol  450 mg by mouth daily   Okay to refill Allopurinol ?

## 2024-03-12 DIAGNOSIS — G4733 Obstructive sleep apnea (adult) (pediatric): Secondary | ICD-10-CM | POA: Diagnosis not present

## 2024-03-15 ENCOUNTER — Emergency Department (HOSPITAL_COMMUNITY)
Admission: EM | Admit: 2024-03-15 | Discharge: 2024-03-15 | Disposition: A | Discharge status: Home / Self Care | Attending: Emergency Medicine | Admitting: Emergency Medicine

## 2024-03-15 ENCOUNTER — Other Ambulatory Visit: Payer: Self-pay

## 2024-03-15 DIAGNOSIS — I83892 Varicose veins of left lower extremities with other complications: Secondary | ICD-10-CM | POA: Diagnosis not present

## 2024-03-15 DIAGNOSIS — I83899 Varicose veins of unspecified lower extremities with other complications: Secondary | ICD-10-CM

## 2024-03-15 DIAGNOSIS — R58 Hemorrhage, not elsewhere classified: Secondary | ICD-10-CM | POA: Diagnosis not present

## 2024-03-15 DIAGNOSIS — S81812A Laceration without foreign body, left lower leg, initial encounter: Secondary | ICD-10-CM | POA: Diagnosis not present

## 2024-03-15 MED ORDER — LIDOCAINE-EPINEPHRINE (PF) 2 %-1:200000 IJ SOLN
20.0000 mL | Freq: Once | INTRAMUSCULAR | Status: AC
  Administered 2024-03-15 (×2): 20 mL
  Filled 2024-03-15: qty 20

## 2024-03-15 NOTE — ED Notes (Signed)
 No bleeding noted to wound to foot post suture placement.

## 2024-03-15 NOTE — ED Notes (Signed)
 Provider at bedside suturing wound. Pt tolerated without issues.

## 2024-03-15 NOTE — ED Provider Notes (Signed)
 [facility] Emergency Department Provider Note MRN:  [id number]  Arrival date & time: 03/16/24     Chief Complaint   Wound Check   History of Present Illness   Casey Roach is a 63 y.o. year-old male presents to the ED with chief complaint of bleeding varicose vein and left foot.  He states that he has a small ulcer to the left lateral ankle.  He has been managing this at home with some difficulty.  He states that today he noticed for some wetness on the floor, and realized that he was bleeding.  He denies being anticoagulated, but chart review shows that he is on aspirin .  He states that he initially had some dizziness when he first saw the blood, but feels fine now.  Denies any known injury.  Denies any other associated symptoms..  History provided by patient.   Review of Systems  Pertinent positive and negative review of systems noted in HPI.    Physical Exam   Vitals:   03/15/24 2300 03/15/24 2358  BP: 138/71 138/72  Pulse: 71 70  Resp: 14 18  Temp:  98.2 F (36.8 C)  SpO2: 97% 99%    CONSTITUTIONAL:  non toxic-appearing, NAD NEURO:  Alert and oriented x 3, CN 3-12 grossly intact EYES:  eyes equal and reactive ENT/NECK:  Supple, no stridor  CARDIO:  normal rate, regular rhythm, appears well-perfused  PULM:  No respiratory distress, CTAB GI/GU:  non-distended,  MSK/SPINE:  No gross deformities, no edema, moves all extremities  SKIN:  no rash, atraumatic, pin point high pressure bleeding varicose vein to the left lateral ankle with mild skin ulceration   *Additional and/or pertinent findings included in MDM below  Diagnostic and Interventional Summary    EKG Interpretation Date/Time:    Ventricular Rate:    PR Interval:    QRS Duration:    QT Interval:    QTC Calculation:   R Axis:      Text Interpretation:         Labs Reviewed - No data to display  No orders to display    Medications  lidocaine -EPINEPHrine  (XYLOCAINE   W/EPI) 2 %-1:200000 (PF) injection 20 mL (20 mLs Infiltration Given by Other 03/15/24 2228)     Procedures  /  Critical Care .Laceration Repair  Date/Time: 03/15/2024 10:53 PM  Performed by: Vicky Charleston, PA-C Authorized by: Vicky Charleston, PA-C   Consent:    Consent obtained:  Verbal   Consent given by:  Patient   Risks, benefits, and alternatives were discussed: yes     Risks discussed:  Infection, pain, poor cosmetic result, poor wound healing and vascular damage   Alternatives discussed:  No treatment Universal protocol:    Procedure explained and questions answered to patient or proxy's satisfaction: yes     Relevant documents present and verified: yes     Test results available: yes     Imaging studies available: yes     Required blood products, implants, devices, and special equipment available: yes     Site/side marked: yes     Immediately prior to procedure, a time out was called: yes     Patient identity confirmed:  Verbally with patient Anesthesia:    Anesthesia method:  Local infiltration   Local anesthetic:  Lidocaine  1% WITH epi Laceration details:    Location:  Leg   Leg location:  L lower leg   Length (cm):  0.1 Pre-procedure details:    Preparation:  Patient was prepped and draped in usual sterile fashion Exploration:    Hemostasis achieved with:  Tied off vessels   Wound extent: vascular damage     Contaminated: no   Treatment:    Area cleansed with:  Saline   Amount of cleaning:  Standard   Irrigation solution:  Sterile saline Skin repair:    Repair method:  Sutures   Suture size:  5-0   Suture material:  Prolene   Suture technique:  Figure eight   Number of sutures:  1 Approximation:    Approximation:  Close Repair type:    Repair type:  Simple Post-procedure details:    Dressing:  Antibiotic ointment and non-adherent dressing   Procedure completion:  Tolerated well, no immediate complications   ED Course and Medical Decision Making  I  have reviewed the triage vital signs, the nursing notes, and pertinent available records from the EMR.  Social Determinants Affecting Complexity of Care: Patient has no clinically significant social determinants affecting this chief complaint..   ED Course:    Medical Decision Making Patient here with bleeding varicose vein.  Patient had a small pinprick, which appears to have been from removing Band-Aid off of an ulcer on his left lateral ankle.  Patient had high-pressure venous bleeding from the small wound.  This was stopped with a figure-of-eight stitch.  Hemostasis was achieved.  Patient ambulates without any recurrence of bleeding.  He appears stable for discharge.  Will have patient follow-up with PCP for suture removal in 10 to 14 days.  Risk Prescription drug management.         Consultants: No consultations were needed in caring for this patient.   Treatment and Plan: I considered admission due to patient's initial presentation, but after considering the examination and diagnostic results, patient will not require admission and can be discharged with outpatient follow-up.    Final Clinical Impressions(s) / ED Diagnoses     ICD-10-CM   1. Bleeding from varicose vein  P16.100       ED Discharge Orders     None         Discharge Instructions Discussed with and Provided to Patient:   Discharge Instructions   None      Vicky Charleston, PA-C 03/16/24 0023    Ruthe Cornet, DO 03/16/24 2016

## 2024-03-15 NOTE — ED Notes (Signed)
 Pt ambulatory with steady gait to the bathroom. No bleeding noted from sutured wound.

## 2024-03-15 NOTE — ED Triage Notes (Signed)
 Pt coming home with a bleeding ulcer on his left outer foot. Wrapped with 2 olias bandages to control the bleeding. Pt denies dizziness or lightheadedness. Reports some initially when it was bleeding but feels better now.   EMS reports about on the floor.

## 2024-03-17 ENCOUNTER — Other Ambulatory Visit: Payer: Self-pay | Admitting: *Deleted

## 2024-03-17 ENCOUNTER — Telehealth: Payer: Self-pay | Admitting: *Deleted

## 2024-03-17 DIAGNOSIS — I83892 Varicose veins of left lower extremities with other complications: Secondary | ICD-10-CM

## 2024-03-17 NOTE — Telephone Encounter (Signed)
 Returning Casey Roach's earlier telephone call.  Casey Roach states that a small ulcer on his left ankle has opened again.  He also states that he had bleeding near the site of the ankle ulcer when he removed a bandage covering the wound.  Casey Roach states that he went to ED on 03-15-2024 and they put a stitch in to stop the bleeding and dressed the ankle ulcer.  Casey Roach is an established patient of Dr. Eliza that was last seen in June 2024.  Appointments made for venous reflux (left leg, order in EPIC) and VV FU with PA both on Friday, August 15 at 11:30 AM/12:30 PM.

## 2024-03-19 ENCOUNTER — Ambulatory Visit: Attending: Vascular Surgery | Admitting: Physician Assistant

## 2024-03-19 ENCOUNTER — Ambulatory Visit (HOSPITAL_COMMUNITY)
Admission: RE | Admit: 2024-03-19 | Discharge: 2024-03-19 | Disposition: A | Discharge status: Home / Self Care | Source: Ambulatory Visit | Attending: Vascular Surgery | Admitting: Vascular Surgery

## 2024-03-19 VITALS — BP 134/81 | HR 61 | Temp 97.7°F | Wt 339.3 lb

## 2024-03-19 DIAGNOSIS — M7989 Other specified soft tissue disorders: Secondary | ICD-10-CM

## 2024-03-19 DIAGNOSIS — I83892 Varicose veins of left lower extremities with other complications: Secondary | ICD-10-CM | POA: Insufficient documentation

## 2024-03-19 NOTE — Progress Notes (Signed)
 [facility]           [facility] [place]  History and Physical   Casey Roach is a 63 y.o. male who presents with bleeding varicosity.  He has hx of bilateral venous laser ablation in 2015 by Dr. Gerlean with hx of venous stasis ulcers.  He underwent repeat left leg laser ablation in 2017 also by Dr. Gerlean.  At the follow up, he did not recommend further treatment of the right leg.   He was seen by Dr. Eliza in 2024 and on duplex he did not have any significant superficial reflux to address.  He felt he may be good candidate for knee high pneumatic compression device.  He has worn compression in the past.   Pt comes in today for evaluation after he had bleeding from a venous stasis ulcer on Monday.  He states that he had a small venous stasis ulcer on the lateral ankle that he was using neosporin and a large band aid daily and then putting on his compression socks after that and it was getting better. He states he was sitting in his recliner on Monday and went to take the band aid off and it started bleeding.  He states he had to call 911 and he was taken to the ER where he had a figure of 8 stitch placed. It has not bled since.  He states he has swelling and it improves after he has been in bed at night.  He states he has lost some weight over the past couple of years.    He wears knee high 20-30 mmHg compression socks daily.   He does not have hx of DVT.  He does have skin color changes to his lower left leg.  He has family hx of venous disease with his father and grandfather and his sister has hx of spider veins.     The pt is on a statin for cholesterol management.  The pt is on a daily aspirin .   Other AC:  none The pt is on ARB for hypertension.   The pt is not on medication for diabetes.   Tobacco hx:  never  He does not have family hx of AAA.  Past Medical History:  Diagnosis Date   Acute bronchitis    Allergy    Morbid obesity (HCC) 06/02/2008    Qualifier: Diagnosis of  By: Latisha CMA, Leigh     Obesity, unspecified    Obstructive sleep apnea (adult) (pediatric)    Sleep apnea    Tubular adenoma of colon 11/2008   Unspecified essential hypertension     Past Surgical History:  Procedure Laterality Date   COLONOSCOPY WITH PROPOFOL  N/A 05/31/2014   Procedure: COLONOSCOPY WITH PROPOFOL ;  Surgeon: Lamar JONETTA Aho, MD;  Location: [facility];  Service: Endoscopy;  Laterality: N/A;   CORONARY PRESSURE/FFR STUDY N/A 02/02/2024   Procedure: CORONARY PRESSURE/FFR STUDY;  Surgeon: Mady Bruckner, MD;  Location: [facility];  Service: Cardiovascular;  Laterality: N/A;   LASER ABLATION  03/28/2014, 05/16/2014   left leg, right leg   LEFT HEART CATH AND CORONARY ANGIOGRAPHY N/A 02/02/2024   Procedure: LEFT HEART CATH AND CORONARY ANGIOGRAPHY;  Surgeon: Mady Bruckner, MD;  Location: [facility];  Service: Cardiovascular;  Laterality: N/A;   VARICOCELE EXCISION      Social History   Socioeconomic History   Marital status: Single    Spouse name: Not  on file   Number of children: 0   Years of education: Not on file   Highest education level: Not on file  Occupational History   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  Tobacco Use   Smoking status: Never    Passive exposure: Never   Smokeless tobacco: Never  Vaping Use   Vaping status: Never Used  Substance and Sexual Activity   Alcohol  use: Not Currently   Drug use: No   Sexual activity: Not on file  Other Topics Concern   Not on file  Social History Narrative   Not on file   Social Drivers of Health   Financial Resource Strain: Not on file  Food Insecurity: Low Risk  (09/23/2023)   Received from [facility]   Hunger Vital Sign    Within the past 12 months, you worried that your food would run out before you got money to buy more: Never true    Within the past 12 months, the food you bought just didn't last and you didn't have money to get  more. : Never true  Transportation Needs: No Transportation Needs (09/23/2023)   Received from [facility]    In the past 12 months, has lack of reliable transportation kept you from medical appointments, meetings, work or from getting things needed for daily living? : No  Physical Activity: Not on file  Stress: Not on file  Social Connections: Not on file  Intimate Partner Violence: Not on file     Family History  Problem Relation Age of Onset   Diabetes Mother        borderline   Pancreatic cancer Maternal Grandmother    Colon polyps Neg Hx    Esophageal cancer Neg Hx    Rectal cancer Neg Hx    Stomach cancer Neg Hx    Colon cancer Neg Hx     Current Outpatient Medications  Medication Sig Dispense Refill   allopurinol  (ZYLOPRIM ) 300 MG tablet Take 1&1/2 tablets (450 mg total) by mouth daily. 135 tablet 0   aspirin  EC 81 MG tablet Take 1 tablet (81 mg total) by mouth daily. Swallow whole.     atorvastatin  (LIPITOR) 20 MG tablet Take 1 tablet (20 mg total) by mouth daily. 30 tablet 11   budesonide -formoterol  (SYMBICORT ) 160-4.5 MCG/ACT inhaler Inhale 2 puffs into the lungs 2 (two) times daily as needed (shortness of breath).     colchicine 0.6 MG tablet Take 0.6 mg by mouth daily as needed (gout).     doxycycline (ADOXA) 100 MG tablet Take 100 mg by mouth as needed (rosacea).     EPINEPHrine  0.3 mg/0.3 mL IJ SOAJ injection Inject 0.3 mg into the muscle as needed for anaphylaxis.     ibuprofen (ADVIL) 200 MG tablet Take 200 mg by mouth every 6 (six) hours as needed for moderate pain (pain score 4-6).     NON FORMULARY Allergy Vaccine -Meg Whelan/once a month     NON FORMULARY CPAP 11     olmesartan (BENICAR) 20 MG tablet Take 20 mg by mouth daily.     sildenafil (VIAGRA) 100 MG tablet Take 100 mg by mouth as needed.     VENTOLIN  HFA 108 (90 Base) MCG/ACT inhaler USE 2 PUFFS EVERY 4 TO 6 HOURS AS NEEDED FOR COUGH/WHEEZING. 18 g 0   No current  facility-administered medications for this visit.    Allergies  Allergen Reactions   Other Shortness Of Breath and Other (See Comments)  Animal Dander, grass, pollen     REVIEW OF SYSTEMS:   [X]  denotes positive finding, [ ]  denotes negative finding Cardiac  Comments:  Chest pain or chest pressure:    Shortness of breath upon exertion:    Short of breath when lying flat:    Irregular heart rhythm:        Vascular    Pain in calf, thigh, or hip brought on by ambulation:    Pain in feet at night that wakes you up from your sleep:     Blood clot in your veins:    Leg swelling:  x See HPI      Pulmonary    Oxygen at home:    Productive cough:     Wheezing:         Neurologic    Sudden weakness in arms or legs:     Sudden numbness in arms or legs:     Sudden onset of difficulty speaking or slurred speech:    Temporary loss of vision in one eye:     Problems with dizziness:         Gastrointestinal    Blood in stool:     Vomited blood:         Genitourinary    Burning when urinating:     Blood in urine:        Psychiatric    Major depression:         Hematologic    Bleeding problems:    Problems with blood clotting too easily:        Skin    Rashes or ulcers:        Constitutional    Fever or chills:      PHYSICAL EXAMINATION:  Today's Vitals   03/19/24 1227  BP: 134/81  Pulse: 61  Temp: 97.7 F (36.5 C)  TempSrc: Temporal  Weight: (!) 339 lb 4.8 oz (153.9 kg)  PainSc: 0-No pain   Body mass index is 43.56 kg/m.   General:  WDWN in NAD; vital signs documented above Gait: Not observed HENT: WNL, normocephalic Pulmonary: normal non-labored breathing without wheezing Cardiac: regular HR; without carotid bruits Skin: without rashes; hemosiderin staining LLE Vascular Exam/Pulses: Left DP pulse is palpable; bilateral radial pulses are palpable Extremities: small ulcer on left lateral ankle as pictured   Left medial leg    Neurologic: A&O X  3;  moving all extremities equally Psychiatric:  The pt has Normal affect.   Non-Invasive Vascular Imaging:   Venous duplex on 03/19/2024: +--------------+---------+------+-----------+------------+--------+  LEFT         Reflux NoRefluxReflux TimeDiameter cmsComments                          Yes                                   +--------------+---------+------+-----------+------------+--------+  CFV                    yes   >1 second                       +--------------+---------+------+-----------+------------+--------+  FV mid        no                                              +--------------+---------+------+-----------+------------+--------+  FV dist       no                                              +--------------+---------+------+-----------+------------+--------+  GSV at SFJ              yes    >500 ms      0.92              +--------------+---------+------+-----------+------------+--------+  GSV prox thigh                                      NV        +--------------+---------+------+-----------+------------+--------+  GSV mid thigh                                       NV        +--------------+---------+------+-----------+------------+--------+  GSV dist thigh                                      NV        +--------------+---------+------+-----------+------------+--------+  GSV at knee                                         NV        +--------------+---------+------+-----------+------------+--------+  GSV prox calf no                            0.49              +--------------+---------+------+-----------+------------+--------+  GSV mid calf            yes    >500 ms      0.36              +--------------+---------+------+-----------+------------+--------+  SSV at [facility]              yes    >500 ms      0.53               +--------------+---------+------+-----------+------------+--------+  SSV prox calf no                            0.48              +--------------+---------+------+-----------+------------+--------+  SSV mid calf  no                            0.41              +--------------+---------+------+-----------+------------+--------+     Summary:  Left:  - No evidence of deep vein thrombosis seen in the left lower extremity, from the common femoral through the popliteal veins.  - No evidence of superficial venous thrombosis in the left lower extremity.  - Venous reflux is noted in the left common femoral vein.  - Venous reflux is noted in the left sapheno-femoral junction.  - Venous reflux is noted  in the left greater saphenous vein in the mid calf area. Unable to visualized proximally , consistent with history of laser ablation.  - Venous reflux is noted in the left short saphenous vein.   NOTE: Numerous varicosities throughout the left lower extremity.        Casey Roach is a 63 y.o. male who presents with: bleeding varicose vein in the left leg here for evaluation.     -pt has easily palpable left DP pedal pulse -in the left lower extremity, the pt does not have evidence of DVT.  Pt does have venous reflux in the deep CFV and the GSV at the [facility] as well as the mid calf and SSV at the [facility].  It was noted he had numerous varicosities throughout the LLE. -discussed with pt about continuing to wear knee high 20-30 mmHg compression stockings  -discussed the importance of leg elevation and how to elevate properly - pt is advised to elevate their legs and a diagram is given to them to demonstrate for pt to lay flat on their back with knees elevated and slightly bent with their feet higher than their knees, which puts their feet higher than their heart for 15 minutes per day.  If pt cannot lay flat, advised to lay as flat as possible.  Discussed with pt should he have another  bleeding event, this is the position he would get in and hold finger pressure for 15-30 minutes if able.  -pt is advised to continue as much walking as possible and avoid sitting or standing for long periods of time.  -discussed importance of weight loss and exercise and that water aerobics would also be beneficial.  -handout with recommendations given -pt will f/u in a couple of weeks with Inocente for removal of stitch and further evaluation for sclerotherapy of this area.  She did discuss this with him today and let him know insurance may or may not cover sclerotherapy.    Lucie Apt, [facility] [phone]  Clinic MD:  Sheree on call MD

## 2024-03-20 ENCOUNTER — Encounter (HOSPITAL_COMMUNITY): Payer: Self-pay

## 2024-03-20 ENCOUNTER — Ambulatory Visit (HOSPITAL_COMMUNITY)
Admission: EM | Admit: 2024-03-20 | Discharge: 2024-03-20 | Disposition: A | Discharge status: Home / Self Care | Attending: Emergency Medicine | Admitting: Emergency Medicine

## 2024-03-20 DIAGNOSIS — Z5189 Encounter for other specified aftercare: Secondary | ICD-10-CM

## 2024-03-20 NOTE — ED Provider Notes (Signed)
 [facility]    CSN: [id number] Arrival date & time: 03/20/24  1015      History   Chief Complaint Chief Complaint  Patient presents with   Wound Check    HPI Casey Roach is a 63 y.o. male.   Patient presents for wound check of stasis ulcer on on his left lower ankle.  Patient states that he chronically has an ulcer to his left lateral ankle, but on 8/11 that the area began to bleed.  Patient went to the ER due to uncontrollable bleeding from this area.  A figure-of-eight stitch was placed at this time.  Patient was seen at vascular yesterday and scheduled a follow-up appointment for 8/25 to have the stitches removed.    Patient states that last night when he took his compression stocking off he noticed some bleeding through the bandage that was placed during his vascular appointment and was concerned that it was bleeding heavily again. Patient states that he left the bandage on overnight as he was afraid to take the bandage off and it would start profusely bleeding again.  Patient wants to be sure that the area has not going to bleed significantly again.  The history is provided by the patient and medical records.  Wound Check    Past Medical History:  Diagnosis Date   Acute bronchitis    Allergy    Morbid obesity (HCC) 06/02/2008   Qualifier: Diagnosis of  By: Latisha CMA, Leigh     Obesity, unspecified    Obstructive sleep apnea (adult) (pediatric)    Sleep apnea    Tubular adenoma of colon 11/2008   Unspecified essential hypertension     Patient Active Problem List   Diagnosis Date Noted   Osteoarthritis 07/10/2018   Hepatic steatosis 09/15/2017   Pain in joint of right hip 08/25/2017   Varicose veins of left lower extremity with complications 02/20/2016   Varicose veins of bilateral lower extremities with other complications 01/16/2016   Insomnia 12/03/2015   History of colonic polyps 05/31/2014   Benign neoplasm of colon 05/31/2014   Varicose  veins without complication 05/23/2014   Benign nodular prostatic hyperplasia with lower urinary tract symptoms 05/04/2014   ED (erectile dysfunction) of organic origin 05/04/2014   Hypogonadism in male 05/04/2014   Varicose veins of bilateral lower extremities with other complications 04/18/2014   Varicose veins of lower extremities with ulcer and inflammation (HCC) 02/08/2014   Morbid obesity (HCC) 07/21/2013   HTN (hypertension) 07/21/2013   Dyspnea 07/21/2013   ACUTE BRONCHITIS 06/29/2010   Obstructive sleep apnea 06/02/2008   HYPERTENSION 06/02/2008    Past Surgical History:  Procedure Laterality Date   COLONOSCOPY WITH PROPOFOL  N/A 05/31/2014   Procedure: COLONOSCOPY WITH PROPOFOL ;  Surgeon: Lamar JONETTA Aho, MD;  Location: [facility];  Service: Endoscopy;  Laterality: N/A;   CORONARY PRESSURE/FFR STUDY N/A 02/02/2024   Procedure: CORONARY PRESSURE/FFR STUDY;  Surgeon: Mady Bruckner, MD;  [facility];  Service: Cardiovascular;  Laterality: N/A;   LASER ABLATION  03/28/2014, 05/16/2014   left leg, right leg   LEFT HEART CATH AND CORONARY ANGIOGRAPHY N/A 02/02/2024   Procedure: LEFT HEART CATH AND CORONARY ANGIOGRAPHY;  Surgeon: Mady Bruckner, MD;  [facility];  Service: Cardiovascular;  Laterality: N/A;   VARICOCELE EXCISION         Home Medications    Prior to Admission medications   Medication Sig Start Date End Date Taking? Authorizing Provider  allopurinol  (  ZYLOPRIM ) 300 MG tablet Take 1&1/2 tablets (450 mg total) by mouth daily. 03/02/24  Yes Cheryl Waddell HERO, PA-C  atorvastatin  (LIPITOR) 20 MG tablet Take 1 tablet (20 mg total) by mouth daily. 02/02/24 02/01/25 Yes End, Lonni, MD  budesonide -formoterol  (SYMBICORT ) 160-4.5 MCG/ACT inhaler Inhale 2 puffs into the lungs 2 (two) times daily as needed (shortness of breath).   Yes [provider]  colchicine 0.6 MG tablet Take 0.6 mg by mouth daily as needed (gout).   Yes  [provider]  doxycycline (ADOXA) 100 MG tablet Take 100 mg by mouth as needed (rosacea). 09/15/23  Yes [provider]  EPINEPHrine  0.3 mg/0.3 mL IJ SOAJ injection Inject 0.3 mg into the muscle as needed for anaphylaxis.   Yes [provider]  ibuprofen (ADVIL) 200 MG tablet Take 200 mg by mouth every 6 (six) hours as needed for moderate pain (pain score 4-6).   Yes [provider]  NON FORMULARY Allergy Vaccine -Meg Whelan/once a month   Yes [provider]  NON FORMULARY CPAP 11   Yes [provider]  olmesartan (BENICAR) 20 MG tablet Take 20 mg by mouth daily.   Yes [provider]  sildenafil (VIAGRA) 100 MG tablet Take 100 mg by mouth as needed. 12/08/23  Yes [provider]  VENTOLIN  HFA 108 (90 Base) MCG/ACT inhaler USE 2 PUFFS EVERY 4 TO 6 HOURS AS NEEDED FOR COUGH/WHEEZING. 05/28/18  Yes Young, Clinton D, MD  aspirin  EC 81 MG tablet Take 1 tablet (81 mg total) by mouth daily. Swallow whole. 01/29/24   Jeffrie Oneil BROCKS, MD    Family History Family History  Problem Relation Age of Onset   Diabetes Mother        borderline   Pancreatic cancer Maternal Grandmother    Colon polyps Neg Hx    Esophageal cancer Neg Hx    Rectal cancer Neg Hx    Stomach cancer Neg Hx    Colon cancer Neg Hx     Social History Social History   Tobacco Use   Smoking status: Never    Passive exposure: Never   Smokeless tobacco: Never  Vaping Use   Vaping status: Never Used  Substance Use Topics   Alcohol  use: Not Currently   Drug use: No     Allergies   Other   Review of Systems Review of Systems  Per HPI  Physical Exam Triage Vital Signs ED Triage Vitals  Encounter Vitals Group     BP 03/20/24 1059 138/81     Girls Systolic BP Percentile --      Girls Diastolic BP Percentile --      Boys Systolic BP Percentile --      Boys Diastolic BP Percentile --      Pulse Rate 03/20/24 1059 63     Resp 03/20/24 1059 18      Temp 03/20/24 1059 (!) 97.4 F (36.3 C)     Temp Source 03/20/24 1059 Oral     SpO2 03/20/24 1059 97 %     Weight 03/20/24 1058 (!) 339 lb 4.6 oz (153.9 kg)     Height 03/20/24 1058 6' 2 (1.88 m)     Head Circumference --      Peak Flow --      Pain Score 03/20/24 1058 0     Pain Loc --      Pain Education --      Exclude from Growth Chart --  No data found.  Updated Vital Signs BP 138/81 (BP Location: Left Arm)   Pulse 63   Temp (!) 97.4 F (36.3 C) (Oral)   Resp 18   Ht 6' 2 (1.88 m)   Wt (!) 339 lb 4.6 oz (153.9 kg)   SpO2 97%   BMI 43.56 kg/m   Visual Acuity Right Eye Distance:   Left Eye Distance:   Bilateral Distance:    Right Eye Near:   Left Eye Near:    Bilateral Near:     Physical Exam Vitals and nursing note reviewed.  Constitutional:      General: He is awake. He is not in acute distress.    Appearance: Normal appearance. He is well-developed and well-groomed. He is not ill-appearing.  Skin:    General: Skin is warm and dry.     Findings: Wound present.     Comments: Small stasis ulcer with figure 8 suture present noted to left lateral ankle.  Bleeding controlled at this time.  Neurological:     Mental Status: He is alert.  Psychiatric:        Behavior: Behavior is cooperative.      UC Treatments / Results  Labs (all labs ordered are listed, but only abnormal results are displayed) Labs Reviewed - No data to display  EKG   Radiology VAS US  LOWER EXTREMITY VENOUS REFLUX Result Date: 03/19/2024  Lower Venous Reflux Study Patient Name:  Casey Roach  Date of Exam:   03/19/2024 Medical Rec #: [id number]         Accession #:    [id number] Date of Birth: February 04, 1961         Patient Gender: M Patient Age:   40 years Exam Location:  [address] Procedure:      VAS US  LOWER EXTREMITY VENOUS REFLUX Referring Phys: PENNE COL[place]ADO --------------------------------------------------------------------------------  Indications: Varicosities, and  bleeding varicosity left calf. Other Indications: Left accessory saphenous vein laser ablation 07/01/16,                    bilateral great saphenous vein lasaer ablations 2015. Patent                    in ER recently for bleeding varicosity with sutures. Performing Technologist: Devere Dark RVT  Examination Guidelines: A complete evaluation includes B-mode imaging, spectral Doppler, color Doppler, and power Doppler as needed of all accessible portions of each vessel. Bilateral testing is considered an integral part of a complete examination. Limited examinations for reoccurring indications may be performed as noted. The reflux portion of the exam is performed with the patient in reverse Trendelenburg. Significant venous reflux is defined as >500 ms in the superficial venous system, and >1 second in the deep venous system.  +--------------+---------+------+-----------+------------+--------+ LEFT          Reflux NoRefluxReflux TimeDiameter cmsComments                         Yes                                  +--------------+---------+------+-----------+------------+--------+ CFV                     yes   >1 second                      +--------------+---------+------+-----------+------------+--------+  FV mid        no                                             +--------------+---------+------+-----------+------------+--------+ FV dist       no                                             +--------------+---------+------+-----------+------------+--------+ GSV at [facility]              yes    >500 ms      0.92             +--------------+---------+------+-----------+------------+--------+ GSV prox thigh                                      NV       +--------------+---------+------+-----------+------------+--------+ GSV mid thigh                                       NV       +--------------+---------+------+-----------+------------+--------+ GSV dist thigh                                       NV       +--------------+---------+------+-----------+------------+--------+ GSV at knee                                         NV       +--------------+---------+------+-----------+------------+--------+ GSV prox calf no                            0.49             +--------------+---------+------+-----------+------------+--------+ GSV mid calf            yes    >500 ms      0.36             +--------------+---------+------+-----------+------------+--------+ SSV at [facility]              yes    >500 ms      0.53             +--------------+---------+------+-----------+------------+--------+ SSV prox calf no                            0.48             +--------------+---------+------+-----------+------------+--------+ SSV mid calf  no                            0.41             +--------------+---------+------+-----------+------------+--------+   Summary: Left: - No evidence of deep vein thrombosis seen in the left lower extremity, from the common femoral through the popliteal veins. - No evidence of superficial venous thrombosis in the left lower extremity. -  Venous reflux is noted in the left common femoral vein. - Venous reflux is noted in the left sapheno-femoral junction. - Venous reflux is noted in the left greater saphenous vein in the mid calf area. Unable to visualized proximally , consistent with history of laser ablation. - Venous reflux is noted in the left short saphenous vein. NOTE: Numerous varicosities throughout the left lower extremity.  *See table(s) above for measurements and observations. Electronically signed by Gaile New MD on 03/19/2024 at 5:02:40 PM.    Final     Procedures Procedures (including critical care time)  Medications Ordered in UC Medications - No data to display  Initial Impression / Assessment and Plan / UC Course  I have reviewed the triage vital signs and the nursing notes.  Pertinent labs &  imaging results that were available during my care of the patient were reviewed by me and considered in my medical decision making (see chart for details).     Patient is overall well-appearing.  Vitals are stable.  Bleeding controlled at this time.  Undressed and redressed the area.  Discussed follow-up and return precautions. Final Clinical Impressions(s) / UC Diagnoses   Final diagnoses:  Visit for wound check     Discharge Instructions      Keep the area clean dry and covered. Keep your follow-up appointment with vascular on 8/25 to have the stitches removed and to reevaluate this area. Return here as needed.   ED Prescriptions   None    PDMP not reviewed this encounter.   Johnie Flaming A, NP 03/20/24 1151

## 2024-03-20 NOTE — ED Triage Notes (Signed)
 Patient presenting with a stasis ulcer on the left lower ankle onset 1 week ago. States there was a suture placed on it in the ER. States he noticed some bleeding last night.  Prescriptions or OTC medications tried: No

## 2024-03-20 NOTE — Discharge Instructions (Addendum)
 Keep the area clean dry and covered. Keep your follow-up appointment with vascular on 8/25 to have the stitches removed and to reevaluate this area. Return here as needed.

## 2024-03-24 ENCOUNTER — Ambulatory Visit: Payer: Self-pay | Admitting: Cardiology

## 2024-03-24 DIAGNOSIS — J301 Allergic rhinitis due to pollen: Secondary | ICD-10-CM | POA: Diagnosis not present

## 2024-03-24 DIAGNOSIS — J3089 Other allergic rhinitis: Secondary | ICD-10-CM | POA: Diagnosis not present

## 2024-03-24 DIAGNOSIS — J3081 Allergic rhinitis due to animal (cat) (dog) hair and dander: Secondary | ICD-10-CM | POA: Diagnosis not present

## 2024-03-24 LAB — LIPID PANEL
Chol/HDL Ratio: 2.2 ratio (ref 0.0–5.0)
Cholesterol, Total: 93 mg/dL — ABNORMAL LOW (ref 100–199)
HDL: 43 mg/dL (ref >39)
LDL Chol Calc (NIH): 36 mg/dL (ref 0–99)
Triglycerides: 59 mg/dL (ref 0–149)
VLDL Cholesterol Cal: 14 mg/dL (ref 5–40)

## 2024-03-25 NOTE — H&P (Signed)
 Patient's anticipated LOS is less than 2 midnights, meeting these requirements: - Younger than 84 - Lives within 1 hour of care - Has a competent adult at home to recover with post-op recover - NO history of  - Chronic pain requiring opiods  - Diabetes  - Coronary Artery Disease  - Heart failure  - Heart attack  - Stroke  - DVT/VTE  - Cardiac arrhythmia  - Respiratory Failure/COPD  - Renal failure  - Anemia  - Advanced Liver disease     Casey Roach is an 63 y.o. male.    Chief Complaint: left shoulder pain  HPI: Pt is a 63 y.o. male complaining of left shoulder pain for multiple years. Pain had continually increased since the beginning. X-rays in the clinic show end-stage arthritic changes of the left shoulder. Pt has tried various conservative treatments which have failed to alleviate their symptoms, including injections and therapy. Various options are discussed with the patient. Risks, benefits and expectations were discussed with the patient. Patient understand the risks, benefits and expectations and wishes to proceed with surgery.   PCP:  Shepard Ade, MD  D/C Plans: Home  PMH: Past Medical History:  Diagnosis Date   Acute bronchitis    Allergy    Morbid obesity (HCC) 06/02/2008   Qualifier: Diagnosis of  By: Latisha CMA, Leigh     Obesity, unspecified    Obstructive sleep apnea (adult) (pediatric)    Sleep apnea    Tubular adenoma of colon 11/2008   Unspecified essential hypertension     PSH: Past Surgical History:  Procedure Laterality Date   COLONOSCOPY WITH PROPOFOL  N/A 05/31/2014   Procedure: COLONOSCOPY WITH PROPOFOL ;  Surgeon: Lamar JONETTA Aho, MD;  Location: [facility];  Service: Endoscopy;  Laterality: N/A;   CORONARY PRESSURE/FFR STUDY N/A 02/02/2024   Procedure: CORONARY PRESSURE/FFR STUDY;  Surgeon: Mady Bruckner, MD;  [facility];  Service: Cardiovascular;  Laterality: N/A;   LASER ABLATION  03/28/2014, 05/16/2014    left leg, right leg   LEFT HEART CATH AND CORONARY ANGIOGRAPHY N/A 02/02/2024   Procedure: LEFT HEART CATH AND CORONARY ANGIOGRAPHY;  Surgeon: Mady Bruckner, MD;  [facility];  Service: Cardiovascular;  Laterality: N/A;   VARICOCELE EXCISION      Social History:  reports that he has never smoked. He has never been exposed to tobacco smoke. He has never used smokeless tobacco. He reports that he does not currently use alcohol . He reports that he does not use drugs. BMI: Estimated body mass index is 43.56 kg/m as calculated from the following:   Height as of 03/20/24: 6' 2 (1.88 m).   Weight as of 03/20/24: 153.9 kg.  Lab Results  Component Value Date   ALBUMIN 4.3 07/14/2018   Diabetes: Patient does not have a diagnosis of diabetes.     Smoking Status:   reports that he has never smoked. He has never been exposed to tobacco smoke. He has never used smokeless tobacco.    Allergies:  Allergies  Allergen Reactions   Other Shortness Of Breath and Other (See Comments)    Animal Dander, grass, pollen     Medications: No current facility-administered medications for this encounter.   Current Outpatient Medications  Medication Sig Dispense Refill   allopurinol  (ZYLOPRIM ) 300 MG tablet Take 1&1/2 tablets (450 mg total) by mouth daily. 135 tablet 0   aspirin  EC 81 MG tablet Take 1 tablet (81 mg total) by mouth daily. Swallow whole.  atorvastatin  (LIPITOR) 20 MG tablet Take 1 tablet (20 mg total) by mouth daily. 30 tablet 11   budesonide -formoterol  (SYMBICORT ) 160-4.5 MCG/ACT inhaler Inhale 2 puffs into the lungs 2 (two) times daily as needed (shortness of breath).     colchicine 0.6 MG tablet Take 0.6 mg by mouth daily as needed (gout).     doxycycline (ADOXA) 100 MG tablet Take 100 mg by mouth as needed (rosacea).     EPINEPHrine  0.3 mg/0.3 mL IJ SOAJ injection Inject 0.3 mg into the muscle as needed for anaphylaxis.     ibuprofen (ADVIL) 200 MG tablet Take 200  mg by mouth every 6 (six) hours as needed for moderate pain (pain score 4-6).     NON FORMULARY Allergy Vaccine -Meg Whelan/once a month     NON FORMULARY CPAP 11     olmesartan (BENICAR) 20 MG tablet Take 20 mg by mouth daily.     sildenafil (VIAGRA) 100 MG tablet Take 100 mg by mouth as needed.     VENTOLIN  HFA 108 (90 Base) MCG/ACT inhaler USE 2 PUFFS EVERY 4 TO 6 HOURS AS NEEDED FOR COUGH/WHEEZING. 18 g 0    Results for orders placed or performed in visit on 02/17/24 (from the past 48 hours)  Lipid panel     Status: Abnormal   Collection Time: 03/23/24  9:31 AM  Result Value Ref Range   Cholesterol, Total 93 (L) 100 - 199 mg/dL   Triglycerides 59 0 - 149 mg/dL   HDL 43 >60 mg/dL   VLDL Cholesterol Cal 14 5 - 40 mg/dL   LDL Chol Calc (NIH) 36 0 - 99 mg/dL   Chol/HDL Ratio 2.2 0.0 - 5.0 ratio    Comment:                                   T. Chol/HDL Ratio                                             Men  Women                               1/2 Avg.Risk  3.4    3.3                                   Avg.Risk  5.0    4.4                                2X Avg.Risk  9.6    7.1                                3X Avg.Risk 23.4   11.0    No results found.  ROS: Pain with rom of the left upper extremity  Physical Exam: Alert and oriented 63 y.o. male in no acute distress Cranial nerves 2-12 intact Cervical spine: full rom with no tenderness, nv intact distally Chest: active breath sounds bilaterally, no wheeze rhonchi or rales Heart: regular rate and rhythm, no murmur Abd: non tender non distended  with active bowel sounds Hip is stable with rom  Left shoulder painful and weak rom Nv intact distally  Assessment/Plan Assessment: left shoulder cuff arthropathy  Plan:  Patient will undergo a left reverse total shoulder by Dr. Kay at [place] Risks benefits and expectations were discussed with the patient. Patient understand risks, benefits and expectations and wishes to  proceed. Preoperative templating of the joint replacement has been completed, documented, and submitted to the Operating Room personnel in order to optimize intra-operative equipment management.   Arvella Fireman PA-C, MPAS [facility] is now [employer] [address], [place], [place] [address] Phone: [phone] [email or web address] Facebook  [employer]

## 2024-03-29 ENCOUNTER — Ambulatory Visit: Attending: Surgery | Admitting: Physician Assistant

## 2024-03-29 ENCOUNTER — Encounter: Payer: Self-pay | Admitting: Physician Assistant

## 2024-03-29 DIAGNOSIS — I83229 Varicose veins of left lower extremity with both ulcer of unspecified site and inflammation: Secondary | ICD-10-CM | POA: Diagnosis not present

## 2024-03-29 DIAGNOSIS — L97929 Non-pressure chronic ulcer of unspecified part of left lower leg with unspecified severity: Secondary | ICD-10-CM

## 2024-03-29 DIAGNOSIS — L97919 Non-pressure chronic ulcer of unspecified part of right lower leg with unspecified severity: Secondary | ICD-10-CM | POA: Diagnosis not present

## 2024-03-29 DIAGNOSIS — I872 Venous insufficiency (chronic) (peripheral): Secondary | ICD-10-CM

## 2024-03-29 DIAGNOSIS — I83219 Varicose veins of right lower extremity with both ulcer of unspecified site and inflammation: Secondary | ICD-10-CM | POA: Diagnosis not present

## 2024-03-29 NOTE — Progress Notes (Signed)
 Office Note     CC:  follow up Requesting Provider:  Shepard Ade, MD  HPI: Casey Roach is a 63 y.o. (1961/07/06) male who presents for follow up after bleeding varicosity. He has hx of bilateral venous laser ablation in 2015 by Dr. Gerlean with hx of venous stasis ulcers. He was last seen on 03/19/24 at which time he had no further bleeding issues. He had reflux study showing reflux in the deep CFV and the GSV at the [facility] as well as the mid calf and SSV at the [facility].  It was noted he had numerous varicosities throughout the LLE. Patient encouraged to wear knee high 20-30 mmHg compression stockings, elevate and avoid prolonged sitting or standing.   He returns today to have suture removed from left leg and also discuss sclerotherapy. He has been cleaning the area daily with mild soap and water. He says he has been allowing it to air dry and then placing Telfa and Band-Aid over to keep area protected. He has noticed a small amount of drainage on dressing daily. No further active bleeding. He has been elevating, walking more and wearing his compression stockings. He explains that he is getting ready to have left shoulder surgery on 9/10 and would like to wait until after his recovery from that to have any treatment on his legs. He is however very concerned about recurrent bleeding events.   He has history of left leg laser ablation in 2017 by Dr. Gerlean. He does not have hx of DVT. He does have skin color changes to his lower left leg. He has family hx of venous disease with his father and grandfather and his sister has hx of spider veins.    Past Medical History:  Diagnosis Date   Acute bronchitis    Allergy    Morbid obesity (HCC) 06/02/2008   Qualifier: Diagnosis of  By: Latisha CMA, Leigh     Obesity, unspecified    Obstructive sleep apnea (adult) (pediatric)    Sleep apnea    Tubular adenoma of colon 11/2008   Unspecified essential hypertension     Past Surgical History:  Procedure  Laterality Date   COLONOSCOPY WITH PROPOFOL  N/A 05/31/2014   Procedure: COLONOSCOPY WITH PROPOFOL ;  Surgeon: Lamar JONETTA Aho, MD;  Location: [facility];  Service: Endoscopy;  Laterality: N/A;   CORONARY PRESSURE/FFR STUDY N/A 02/02/2024   Procedure: CORONARY PRESSURE/FFR STUDY;  Surgeon: Mady Bruckner, MD;  Location: [facility] INVAS[facility];  Service: Cardiovascular;  Laterality: N/A;   LASER ABLATION  03/28/2014, 05/16/2014   left leg, right leg   LEFT HEART CATH AND CORONARY ANGIOGRAPHY N/A 02/02/2024   Procedure: LEFT HEART CATH AND CORONARY ANGIOGRAPHY;  Surgeon: Mady Bruckner, MD;  Location: [facility] INVAS[facility] CV [facility];  Service: Cardiovascular;  Laterality: N/A;   VARICOCELE EXCISION      Social History   Socioeconomic History   Marital status: Single    Spouse name: Not on file   Number of children: 0   Years of education: Not on file   Highest education level: Not on file  Occupational History   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  Tobacco Use   Smoking status: Never    Passive exposure: Never   Smokeless tobacco: Never  Vaping Use   Vaping status: Never Used  Substance and Sexual Activity   Alcohol  use: Not Currently   Drug use: No   Sexual activity: Not on file  Other Topics Concern   Not on file  Social History Narrative   Not on file   Social Drivers of Health   Financial Resource Strain: Not on file  Food Insecurity: Low Risk  (09/23/2023)   Received from [facility]   Hunger Vital Sign    Within the past 12 months, you worried that your food would run out before you got money to buy more: Never true    Within the past 12 months, the food you bought just didn't last and you didn't have money to get more. : Never true  Transportation Needs: No Transportation Needs (09/23/2023)   Received from [facility]    In the past 12 months, has lack of reliable transportation kept you from medical appointments, meetings, work or from getting  things needed for daily living? : No  Physical Activity: Not on file  Stress: Not on file  Social Connections: Not on file  Intimate Partner Violence: Not on file    Family History  Problem Relation Age of Onset   Diabetes Mother        borderline   Pancreatic cancer Maternal Grandmother    Colon polyps Neg Hx    Esophageal cancer Neg Hx    Rectal cancer Neg Hx    Stomach cancer Neg Hx    Colon cancer Neg Hx     Current Outpatient Medications  Medication Sig Dispense Refill   allopurinol  (ZYLOPRIM ) 300 MG tablet Take 1&1/2 tablets (450 mg total) by mouth daily. (Patient taking differently: Take 150 mg by mouth in the morning, at noon, and at bedtime.) 135 tablet 0   atorvastatin  (LIPITOR) 20 MG tablet Take 1 tablet (20 mg total) by mouth daily. (Patient taking differently: Take 20 mg by mouth every evening.) 30 tablet 11   budesonide -formoterol  (SYMBICORT ) 160-4.5 MCG/ACT inhaler Inhale 2 puffs into the lungs 2 (two) times daily as needed (respiratory issues.).     colchicine 0.6 MG tablet Take 0.6 mg by mouth daily as needed (gout).     doxycycline (ADOXA) 100 MG tablet Take 100 mg by mouth daily as needed (rosacea).     EPINEPHrine  0.3 mg/0.3 mL IJ SOAJ injection Inject 0.3 mg into the muscle as needed for anaphylaxis.     ibuprofen (ADVIL) 200 MG tablet Take 200 mg by mouth every 8 (eight) hours as needed (pain.).     naproxen sodium (ALEVE) 220 MG tablet Take 220 mg by mouth daily as needed (pain.).     NON FORMULARY Allergy Vaccine -Meg Whelan/once a month     NON FORMULARY CPAP 11     olmesartan (BENICAR) 20 MG tablet Take 20 mg by mouth every evening.     Polyethyl Glycol-Propyl Glycol (SYSTANE ULTRA) 0.4-0.3 % SOLN Place 1-2 drops into both eyes 3 (three) times daily as needed (dry/irritated eyes.).     VENTOLIN  HFA 108 (90 Base) MCG/ACT inhaler USE 2 PUFFS EVERY 4 TO 6 HOURS AS NEEDED FOR COUGH/WHEEZING. 18 g 0   No current facility-administered medications for this visit.     Allergies  Allergen Reactions   Other Shortness Of Breath and Other (See Comments)    Animal Dander, grass, pollen      REVIEW OF SYSTEMS:  Negative unless noted in HPI [X]  denotes positive finding, [ ]  denotes negative finding Cardiac  Comments:  Chest pain or chest pressure:    Shortness of breath upon exertion:    Short of breath when lying flat:    Irregular heart rhythm:  Vascular    Pain in calf, thigh, or hip brought on by ambulation:    Pain in feet at night that wakes you up from your sleep:     Blood clot in your veins:    Leg swelling:         Pulmonary    Oxygen at home:    Productive cough:     Wheezing:         Neurologic    Sudden weakness in arms or legs:     Sudden numbness in arms or legs:     Sudden onset of difficulty speaking or slurred speech:    Temporary loss of vision in one eye:     Problems with dizziness:         Gastrointestinal    Blood in stool:     Vomited blood:         Genitourinary    Burning when urinating:     Blood in urine:        Psychiatric    Major depression:         Hematologic    Bleeding problems:    Problems with blood clotting too easily:        Skin    Rashes or ulcers:        Constitutional    Fever or chills:      PHYSICAL EXAMINATION:  General:  WDWN in NAD; vital signs documented above Gait: Normal HENT: WNL, normocephalic Pulmonary: normal non-labored breathing Cardiac: regular HR Extremities: small ulceration left lateral ankle, < 1 cm. No drainage or bleeding. Single suture removed. Multiple varicosities and reticular veins throughout LLE. Hyperpigmentation of the skin on LLE Musculoskeletal: no muscle wasting or atrophy  Neurologic: A&O X 3 Psychiatric:  The pt has Normal affect.  ASSESSMENT/PLAN:: 63 y.o. male here for follow up for wound check and suture removal. Area looks clean. Provided reassurance regarding drainage. Reviewed instructions to hold pressure in area should there  be recurrent bleeding. He is very anxious about it bleeding again after his experience before with calling EMS. He understands we cannot predict if this will occur again before he has sclerotherapy. He really would like to have his shoulder surgery and recovery fully before having his veins treated. He feels he already has a lot on his plate to undergo both at same time. He is having a friend already help him post op. He will in the mean time continue to keep ulcer clean and dry. Recommend allowing it to get some air and hopefull scab over. - continue exercise  -Continue compression stockings and elevation - He will call to schedule sclerotherapy treatment with Inocente Row, RN once he has recovered from his shoulder surgery   Teretha Damme, PA-C [facility] [phone]  Clinic MD:   [place]

## 2024-03-30 DIAGNOSIS — D0422 Carcinoma in situ of skin of left ear and external auricular canal: Secondary | ICD-10-CM | POA: Diagnosis not present

## 2024-03-30 DIAGNOSIS — D0421 Carcinoma in situ of skin of right ear and external auricular canal: Secondary | ICD-10-CM | POA: Diagnosis not present

## 2024-03-30 DIAGNOSIS — L570 Actinic keratosis: Secondary | ICD-10-CM | POA: Diagnosis not present

## 2024-03-30 NOTE — Progress Notes (Signed)
 Anesthesia Review:  PCP: Cardiologist : DR  Oneil Parchment LOV 01/22/24   PPM/ ICD: Device Orders: Rep Notified:  Chest x-ray : EKG : 02/03/2024  CT Cors- 01/26/24  Echo : Stress test: Cardiac Cath :  02/02/2024   Activity level:  Sleep Study/ CPAP : Fasting Blood Sugar :      / Checks Blood Sugar -- times a day:    Blood Thinner/ Instructions /Last Dose: ASA / Instructions/ Last Dose :    03/20/24- Wound check visit

## 2024-03-30 NOTE — Patient Instructions (Signed)
 SURGICAL WAITING ROOM VISITATION  Patients having surgery or a procedure may have no more than 2 support people in the waiting area - these visitors may rotate.    Children under the age of 19 must have an adult with them who is not the patient.  Visitors with respiratory illnesses are discouraged from visiting and should remain at home.  If the patient needs to stay at the hospital during part of their recovery, the visitor guidelines for inpatient rooms apply. Pre-op nurse will coordinate an appropriate time for 1 support person to accompany patient in pre-op.  This support person may not rotate.    Please refer to the [facility] website for the visitor guidelines for Inpatients (after your surgery is over and you are in a regular room).       Your procedure is scheduled on:  Wednesday 04/14/2024    Report to [facility] Main Entrance    Report to admitting at  1230 pm    Call this number if you have problems the morning of surgery [phone]   Do not eat food :After Midnight.   After Midnight you may have the following liquids until _ 1145_____ Am  DAY OF SURGERY  Water Non-Citrus Juices (without pulp, NO RED-Apple, White grape, White cranberry) Black Coffee (NO MILK/CREAM OR CREAMERS, sugar ok)  Clear Tea (NO MILK/CREAM OR CREAMERS, sugar ok) regular and decaf                             Plain Jell-O (NO RED)                                           Fruit ices (not with fruit pulp, NO RED)                                     Popsicles (NO RED)                                                               Sports drinks like Gatorade (NO RED)                     The day of surgery:  Drink ONE (1) Pre-Surgery Clear Ensure at   1145AM the morning of surgery. Drink in one sitting. Do not sip.  This drink was given to you during your hospital  pre-op appointment visit. Nothing else to drink after completing the  Pre-Surgery Clear Ensure.   Oral Hygiene is also  important to reduce your risk of infection.                                    Remember - BRUSH YOUR TEETH THE MORNING OF SURGERY WITH YOUR REGULAR TOOTHPASTE  DENTURES WILL BE REMOVED PRIOR TO SURGERY PLEASE DO NOT APPLY Poly grip OR ADHESIVES!!!   Do NOT smoke after Midnight   Stop all vitamins and herbal supplements 7 days before surgery.  Take these medicines the morning of surgery with A SIP OF WATER:   Allopurinol             Use Inhalers per normal routine            Bring rescue inhaler  Bring CPAP mask and tubing day of surgery.                              You may not have any metal on your body including jewelry, and body piercing             Do not wear lotions, powders, perfumes/cologne, or deodorant             Men may shave face and neck.   Do not bring valuables to the hospital. [facility] IS NOT             RESPONSIBLE   FOR VALUABLES.   Contacts, glasses, dentures or bridgework may not be worn into surgery.   Bring small overnight bag day of surgery.   DO NOT BRING YOUR HOME MEDICATIONS TO THE HOSPITAL. PHARMACY WILL DISPENSE MEDICATIONS LISTED ON YOUR MEDICATION LIST TO YOU DURING YOUR ADMISSION IN THE HOSPITAL!    Patients discharged on the day of surgery will not be allowed to drive home.  Someone NEEDS to stay with you for the first 24 hours after anesthesia.   Special Instructions: Bring a copy of your healthcare power of attorney and living will documents the day of surgery if you haven't scanned them before.              Please read over the following fact sheets you were given: IF YOU HAVE QUESTIONS ABOUT YOUR PRE-OP INSTRUCTIONS PLEASE CALL [phone].   If you received a COVID test during your pre-op visit  it is requested that you wear a mask when out in public, stay away from anyone that may not be feeling well and notify your surgeon if you develop symptoms. If you test positive for Covid or have been in contact with anyone that has tested  positive in the last 10 days please notify you surgeon.      Pre-operative 5 CHG Bath Instructions   You can play a key role in reducing the risk of infection after surgery. Your skin needs to be as free of germs as possible. You can reduce the number of germs on your skin by washing with CHG (chlorhexidine gluconate) soap before surgery. CHG is an antiseptic soap that kills germs and continues to kill germs even after washing.   DO NOT use if you have an allergy to chlorhexidine/CHG or antibacterial soaps. If your skin becomes reddened or irritated, stop using the CHG and notify one of our RNs at [phone].   Please shower with the CHG soap starting 4 days before surgery using the following schedule:     Please keep in mind the following:  DO NOT shave, including legs and underarms, starting the day of your first shower.   You may shave your face at any point before/day of surgery.  Place clean sheets on your bed the day you start using CHG soap. Use a clean washcloth (not used since being washed) for each shower. DO NOT sleep with pets once you start using the CHG.   CHG Shower Instructions:  If you choose to wash your hair and private area, wash first with your normal shampoo/soap.  After you  use shampoo/soap, rinse your hair and body thoroughly to remove shampoo/soap residue.  Turn the water OFF and apply about 3 tablespoons (45 ml) of CHG soap to a CLEAN washcloth.  Apply CHG soap ONLY FROM YOUR NECK DOWN TO YOUR TOES (washing for 3-5 minutes)  DO NOT use CHG soap on face, private areas, open wounds, or sores.  Pay special attention to the area where your surgery is being performed.  If you are having back surgery, having someone wash your back for you may be helpful. Wait 2 minutes after CHG soap is applied, then you may rinse off the CHG soap.  Pat dry with a clean towel  Put on clean clothes/pajamas   If you choose to wear lotion, please use ONLY the CHG-compatible lotions  on the back of this paper.     Additional instructions for the day of surgery: DO NOT APPLY any lotions, deodorants, cologne, or perfumes.   Put on clean/comfortable clothes.  Brush your teeth.  Ask your nurse before applying any prescription medications to the skin.      CHG Compatible Lotions   Aveeno Moisturizing lotion  Cetaphil Moisturizing Cream  Cetaphil Moisturizing Lotion  Clairol Herbal Essence Moisturizing Lotion, Dry Skin  Clairol Herbal Essence Moisturizing Lotion, Extra Dry Skin  Clairol Herbal Essence Moisturizing Lotion, Normal Skin  Curel Age Defying Therapeutic Moisturizing Lotion with Alpha Hydroxy  Curel Extreme Care Body Lotion  Curel Soothing Hands Moisturizing Hand Lotion  Curel Therapeutic Moisturizing Cream, Fragrance-Free  Curel Therapeutic Moisturizing Lotion, Fragrance-Free  Curel Therapeutic Moisturizing Lotion, Original Formula  Eucerin Daily Replenishing Lotion  Eucerin Dry Skin Therapy Plus Alpha Hydroxy Crme  Eucerin Dry Skin Therapy Plus Alpha Hydroxy Lotion  Eucerin Original Crme  Eucerin Original Lotion  Eucerin Plus Crme Eucerin Plus Lotion  Eucerin TriLipid Replenishing Lotion  Keri Anti-Bacterial Hand Lotion  Keri Deep Conditioning Original Lotion Dry Skin Formula Softly Scented  Keri Deep Conditioning Original Lotion, Fragrance Free Sensitive Skin Formula  Keri Lotion Fast Absorbing Fragrance Free Sensitive Skin Formula  Keri Lotion Fast Absorbing Softly Scented Dry Skin Formula  Keri Original Lotion  Keri Skin Renewal Lotion Keri Silky Smooth Lotion  Keri Silky Smooth Sensitive Skin Lotion  Nivea Body Creamy Conditioning Oil  Nivea Body Extra Enriched Lotion  Nivea Body Original Lotion  Nivea Body Sheer Moisturizing Lotion Nivea Crme  Nivea Skin Firming Lotion  NutraDerm 30 Skin Lotion  NutraDerm Skin Lotion  NutraDerm Therapeutic Skin Cream  NutraDerm Therapeutic Skin Lotion  ProShield Protective Hand Cream  Provon  moisturizing lotion   [place]- Preparing for Total Shoulder Arthroplasty    Before surgery, you can play an important role. Because skin is not sterile, your skin needs to be as free of germs as possible. You can reduce the number of germs on your skin by using the following products. Benzoyl Peroxide Gel Reduces the number of germs present on the skin Applied twice a day to shoulder area starting two days before surgery    ==================================================================  Please follow these instructions carefully:  BENZOYL PEROXIDE 5% GEL  Please do not use if you have an allergy to benzoyl peroxide.   If your skin becomes reddened/irritated stop using the benzoyl peroxide.  Starting two days before surgery, apply as follows: Apply benzoyl peroxide in the morning and at night. Apply after taking a shower. If you are not taking a shower clean entire shoulder front, back, and side along with the armpit with a clean wet washcloth.  Place a quarter-sized dollop on your shoulder and rub in thoroughly, making sure to cover the front, back, and side of your shoulder, along with the armpit.   2 days before ____ AM   ____ PM              1 day before ____ AM   ____ PM                         Do this twice a day for two days.  (Last application is the night before surgery, AFTER using the CHG soap as described below).  Do NOT apply benzoyl peroxide gel on the day of surgery.   Incentive Spirometer (Watch this video at home: [email or web address])  An incentive spirometer is a tool that can help keep your lungs clear and active. This tool measures how well you are filling your lungs with each breath. Taking long deep breaths may help reverse or decrease the chance of developing breathing (pulmonary) problems (especially infection) following: A long period of time when you are unable to move or be active. BEFORE THE PROCEDURE  If the spirometer  includes an indicator to show your best effort, your nurse or respiratory therapist will set it to a desired goal. If possible, sit up straight or lean slightly forward. Try not to slouch. Hold the incentive spirometer in an upright position. INSTRUCTIONS FOR USE  Sit on the edge of your bed if possible, or sit up as far as you can in bed or on a chair. Hold the incentive spirometer in an upright position. Breathe out normally. Place the mouthpiece in your mouth and seal your lips tightly around it. Breathe in slowly and as deeply as possible, raising the piston or the ball toward the top of the column. Hold your breath for 3-5 seconds or for as long as possible. Allow the piston or ball to fall to the bottom of the column. Remove the mouthpiece from your mouth and breathe out normally. Rest for a few seconds and repeat Steps 1 through 7 at least 10 times every 1-2 hours when you are awake. Take your time and take a few normal breaths between deep breaths. The spirometer may include an indicator to show your best effort. Use the indicator as a goal to work toward during each repetition. After each set of 10 deep breaths, practice coughing to be sure your lungs are clear. If you have an incision (the cut made at the time of surgery), support your incision when coughing by placing a pillow or rolled up towels firmly against it. Once you are able to get out of bed, walk around indoors and cough well. You may stop using the incentive spirometer when instructed by your caregiver.  RISKS AND COMPLICATIONS Take your time so you do not get dizzy or light-headed. If you are in pain, you may need to take or ask for pain medication before doing incentive spirometry. It is harder to take a deep breath if you are having pain. AFTER USE Rest and breathe slowly and easily. It can be helpful to keep track of a log of your progress. Your caregiver can provide you with a simple table to help with this. If you are  using the spirometer at home, follow these instructions: SEEK MEDICAL CARE IF:  You are having difficultly using the spirometer. You have trouble using the spirometer as often as instructed. Your pain medication is not giving enough relief while  using the spirometer. You develop fever of 100.5 F (38.1 C) or higher. SEEK IMMEDIATE MEDICAL CARE IF:  You cough up bloody sputum that had not been present before. You develop fever of 102 F (38.9 C) or greater. You develop worsening pain at or near the incision site. MAKE SURE YOU:  Understand these instructions. Will watch your condition. Will get help right away if you are not doing well or get worse. Document Released: 12/02/2006 Document Revised: 10/14/2011 Document Reviewed: 02/02/2007 [facility] Patient Information 2014 [place], [place].

## 2024-03-31 ENCOUNTER — Encounter (HOSPITAL_COMMUNITY): Payer: Self-pay

## 2024-03-31 NOTE — Progress Notes (Signed)
 For Anesthesia: PCP - Charlie Love, MD  Cardiologist -  Oneil Parchment, MD/[place], Lamarr HERO, NP cardiac clearance 02/18/24 in [facility]  Pulmonologist- Neysa Rama D, MD last office visit 10/09/23 in [facility]  Bowel Prep reminder:  Chest x-ray - greater than 1 year EKG - 02/03/24 in [facility] Stress Test - 07/28/2013 in [facility] ECHO -  Cardiac Cath - 02/02/24 in [facility] Pacemaker/ICD device last checked: Pacemaker orders received: Device Rep notified:  Spinal Cord Stimulator:  Sleep Study - 10/06/2023 in [facility] CPAP -   Fasting Blood Sugar -  Checks Blood Sugar _____ times a day Date and result of last Hgb A1c-  Last dose of GLP1 agonist-  GLP1 instructions:   Last dose of SGLT-2 inhibitors-  SGLT-2 instructions:   Blood Thinner Instructions: Aspirin  Instructions: Last Dose:  Activity level: Can go up a flight of stairs and activities of daily living without stopping and without chest pain and/or shortness of breath   Able to exercise without chest pain and/or shortness of breath   Unable to go up a flight of stairs without chest pain and/or shortness of breath     Anesthesia review: OSA, HTN  Patient denies shortness of breath, fever, cough and chest pain at PAT appointment   Patient verbalized understanding of instructions that were reviewed over the telephone.

## 2024-04-01 ENCOUNTER — Encounter (HOSPITAL_COMMUNITY): Payer: Self-pay

## 2024-04-01 ENCOUNTER — Encounter (HOSPITAL_COMMUNITY)
Admission: RE | Admit: 2024-04-01 | Discharge: 2024-04-01 | Disposition: A | Discharge status: Home / Self Care | Source: Ambulatory Visit | Attending: Orthopedic Surgery | Admitting: Orthopedic Surgery

## 2024-04-01 ENCOUNTER — Other Ambulatory Visit: Payer: Self-pay

## 2024-04-01 VITALS — BP 129/82 | HR 64 | Temp 98.3°F | Resp 18 | Ht 74.0 in | Wt 339.3 lb

## 2024-04-01 DIAGNOSIS — G4733 Obstructive sleep apnea (adult) (pediatric): Secondary | ICD-10-CM | POA: Diagnosis not present

## 2024-04-01 DIAGNOSIS — I10 Essential (primary) hypertension: Secondary | ICD-10-CM | POA: Diagnosis not present

## 2024-04-01 DIAGNOSIS — M19012 Primary osteoarthritis, left shoulder: Secondary | ICD-10-CM | POA: Diagnosis not present

## 2024-04-01 DIAGNOSIS — N400 Enlarged prostate without lower urinary tract symptoms: Secondary | ICD-10-CM | POA: Diagnosis not present

## 2024-04-01 DIAGNOSIS — Z01812 Encounter for preprocedural laboratory examination: Secondary | ICD-10-CM | POA: Insufficient documentation

## 2024-04-01 DIAGNOSIS — I83028 Varicose veins of left lower extremity with ulcer other part of lower leg: Secondary | ICD-10-CM | POA: Diagnosis not present

## 2024-04-01 DIAGNOSIS — Z01818 Encounter for other preprocedural examination: Secondary | ICD-10-CM

## 2024-04-01 DIAGNOSIS — I2510 Atherosclerotic heart disease of native coronary artery without angina pectoris: Secondary | ICD-10-CM | POA: Diagnosis not present

## 2024-04-01 DIAGNOSIS — L97829 Non-pressure chronic ulcer of other part of left lower leg with unspecified severity: Secondary | ICD-10-CM | POA: Diagnosis not present

## 2024-04-01 HISTORY — DX: Male erectile dysfunction, unspecified: N52.9

## 2024-04-01 HISTORY — DX: Inflammatory disease of prostate, unspecified: N41.9

## 2024-04-01 HISTORY — DX: Personal history of other diseases of the digestive system: Z87.19

## 2024-04-01 HISTORY — DX: Gout, unspecified: M10.9

## 2024-04-01 HISTORY — DX: Benign prostatic hyperplasia with lower urinary tract symptoms: N40.1

## 2024-04-01 HISTORY — DX: Other specified disorders of veins: I87.8

## 2024-04-01 LAB — CBC
HCT: 43.9 % (ref 39.0–52.0)
Hemoglobin: 14.5 g/dL (ref 13.0–17.0)
MCH: 31.3 pg (ref 26.0–34.0)
MCHC: 33 g/dL (ref 30.0–36.0)
MCV: 94.6 fL (ref 80.0–100.0)
Platelets: 179 K/uL (ref 150–400)
RBC: 4.64 MIL/uL (ref 4.22–5.81)
RDW: 13.9 % (ref 11.5–15.5)
WBC: 6.8 K/uL (ref 4.0–10.5)
nRBC: 0 % (ref 0.0–0.2)

## 2024-04-01 LAB — BASIC METABOLIC PANEL WITH GFR
Anion gap: 11 (ref 5–15)
BUN: 14 mg/dL (ref 8–23)
CO2: 23 mmol/L (ref 22–32)
Calcium: 10.1 mg/dL (ref 8.9–10.3)
Chloride: 104 mmol/L (ref 98–111)
Creatinine, Ser: 0.83 mg/dL (ref 0.61–1.24)
GFR, Estimated: >60 mL/min (ref >60)
Glucose, Bld: 111 mg/dL — ABNORMAL HIGH (ref 70–99)
Potassium: 4.7 mmol/L (ref 3.5–5.1)
Sodium: 137 mmol/L (ref 135–145)

## 2024-04-01 LAB — SURGICAL PCR SCREEN
MRSA, PCR: NEGATIVE
Staphylococcus aureus: POSITIVE — AB

## 2024-04-01 NOTE — Progress Notes (Signed)
 PCR results sent to Dr. Kay via [facility].

## 2024-04-02 NOTE — Progress Notes (Addendum)
 Anesthesia Chart Review   Case: [id number] Date/Time: 04/14/24 1441   Procedure: ARTHROPLASTY, SHOULDER, TOTAL, REVERSE (Left: Shoulder)   Anesthesia type: Choice   Diagnosis: Arthritis of left shoulder region [M19.012]   Pre-op diagnosis: Osteoarthritis of joint of left shoulder region   Location: [facility] ROOM 09 / [facility]   Surgeons: Kay Kemps, MD       DISCUSSION:63 y.o. never smoker with h/o HTN, OSA, mild to moderate nonobstructive CAD on cath, BPH, left shoulder OA scheduled for above procedure 04/14/2024 with Dr. Kemps Kay.   Pt with venous stasis ulcer, follows with vascular surgery.  Last seen 03/29/2024. Left lower extremity with current wound.  Vascular surgery plans for sclerotherapy after shoulder surgery.   Per cardiology preoperative evaluation 02/18/2024,  Chart reviewed as part of pre-operative protocol coverage. Given past medical history and time since last visit, based on ACC/AHA guidelines, SILVINO SELMAN is at acceptable risk for the planned procedure without further cardiovascular testing.  Per Dr. Jeffrie on 02/18/2024 Okay to proceed with surgery.  Low to moderate overall cardiac risk. Oneil Jeffrie, MD  Per pulmonology note, Addendum 12/17/23- Surgical Clearance- As of 10/09/23 exam, patient stable from a pulmonary standpoint to go forward with planned shoulder surgery. Recommend he use his CPAP 11 cwp in Recovery after anesthesia.   VS: BP 129/82   Pulse 64   Temp 36.8 C (Oral)   Resp 18   Ht 6' 2 (1.88 m)   Wt (!) 153.9 kg   SpO2 100%   BMI 43.56 kg/m   PROVIDERS: Shepard Ade, MD is PCP   Jeffrie Oneil, MD is Cardiologist  LABS: Labs reviewed: Acceptable for surgery. (all labs ordered are listed, but only abnormal results are displayed)  Labs Reviewed  SURGICAL PCR SCREEN - Abnormal; Notable for the following components:      Result Value   Staphylococcus aureus POSITIVE (*)    All other components within normal limits  BASIC METABOLIC  PANEL WITH GFR - Abnormal; Notable for the following components:   Glucose, Bld 111 (*)    All other components within normal limits  CBC     IMAGES:   EKG:   CV: Cardiac Cath 02/02/2024 Conclusions: Mild to moderate, nonobstructive coronary artery disease, as detailed below, including 50-60% proximal/mid LAD stenosis that is not hemodynamically significant (RFR equals 0.93).  There is also mild plaquing of up to 10-20% involving the mid and distal RCA.  LCx is angiographically normal. Normal left ventricular systolic function (LVEF 55-65%) with mildly elevated filling pressure (LVEDP 18 mmHg).   Recommendations: Continue medical therapy and risk factor modification prevent progression of CAD; will add atorvastatin  20 mg daily to target LDL less than 70.  Follow-up lipid panel should be checked in about 2 to 3 months. No cardiac contraindication to proceeding with elective left shoulder surgery. Past Medical History:  Diagnosis Date   Acute bronchitis    Allergy    Benign prostatic hyperplasia with lower urinary tract symptoms    Erectile dysfunction    Gout    History of hiatal hernia    small noted on scan   Morbid obesity (HCC) 06/02/2008   Qualifier: Diagnosis of  By: Latisha CMA, Leigh     Obesity, unspecified    Obstructive sleep apnea (adult) (pediatric)    Prostatitis    Sleep apnea    Tubular adenoma of colon 11/2008   Unspecified essential hypertension    Venous stasis    Left ankle  Past Surgical History:  Procedure Laterality Date   COLONOSCOPY WITH PROPOFOL  N/A 05/31/2014   Procedure: COLONOSCOPY WITH PROPOFOL ;  Surgeon: Lamar JONETTA Aho, MD;  Location: [facility];  Service: Endoscopy;  Laterality: N/A;   CORONARY PRESSURE/FFR STUDY N/A 02/02/2024   Procedure: CORONARY PRESSURE/FFR STUDY;  Surgeon: Mady Bruckner, MD;  Location: [facility];  Service: Cardiovascular;  Laterality: N/A;   LASER ABLATION  03/28/2014, 05/16/2014   left leg, right  leg   LEFT HEART CATH AND CORONARY ANGIOGRAPHY N/A 02/02/2024   Procedure: LEFT HEART CATH AND CORONARY ANGIOGRAPHY;  Surgeon: Mady Bruckner, MD;  Location: [facility];  Service: Cardiovascular;  Laterality: N/A;   VARICOCELE EXCISION      MEDICATIONS:  allopurinol  (ZYLOPRIM ) 300 MG tablet   atorvastatin  (LIPITOR) 20 MG tablet   budesonide -formoterol  (SYMBICORT ) 160-4.5 MCG/ACT inhaler   colchicine 0.6 MG tablet   doxycycline (ADOXA) 100 MG tablet   EPINEPHrine  0.3 mg/0.3 mL IJ SOAJ injection   ibuprofen (ADVIL) 200 MG tablet   naproxen sodium (ALEVE) 220 MG tablet   NON FORMULARY   NON FORMULARY   olmesartan (BENICAR) 20 MG tablet   Polyethyl Glycol-Propyl Glycol (SYSTANE ULTRA) 0.4-0.3 % SOLN   VENTOLIN  HFA 108 (90 Base) MCG/ACT inhaler   No current facility-administered medications for this encounter.   Harlene Hoots Ward, PA-C [facility] [phone]

## 2024-04-02 NOTE — Anesthesia Preprocedure Evaluation (Addendum)
 Anesthesia Evaluation  Patient identified by MRN, date of birth, ID band Patient awake    Reviewed: Allergy & Precautions, NPO status , Patient's Chart, lab work & pertinent test results  History of Anesthesia Complications Negative for: history of anesthetic complications  Airway Mallampati: II  TM Distance: >3 FB Neck ROM: Full    Dental  (+) Dental Advisory Given, Teeth Intact   Pulmonary sleep apnea and Continuous Positive Airway Pressure Ventilation    Pulmonary exam normal        Cardiovascular hypertension, Pt. on medications + CAD  Normal cardiovascular exam   '25 Cath - 1. Mild to moderate, nonobstructive coronary artery disease, including 50-60% proximal/mid LAD stenosis that is not hemodynamically significant (RFR equals 0.93).  There is also mild plaquing of up to 10-20% involving the mid and distal RCA.  LCx is angiographically normal. 2. Normal left ventricular systolic function (LVEF 55-65%) with mildly elevated filling pressure (LVEDP 18 mmHg).    Neuro/Psych negative neurological ROS  negative psych ROS   GI/Hepatic Neg liver ROS, hiatal hernia,,,  Endo/Other    Class 3 obesity  Renal/GU negative Renal ROS     Musculoskeletal  (+) Arthritis ,    Abdominal  (+) + obese  Peds  Hematology negative hematology ROS (+)   Anesthesia Other Findings   Reproductive/Obstetrics                              Anesthesia Physical Anesthesia Plan  ASA: 3  Anesthesia Plan: General   Post-op Pain Management: Regional block* and Tylenol  PO (pre-op)*   Induction: Intravenous  PONV Risk Score and Plan: 2 and Treatment may vary due to age or medical condition, Ondansetron  and Dexamethasone   Airway Management Planned: Oral ETT  Additional Equipment: None  Intra-op Plan:   Post-operative Plan: Extubation in OR  Informed Consent: I have reviewed the patients History and Physical,  chart, labs and discussed the procedure including the risks, benefits and alternatives for the proposed anesthesia with the patient or authorized representative who has indicated his/her understanding and acceptance.     Dental advisory given  Plan Discussed with: CRNA and Anesthesiologist  Anesthesia Plan Comments: (See PAT note 04/01/2024)         Anesthesia Quick Evaluation

## 2024-04-12 DIAGNOSIS — G4733 Obstructive sleep apnea (adult) (pediatric): Secondary | ICD-10-CM | POA: Diagnosis not present

## 2024-04-14 ENCOUNTER — Ambulatory Visit (HOSPITAL_COMMUNITY): Payer: Self-pay | Admitting: Physician Assistant

## 2024-04-14 ENCOUNTER — Ambulatory Visit (HOSPITAL_COMMUNITY): Admitting: Anesthesiology

## 2024-04-14 ENCOUNTER — Encounter (HOSPITAL_COMMUNITY): Payer: Self-pay | Admitting: Orthopedic Surgery

## 2024-04-14 ENCOUNTER — Ambulatory Visit (HOSPITAL_COMMUNITY)
Admission: RE | Admit: 2024-04-14 | Discharge: 2024-04-14 | Disposition: A | Discharge status: Home / Self Care | Attending: Orthopedic Surgery | Admitting: Orthopedic Surgery

## 2024-04-14 ENCOUNTER — Encounter (HOSPITAL_COMMUNITY)
Admission: RE | Disposition: A | Discharge status: Home / Self Care | Payer: Self-pay | Source: Home / Self Care | Attending: Orthopedic Surgery

## 2024-04-14 ENCOUNTER — Ambulatory Visit (HOSPITAL_COMMUNITY)

## 2024-04-14 DIAGNOSIS — M19012 Primary osteoarthritis, left shoulder: Secondary | ICD-10-CM | POA: Insufficient documentation

## 2024-04-14 DIAGNOSIS — G4733 Obstructive sleep apnea (adult) (pediatric): Secondary | ICD-10-CM | POA: Diagnosis not present

## 2024-04-14 DIAGNOSIS — I2510 Atherosclerotic heart disease of native coronary artery without angina pectoris: Secondary | ICD-10-CM | POA: Insufficient documentation

## 2024-04-14 DIAGNOSIS — I10 Essential (primary) hypertension: Secondary | ICD-10-CM | POA: Insufficient documentation

## 2024-04-14 DIAGNOSIS — Z6841 Body Mass Index (BMI) 40.0 and over, adult: Secondary | ICD-10-CM | POA: Insufficient documentation

## 2024-04-14 DIAGNOSIS — E66813 Obesity, class 3: Secondary | ICD-10-CM | POA: Insufficient documentation

## 2024-04-14 DIAGNOSIS — Z96612 Presence of left artificial shoulder joint: Secondary | ICD-10-CM | POA: Diagnosis not present

## 2024-04-14 DIAGNOSIS — G8918 Other acute postprocedural pain: Secondary | ICD-10-CM | POA: Diagnosis not present

## 2024-04-14 HISTORY — PX: REVERSE SHOULDER ARTHROPLASTY: SHX5054

## 2024-04-14 SURGERY — ARTHROPLASTY, SHOULDER, TOTAL, REVERSE
Anesthesia: General | Site: Shoulder | Laterality: Left

## 2024-04-14 MED ORDER — CHLORHEXIDINE GLUCONATE 0.12 % MT SOLN
15.0000 mL | Freq: Once | OROMUCOSAL | Status: AC
  Administered 2024-04-14: 15 mL via OROMUCOSAL

## 2024-04-14 MED ORDER — OXYCODONE HCL 5 MG PO TABS: 5.0000 mg | ORAL_TABLET | Freq: Once | ORAL | Status: DC | PRN

## 2024-04-14 MED ORDER — CEFAZOLIN SODIUM-DEXTROSE 2-4 GM/100ML-% IV SOLN
2.0000 g | INTRAVENOUS | Status: AC
  Administered 2024-04-14: 3 g via INTRAVENOUS
  Filled 2024-04-14: qty 100

## 2024-04-14 MED ORDER — STERILE WATER FOR IRRIGATION IR SOLN
Status: DC | PRN
  Administered 2024-04-14: 1000 mL

## 2024-04-14 MED ORDER — MUPIROCIN 2 % EX OINT: 1.0000 | TOPICAL_OINTMENT | Freq: Two times a day (BID) | CUTANEOUS | 0 refills | Status: AC

## 2024-04-14 MED ORDER — SODIUM CHLORIDE 0.9 % IV SOLN: 12.5000 mg | INTRAVENOUS | Status: DC | PRN

## 2024-04-14 MED ORDER — 0.9 % SODIUM CHLORIDE (POUR BTL) OPTIME
TOPICAL | Status: DC | PRN
  Administered 2024-04-14: 1000 mL

## 2024-04-14 MED ORDER — ONDANSETRON HCL 4 MG/2ML IJ SOLN
INTRAMUSCULAR | Status: DC | PRN
  Administered 2024-04-14: 4 mg via INTRAVENOUS

## 2024-04-14 MED ORDER — BUPIVACAINE-EPINEPHRINE (PF) 0.25% -1:200000 IJ SOLN
INTRAMUSCULAR | Status: DC | PRN
  Administered 2024-04-14: 15 mL

## 2024-04-14 MED ORDER — EPHEDRINE SULFATE (PRESSORS) 50 MG/ML IJ SOLN
INTRAMUSCULAR | Status: DC | PRN
  Administered 2024-04-14: 10 mg via INTRAVENOUS

## 2024-04-14 MED ORDER — FENTANYL CITRATE (PF) 250 MCG/5ML IJ SOLN
INTRAMUSCULAR | Status: DC | PRN
  Administered 2024-04-14: 50 ug via INTRAVENOUS

## 2024-04-14 MED ORDER — CHLORHEXIDINE GLUCONATE 4 % EX SOLN: 1.0000 | CUTANEOUS | 1 refills | Status: AC

## 2024-04-14 MED ORDER — VANCOMYCIN HCL 1000 MG IV SOLR
INTRAVENOUS | Status: AC
Start: 2024-04-14 — End: 2024-04-14
  Filled 2024-04-14: qty 20

## 2024-04-14 MED ORDER — METHOCARBAMOL 500 MG PO TABS: 500.0000 mg | ORAL_TABLET | Freq: Three times a day (TID) | ORAL | 1 refills | Status: AC | PRN

## 2024-04-14 MED ORDER — BUPIVACAINE HCL (PF) 0.5 % IJ SOLN
INTRAMUSCULAR | Status: DC | PRN
  Administered 2024-04-14: 15 mL via PERINEURAL

## 2024-04-14 MED ORDER — VANCOMYCIN HCL 1 G IV SOLR
INTRAVENOUS | Status: DC | PRN
  Administered 2024-04-14: 1000 mg via TOPICAL

## 2024-04-14 MED ORDER — PROPOFOL 10 MG/ML IV BOLUS
INTRAVENOUS | Status: DC | PRN
  Administered 2024-04-14: 50 mg via INTRAVENOUS
  Administered 2024-04-14: 40 mg via INTRAVENOUS
  Administered 2024-04-14: 160 mg via INTRAVENOUS

## 2024-04-14 MED ORDER — MIDAZOLAM HCL 2 MG/2ML IJ SOLN
0.5000 mg | Freq: Once | INTRAMUSCULAR | Status: AC
Start: 2024-04-14 — End: 2024-04-14
  Administered 2024-04-14: 1 mg via INTRAVENOUS
  Filled 2024-04-14: qty 2

## 2024-04-14 MED ORDER — TRANEXAMIC ACID-NACL 1000-0.7 MG/100ML-% IV SOLN
1000.0000 mg | INTRAVENOUS | Status: AC
  Administered 2024-04-14: 1000 mg via INTRAVENOUS
  Filled 2024-04-14: qty 100

## 2024-04-14 MED ORDER — ROCURONIUM 10MG/ML (10ML) SYRINGE FOR MEDFUSION PUMP - OPTIME
INTRAVENOUS | Status: DC | PRN
  Administered 2024-04-14: 40 mg via INTRAVENOUS
  Administered 2024-04-14: 30 mg via INTRAVENOUS

## 2024-04-14 MED ORDER — BUPIVACAINE LIPOSOME 1.3 % IJ SUSP
INTRAMUSCULAR | Status: DC | PRN
  Administered 2024-04-14: 10 mL via PERINEURAL

## 2024-04-14 MED ORDER — PHENYLEPHRINE HCL (PRESSORS) 10 MG/ML IV SOLN
INTRAVENOUS | Status: DC | PRN
  Administered 2024-04-14: 80 ug via INTRAVENOUS

## 2024-04-14 MED ORDER — SUGAMMADEX SODIUM 200 MG/2ML IV SOLN
INTRAVENOUS | Status: DC | PRN
  Administered 2024-04-14: 200 mg via INTRAVENOUS

## 2024-04-14 MED ORDER — LIDOCAINE 2% (20 MG/ML) 5 ML SYRINGE
INTRAMUSCULAR | Status: DC | PRN
  Administered 2024-04-14: 40 mg via INTRAVENOUS

## 2024-04-14 MED ORDER — ONDANSETRON HCL 4 MG/2ML IJ SOLN
INTRAMUSCULAR | Status: AC
  Filled 2024-04-14: qty 2

## 2024-04-14 MED ORDER — LACTATED RINGERS IV SOLN
INTRAVENOUS | Status: DC
Start: 2024-04-14 — End: 2024-04-15

## 2024-04-14 MED ORDER — BUPIVACAINE-EPINEPHRINE (PF) 0.25% -1:200000 IJ SOLN
INTRAMUSCULAR | Status: AC
Start: 2024-04-14 — End: 2024-04-14
  Filled 2024-04-14: qty 30

## 2024-04-14 MED ORDER — FENTANYL CITRATE (PF) 100 MCG/2ML IJ SOLN
INTRAMUSCULAR | Status: AC
  Filled 2024-04-14: qty 2

## 2024-04-14 MED ORDER — DEXAMETHASONE SODIUM PHOSPHATE 10 MG/ML IJ SOLN
INTRAMUSCULAR | Status: DC | PRN
  Administered 2024-04-14: 10 mg via INTRAVENOUS

## 2024-04-14 MED ORDER — FENTANYL CITRATE PF 50 MCG/ML IJ SOSY: 25.0000 ug | PREFILLED_SYRINGE | INTRAMUSCULAR | Status: DC | PRN

## 2024-04-14 MED ORDER — ACETAMINOPHEN 500 MG PO TABS
1000.0000 mg | ORAL_TABLET | Freq: Once | ORAL | Status: AC
  Administered 2024-04-14: 1000 mg via ORAL
  Filled 2024-04-14: qty 2

## 2024-04-14 MED ORDER — AMISULPRIDE (ANTIEMETIC) 5 MG/2ML IV SOLN: 10.0000 mg | Freq: Once | INTRAVENOUS | Status: DC | PRN

## 2024-04-14 MED ORDER — OXYCODONE HCL 5 MG/5ML PO SOLN: 5.0000 mg | Freq: Once | ORAL | Status: DC | PRN

## 2024-04-14 MED ORDER — PROPOFOL 10 MG/ML IV BOLUS
INTRAVENOUS | Status: AC
  Filled 2024-04-14: qty 20

## 2024-04-14 MED ORDER — SUCCINYLCHOLINE 20MG/ML (10ML) SYRINGE FOR MEDFUSION PUMP - OPTIME
INTRAMUSCULAR | Status: DC | PRN
  Administered 2024-04-14: 140 mg via INTRAVENOUS

## 2024-04-14 MED ORDER — ONDANSETRON HCL 4 MG PO TABS: 4.0000 mg | ORAL_TABLET | Freq: Three times a day (TID) | ORAL | 1 refills | Status: AC | PRN

## 2024-04-14 MED ORDER — DEXAMETHASONE SODIUM PHOSPHATE 10 MG/ML IJ SOLN
INTRAMUSCULAR | Status: AC
Start: 2024-04-14 — End: 2024-04-14
  Filled 2024-04-14: qty 1

## 2024-04-14 MED ORDER — ORAL CARE MOUTH RINSE: 15.0000 mL | Freq: Once | OROMUCOSAL | Status: AC

## 2024-04-14 MED ORDER — EPHEDRINE 5 MG/ML INJ
INTRAVENOUS | Status: AC
  Filled 2024-04-14: qty 5

## 2024-04-14 MED ORDER — PHENYLEPHRINE HCL-NACL 20-0.9 MG/250ML-% IV SOLN
INTRAVENOUS | Status: AC
  Filled 2024-04-14: qty 250

## 2024-04-14 MED ORDER — FENTANYL CITRATE PF 50 MCG/ML IJ SOSY
25.0000 ug | PREFILLED_SYRINGE | Freq: Once | INTRAMUSCULAR | Status: AC
Start: 2024-04-14 — End: 2024-04-14
  Administered 2024-04-14: 50 ug via INTRAVENOUS
  Filled 2024-04-14: qty 2

## 2024-04-14 MED ORDER — OXYCODONE HCL 5 MG PO TABS: 5.0000 mg | ORAL_TABLET | Freq: Four times a day (QID) | ORAL | 0 refills | Status: AC | PRN

## 2024-04-14 SURGICAL SUPPLY — 65 items
BAG COUNTER SPONGE SURGICOUNT (BAG) IMPLANT
BAG ZIPLOCK 12X15 (MISCELLANEOUS) IMPLANT
BEARING HUMERAL +3 40D (Joint) IMPLANT
BIT DRILL 1.6MX128 (BIT) IMPLANT
BIT DRILL 2.7 W/STOP DISP (BIT) IMPLANT
BIT DRILL TWIST 2.7 (BIT) IMPLANT
BLADE SAG 18X100X1.27 (BLADE) ×1 IMPLANT
CLSR STERI-STRIP ANTIMIC 1/2X4 (GAUZE/BANDAGES/DRESSINGS) IMPLANT
COMPONENT RV AUG LG W/TAPR/GLN (Joint) IMPLANT
COVER BACK TABLE 60X90IN (DRAPES) ×1 IMPLANT
COVER SURGICAL LIGHT HANDLE (MISCELLANEOUS) ×1 IMPLANT
DIAL VERSA SHOULDER 40 STD (Joint) IMPLANT
DRAPE INCISE IOBAN 66X45 STRL (DRAPES) ×1 IMPLANT
DRAPE POUCH INSTRU U-SHP 10X18 (DRAPES) ×1 IMPLANT
DRAPE SHEET LG 3/4 BI-LAMINATE (DRAPES) ×1 IMPLANT
DRAPE SURG ORHT 6 SPLT 77X108 (DRAPES) ×2 IMPLANT
DRAPE TOP 10253 STERILE (DRAPES) ×1 IMPLANT
DRAPE U-SHAPE 47X51 STRL (DRAPES) ×1 IMPLANT
DRSG ADAPTIC 3X8 NADH LF (GAUZE/BANDAGES/DRESSINGS) ×1 IMPLANT
DRSG AQUACEL AG ADV 3.5X10 (GAUZE/BANDAGES/DRESSINGS) ×1 IMPLANT
DURAPREP 26ML APPLICATOR (WOUND CARE) ×1 IMPLANT
ELECT BLADE TIP CTD 4 INCH (ELECTRODE) ×1 IMPLANT
ELECT NDL TIP 2.8 STRL (NEEDLE) ×1 IMPLANT
ELECT NEEDLE TIP 2.8 STRL (NEEDLE) ×1 IMPLANT
ELECT PENCIL ROCKER SW 15FT (MISCELLANEOUS) ×1 IMPLANT
ELECT REM PT RETURN 15FT ADLT (MISCELLANEOUS) ×1 IMPLANT
FACESHIELD WRAPAROUND OR TEAM (MASK) ×1 IMPLANT
GAUZE PAD ABD 8X10 STRL (GAUZE/BANDAGES/DRESSINGS) ×1 IMPLANT
GAUZE SPONGE 4X4 12PLY STRL (GAUZE/BANDAGES/DRESSINGS) ×1 IMPLANT
GLOVE BIOGEL PI IND STRL 7.5 (GLOVE) ×1 IMPLANT
GLOVE BIOGEL PI IND STRL 8.5 (GLOVE) ×1 IMPLANT
GLOVE ORTHO TXT STRL SZ7.5 (GLOVE) ×1 IMPLANT
GLOVE SURG ORTHO 8.5 STRL (GLOVE) ×1 IMPLANT
GOWN STRL REUS W/ TWL XL LVL3 (GOWN DISPOSABLE) ×2 IMPLANT
KIT BASIN OR (CUSTOM PROCEDURE TRAY) ×1 IMPLANT
KIT TURNOVER KIT A (KITS) ×1 IMPLANT
MANIFOLD NEPTUNE II (INSTRUMENTS) ×1 IMPLANT
NDL MAYO CATGUT SZ4 TPR NDL (NEEDLE) IMPLANT
NEEDLE MAYO CATGUT SZ4 (NEEDLE) IMPLANT
NS IRRIG 1000ML POUR BTL (IV SOLUTION) ×1 IMPLANT
PACK SHOULDER (CUSTOM PROCEDURE TRAY) ×1 IMPLANT
PIN FIX HEX IDENTITY 2.7X100 (PIN) IMPLANT
PIN THREADED REVERSE (PIN) IMPLANT
REAMER GUIDE BUSHING SURG DISP (MISCELLANEOUS) IMPLANT
REAMER GUIDE W/SCREW AUG (MISCELLANEOUS) IMPLANT
RESTRAINT HEAD UNIVERSAL NS (MISCELLANEOUS) ×1 IMPLANT
SCREW BONE LOCKING 4.75X30X3.5 (Screw) IMPLANT
SCREW BONE LOCKING 4.75X40X3.5 (Screw) IMPLANT
SCREW BONE STRL 6.5MMX30MM (Screw) IMPLANT
SCREW LOCKING NS 4.75MMX20MM (Screw) IMPLANT
SLING ARM FOAM STRAP LRG (SOFTGOODS) IMPLANT
SLING ARM FOAM STRAP XLG (SOFTGOODS) IMPLANT
SPIKE FLUID TRANSFER (MISCELLANEOUS) ×1 IMPLANT
STEM HUM IDENTITY 125 STD (Stem) IMPLANT
STRIP CLOSURE SKIN 1/2X4 (GAUZE/BANDAGES/DRESSINGS) ×1 IMPLANT
SUT MNCRL AB 4-0 PS2 18 (SUTURE) ×1 IMPLANT
SUT VIC AB 0 CT1 36 (SUTURE) ×1 IMPLANT
SUT VIC AB 0 CT2 27 (SUTURE) ×1 IMPLANT
SUT VIC AB 2-0 CT1 TAPERPNT 27 (SUTURE) ×1 IMPLANT
SUTURE FIBERWR #2 38 T-5 BLUE (SUTURE) ×2 IMPLANT
SUTURE FIBERWR#2 38 REV NDL BL (SUTURE) IMPLANT
TOWEL GREEN STERILE FF (TOWEL DISPOSABLE) ×1 IMPLANT
TOWEL OR 17X26 10 PK STRL BLUE (TOWEL DISPOSABLE) ×1 IMPLANT
TRAY HUMERAL NEUTRAL EXT 6 (Shoulder) IMPLANT
YANKAUER SUCT BULB TIP NO VENT (SUCTIONS) ×1 IMPLANT

## 2024-04-14 NOTE — Anesthesia Procedure Notes (Signed)
 Anesthesia Regional Block: Interscalene brachial plexus block   Pre-Anesthetic Checklist: , timeout performed,  Correct Patient, Correct Site, Correct Laterality,  Correct Procedure, Correct Position, site marked,  Risks and benefits discussed,  Surgical consent,  Pre-op evaluation,  At surgeon's request and post-op pain management  Laterality: Left  Prep: chloraprep       Needles:  Injection technique: Single-shot  Needle Type: Echogenic Needle     Needle Length: 5cm  Needle Gauge: 21     Additional Needles:   Narrative:  Start time: 04/14/2024 2:17 PM End time: 04/14/2024 2:20 PM Injection made incrementally with aspirations every 5 mL.  Performed by: Personally  Anesthesiologist: Lucious Debby BRAVO, MD  Additional Notes: No pain on injection. No increased resistance to injection. Injection made in 5cc increments. Good needle visualization. Patient tolerated the procedure well.

## 2024-04-14 NOTE — Transfer of Care (Signed)
 Immediate Anesthesia Transfer of Care Note  Patient: Casey Roach  Procedure(s) Performed: ARTHROPLASTY, SHOULDER, TOTAL, REVERSE (Left: Shoulder)  Patient Location: PACU  Anesthesia Type:General and Regional  Level of Consciousness: awake and alert   Airway & Oxygen Therapy: Patient Spontanous Breathing and Patient connected to face mask oxygen  Post-op Assessment: Report given to RN and Post -op Vital signs reviewed and stable  Post vital signs: Reviewed and stable  Last Vitals:  Vitals Value Taken Time  BP    Temp    Pulse 81 04/14/24 17:45  Resp 16 04/14/24 17:45  SpO2 95 % 04/14/24 17:45  Vitals shown include unfiled device data.  Last Pain:  Vitals:   04/14/24 1420  TempSrc:   PainSc: 0-No pain      Patients Stated Pain Goal: 4 (04/14/24 1334)  Complications: No notable events documented.

## 2024-04-14 NOTE — Discharge Instructions (Signed)
 Ice to the shoulder constantly.  Keep the incision covered and clean and dry for one week, then ok to get it wet in the shower. Change to the Aquacel bandage on Friday. May shower once that gel bandage is on and sealed(waterproof) leave than on for one week then ok to remove that and leave open to air  Do gentle hand wrist and elbow exercise as instructed several times per day. Do not push pull or lift with the arm. Ok to brush teeth and eat with that arm and ok to use a phone.   DO NOT reach behind your back or push up out of a chair with the operative arm.  Use a sling while you are up and around for comfort, may remove while seated.  Keep pillow propped behind the operative elbow.  Follow up with Dr Kay in two weeks in the office, call [phone] for appt  Please call Dr Kay (cell) [phone] with any questions or concerns

## 2024-04-14 NOTE — Interval H&P Note (Signed)
 History and Physical Interval Note:  04/14/2024 2:28 PM  Casey Roach  has presented today for surgery, with the diagnosis of Osteoarthritis of joint of left shoulder region.  The various methods of treatment have been discussed with the patient and family. After consideration of risks, benefits and other options for treatment, the patient has consented to  Procedure(s): ARTHROPLASTY, SHOULDER, TOTAL, REVERSE (Left) as a surgical intervention.  The patient's history has been reviewed, patient examined, no change in status, stable for surgery.  I have reviewed the patient's chart and labs.  Questions were answered to the patient's satisfaction.     Casey Roach

## 2024-04-14 NOTE — Brief Op Note (Signed)
 04/14/2024  5:07 PM  PATIENT:  Casey Roach  63 y.o. male  PRE-OPERATIVE DIAGNOSIS:  Osteoarthritis of joint of left shoulder region, end stage with B2 glenoid  POST-OPERATIVE DIAGNOSIS:  Osteoarthritis of joint of left shoulder region, end stage with B2 glenoid  PROCEDURE:  Procedure(s): ARTHROPLASTY, SHOULDER, TOTAL, REVERSE (Left) Biomet Identity Stem with comprehensive augmented baseplate, NO subscap repair  SURGEON:  Surgeons and Role:    DEWAINE Kay Kemps, MD - Primary  PHYSICIAN ASSISTANT:   ASSISTANTS: Debby KATHEE Fireman, PA-C   ANESTHESIA:   regional and general  EBL:  250 cc  BLOOD ADMINISTERED:none  DRAINS: none   LOCAL MEDICATIONS USED:  MARCAINE      SPECIMEN:  No Specimen  DISPOSITION OF SPECIMEN:  N/A  COUNTS:  YES  TOURNIQUET:  * No tourniquets in log *  DICTATION: .Other Dictation: Dictation Number [id number]  PLAN OF CARE: Discharge to home after PACU  PATIENT DISPOSITION:  PACU - hemodynamically stable.   Delay start of Pharmacological VTE agent (>24hrs) due to surgical blood loss or risk of bleeding: not applicable

## 2024-04-14 NOTE — Anesthesia Postprocedure Evaluation (Signed)
 Anesthesia Post Note  Patient: Casey Roach  Procedure(s) Performed: ARTHROPLASTY, SHOULDER, TOTAL, REVERSE (Left: Shoulder)     Patient location during evaluation: PACU Anesthesia Type: General Level of consciousness: awake and alert Pain management: pain level controlled Vital Signs Assessment: post-procedure vital signs reviewed and stable Respiratory status: spontaneous breathing, nonlabored ventilation and respiratory function stable Cardiovascular status: stable and blood pressure returned to baseline Anesthetic complications: no   No notable events documented.  Last Vitals:  Vitals:   04/14/24 1800 04/14/24 1815  BP: 123/67 118/73  Pulse: 78 73  Resp: 17 (!) 25  Temp:    SpO2: 94% 92%    Last Pain:  Vitals:   04/14/24 1815  TempSrc:   PainSc: 0-No pain                 Debby FORBES Like

## 2024-04-14 NOTE — Anesthesia Procedure Notes (Signed)
 Procedure Name: Intubation Date/Time: 04/14/2024 3:19 PM  Performed by: Nanci Riis, CRNAPre-anesthesia Checklist: Patient identified, Emergency Drugs available, Suction available, Patient being monitored and Timeout performed Patient Re-evaluated:Patient Re-evaluated prior to induction Oxygen Delivery Method: Circle system utilized Preoxygenation: Pre-oxygenation with 100% oxygen Induction Type: IV induction Ventilation: Mask ventilation without difficulty Laryngoscope Size: Miller and 3 Grade View: Grade I Tube type: Oral Tube size: 7.5 mm Number of attempts: 1 Airway Equipment and Method: Stylet Placement Confirmation: ETT inserted through vocal cords under direct vision, positive ETCO2 and breath sounds checked- equal and bilateral Secured at: 23 cm Tube secured with: Tape Dental Injury: Teeth and Oropharynx as per pre-operative assessment

## 2024-04-15 ENCOUNTER — Encounter (HOSPITAL_COMMUNITY): Payer: Self-pay | Admitting: Orthopedic Surgery

## 2024-04-15 NOTE — Op Note (Signed)
 NAME: Casey Roach, Casey Roach MEDICAL RECORD NO: [id number] ACCOUNT NO: [id number] DATE OF BIRTH: 02-11-1961 FACILITY: [facility] LOCATION: [facility] PHYSICIAN: Elspeth SAUNDERS. Kay, MD  Operative Report   DATE OF PROCEDURE: 04/14/2024  PREOPERATIVE DIAGNOSIS:  Left shoulder end-stage osteoarthritis with B2 glenoid.  POSTOPERATIVE DIAGNOSIS:  Left shoulder end-stage osteoarthritis with B2 glenoid.  PROCEDURE PERFORMED:  Left reverse total shoulder arthroplasty using Biomet Identity system with comprehensive augmented baseplate.  No subscapularis repair.  ATTENDING SURGEON:  Elspeth SAUNDERS. Kay, MD  ASSISTANT:  Debby Crock [place], [place], who was scrubbed during the entire procedure, and necessary for satisfactory completion of surgery.  ANESTHESIA:  General anesthesia was used plus interscalene block.  ESTIMATED BLOOD LOSS:  250 mL.  FLUID REPLACEMENT:  1500 mL crystalloids.  COUNTS:  Instrument counts were correct.  COMPLICATIONS: There were no complications.  ANTIBIOTICS:  Perioperative antibiotics were given.  INDICATIONS:  The patient is a 63 year old male who presents with a history of worsening left shoulder pain secondary to end-stage osteoarthritis. The patient has had progressive posterior subluxation of the humeral head relative to the glenoid and has a  B2 glenoid. The patient has severe retroversion.  Based on the positioning of the patient's humerus relative to the glenoid and the B2 glenoid, we recommended a reverse total shoulder arthroplasty to provide a reliable clinical result.  The patient is  not a candidate for anatomic based on his bone loss.  Informed consent obtained.  DESCRIPTION OF PROCEDURE:  After an adequate level of anesthesia was achieved, the patient was positioned in the modified beach chair position.  The left shoulder was correctly identified and sterile prep and drape were performed. Timeout called  verifying the correct patient and correct site.  We entered the  patient's shoulder using standard deltopectoral incision starting at the coracoid process and extending down to the anterior humerus with a #10 blade scalpel. Dissection down through the  subcutaneous tissues using Bovie.  Cephalic vein identified and taken laterally to the deltoid.  Pectoralis was taken medially.  Conjoined tendon was identified and retracted medially. Deep retractor was placed.  We identified the biceps tendon and  tenodesed that in situ with #0 Vicryl figure-of-eight suture x 2.  We then released the subscapularis off the lesser tuberosity intact for protection of the axillary nerve with no plans to repair that in this patient, he is going to be using his arm  immediately for activities of daily living.  We then went ahead and progressively externally rotated the shoulder delivering the humeral head out of the wound.  This was devoid of cartilage.  We entered the proximal humerus with a 6 mm starting reamer,  reaming up to a size 15 reamer.  We then cut the head at 30 degrees of retroversion with the oscillating saw with the intramedullary guide.  We then removed the guide and removed osteophytes off the medial humeral neck with a rongeur getting back to the  native bone.  Once we had that accomplished, we subluxed the humerus posteriorly.  We had decent exposure of the glenoid face.  We removed the capsule, labrum, and biceps stump.  We placed our glenoid retractors.  We were then able to identify the double  dip deformity consistent with a B2 glenoid.  We found the center point for a guide pin and we gauged the depth of the posterior bone loss to a large augment for the baseplate.  Thus, we went ahead and placed the large augmented baseplate guide and  drilled  our guide pin bicortically.  We then did our starting reamer down to subchondral bone.  We then placed the augment guide and reamer in place and then drilled out our anterior hole.  Once we had the augmented reamer guide in place,  we placed a  screw through to the glenoid vault to hold that in position.  We then reamed and I am not sure that we actually did anything more than touch the posterior glenoid.  With the preparation done, we selected the mini baseplate with the large augment and  impacted that in appropriate rotation providing best metal augmentation of missing bone. With the baseplate secure, we placed a 35 screw, a central screw getting good purchase. That was a 6.5 screw. We then placed a 40 screw inferiorly, a 30 crew  superiorly, and then a 20 screw anteriorly all with excellent purchase.  These were locking screws.  With the baseplate secured, we selected a real 40 +0 glenosphere and then placed that on the A setting and then impacted that onto the baseplate.  That  was secure.  I did the secondary impactor and made sure that the glenosphere was not moving and it was not.  I did a finger sweep to make sure we had no soft tissue caught in between the baseplate and the glenosphere.  We then went to the humeral side.   There was some bone loss of the medial calcar just as part of the preparation of the retroverted glenoid, but we did not feel like that was going to compromise our fixation on the humeral side.  So, we started broaching at a size 13.  We did 13, 14, and  15, and we were happy with our #15 broach.  We then reamed for the Identity tray.  Once we had that done, we trialed off that 15 stem with the standard Identity tray and then used the +3 poly.  With that in place, we reduced the shoulder.  Nice little  pop as it reduced. We were happy with our soft tissue balancing.  We removed all trial components, irrigated thoroughly, placed a little bit of vancomycin  powder into the humerus, and then impacted the pore-coat press-fit stem size 15 with the -6  extended humeral tray that was impacted into place with excellent purchase at the appropriate version, 30 degrees of retroversion. We then selected the real 40 +3  poly, a +3 mm, 40 mm diameter, and snapped that onto the humeral tray.  We reduced the  shoulder. Again, a nice little pop as it reduced.  Appropriate conjoint tensioning.  No gapping at all with inferior pull or external rotation.  We resected the subscapularis.  We irrigated thoroughly and then placed the remaining 1 g of vancomycin   powder into the wound, in the deeper aspect of the wound.  We then closed the deltopectoral interval with 0 Vicryl suture followed by 2-0 Vicryl for subcutaneous closure and running 4-0 Monocryl for skin. Steri-Strips were applied followed by a sterile  dressing.  The patient tolerated the surgery well.    MUK D: 04/14/2024 5:15:32 pm T: 04/15/2024 12:27:00 am  JOB: [id number]/ [id number]

## 2024-04-28 DIAGNOSIS — Z4789 Encounter for other orthopedic aftercare: Secondary | ICD-10-CM | POA: Diagnosis not present

## 2024-04-30 DIAGNOSIS — J3081 Allergic rhinitis due to animal (cat) (dog) hair and dander: Secondary | ICD-10-CM | POA: Diagnosis not present

## 2024-04-30 DIAGNOSIS — J301 Allergic rhinitis due to pollen: Secondary | ICD-10-CM | POA: Diagnosis not present

## 2024-04-30 DIAGNOSIS — J3089 Other allergic rhinitis: Secondary | ICD-10-CM | POA: Diagnosis not present

## 2024-05-11 DIAGNOSIS — Z23 Encounter for immunization: Secondary | ICD-10-CM | POA: Diagnosis not present

## 2024-05-25 NOTE — Progress Notes (Signed)
 Office Visit Note  Patient: Casey Roach             Date of Birth: 28-Apr-1961           MRN: [id number]             PCP: Shepard Ade, MD Referring: Shepard Ade, MD Visit Date: 06/08/2024 Occupation: Data Unavailable  Subjective:  Medication management   History of Present Illness: COLT MARTELLE is a 63 y.o. male with gout and osteoarthritis.  He returns today after his last visit in April 2025.  He states he had left reverse total shoulder replacement on April 14, 2024 by Dr. Mariea.  He is going to physical therapy.  He has not had any gout flare.  He has been taking allopurinol  450 mg daily without any interruption.  He did not have to take colchicine.  None of the other joints are painful.  He was recently diagnosed with coronary artery disease.  He started taking statin.    Activities of Daily Living:  Patient reports morning stiffness for 0 minutes.   Patient Denies nocturnal pain.  Difficulty dressing/grooming: Denies Difficulty climbing stairs: Denies Difficulty getting out of chair: Denies Difficulty using hands for taps, buttons, cutlery, and/or writing: Denies  Review of Systems  Constitutional:  Negative for fatigue.  HENT:  Negative for mouth sores and mouth dryness.   Eyes:  Negative for dryness.  Respiratory:  Negative for shortness of breath.   Cardiovascular:  Negative for chest pain and palpitations.  Gastrointestinal:  Negative for blood in stool, constipation and diarrhea.  Endocrine: Negative for increased urination.  Genitourinary:  Negative for involuntary urination.  Musculoskeletal:  Negative for joint pain, gait problem, joint pain, joint swelling, myalgias, muscle weakness, morning stiffness, muscle tenderness and myalgias.  Skin:  Negative for color change, rash, hair loss and sensitivity to sunlight.  Allergic/Immunologic: Negative for susceptible to infections.  Neurological:  Negative for dizziness and headaches.   Hematological:  Negative for swollen glands.  Psychiatric/Behavioral:  Negative for depressed mood and sleep disturbance. The patient is not nervous/anxious.     PMFS History:  Patient Active Problem List   Diagnosis Date Noted   Osteoarthritis 07/10/2018   Hepatic steatosis 09/15/2017   Pain in joint of right hip 08/25/2017   Varicose veins of left lower extremity with complications 02/20/2016   Varicose veins of bilateral lower extremities with other complications 01/16/2016   Insomnia 12/03/2015   History of colonic polyps 05/31/2014   Benign neoplasm of colon 05/31/2014   Varicose veins without complication 05/23/2014   Benign nodular prostatic hyperplasia with lower urinary tract symptoms 05/04/2014   ED (erectile dysfunction) of organic origin 05/04/2014   Hypogonadism in male 05/04/2014   Varicose veins of bilateral lower extremities with other complications 04/18/2014   Varicose veins of lower extremities with ulcer and inflammation (HCC) 02/08/2014   Morbid obesity (HCC) 07/21/2013   HTN (hypertension) 07/21/2013   Dyspnea 07/21/2013   ACUTE BRONCHITIS 06/29/2010   Obstructive sleep apnea 06/02/2008   HYPERTENSION 06/02/2008    Past Medical History:  Diagnosis Date   Acute bronchitis    Allergy    Benign prostatic hyperplasia with lower urinary tract symptoms    Erectile dysfunction    Gout    History of hiatal hernia    small noted on scan   Morbid obesity (HCC) 06/02/2008   Qualifier: Diagnosis of  By: Latisha CMA, Leigh     Obesity, unspecified    Obstructive  sleep apnea (adult) (pediatric)    Prostatitis    Sleep apnea    Tubular adenoma of colon 11/2008   Unspecified essential hypertension    Venous stasis    Left ankle    Family History  Problem Relation Age of Onset   Diabetes Mother        borderline   Pancreatic cancer Maternal Grandmother    Colon polyps Neg Hx    Esophageal cancer Neg Hx    Rectal cancer Neg Hx    Stomach cancer Neg Hx     Colon cancer Neg Hx    Past Surgical History:  Procedure Laterality Date   COLONOSCOPY WITH PROPOFOL  N/A 05/31/2014   Procedure: COLONOSCOPY WITH PROPOFOL ;  Surgeon: Lamar JONETTA Aho, MD;  Location: [facility];  Service: Endoscopy;  Laterality: N/A;   CORONARY PRESSURE/FFR STUDY N/A 02/02/2024   Procedure: CORONARY PRESSURE/FFR STUDY;  Surgeon: Mady Bruckner, MD;  Location: [facility];  Service: Cardiovascular;  Laterality: N/A;   LASER ABLATION  03/28/2014, 05/16/2014   left leg, right leg   LEFT HEART CATH AND CORONARY ANGIOGRAPHY N/A 02/02/2024   Procedure: LEFT HEART CATH AND CORONARY ANGIOGRAPHY;  Surgeon: Mady Bruckner, MD;  Location: [facility];  Service: Cardiovascular;  Laterality: N/A;   REVERSE SHOULDER ARTHROPLASTY Left 04/14/2024   Procedure: ARTHROPLASTY, SHOULDER, TOTAL, REVERSE;  Surgeon: Kay Kemps, MD;  Location: [facility];  Service: Orthopedics;  Laterality: Left;   VARICOCELE EXCISION     Social History   Tobacco Use   Smoking status: Never    Passive exposure: Never   Smokeless tobacco: Never  Vaping Use   Vaping status: Never Used  Substance Use Topics   Alcohol  use: Yes    Comment: occ   Drug use: No   Social History   Social History Narrative   Not on file     Immunization History  Administered Date(s) Administered   INFLUENZA, HIGH DOSE SEASONAL PF 06/25/2016, 06/24/2017, 07/06/2018, 07/08/2019, 07/10/2020   Influenza Split 05/06/2011, 05/05/2012, 05/05/2013, 05/05/2014, 04/05/2017, 05/09/2018   Influenza,inj,Quad PF,6+ Mos 05/06/2015   Influenza,inj,quad, With Preservative 04/07/2017   Influenza-Unspecified 05/05/2022   PFIZER(Purple Top)SARS-COV-2 Vaccination 10/28/2019, 11/22/2019, 06/18/2020, 07/10/2020     Objective: Vital Signs: BP 125/80   Pulse 62   Temp 98.4 F (36.9 C)   Resp 17   Ht 6' 2.5 (1.892 m)   Wt (!) 329 lb (149.2 kg)   BMI 41.68 kg/m    Physical Exam Vitals and nursing note reviewed.   Constitutional:      Appearance: He is well-developed.  HENT:     Head: Normocephalic and atraumatic.  Eyes:     Conjunctiva/sclera: Conjunctivae normal.     Pupils: Pupils are equal, round, and reactive to light.  Cardiovascular:     Rate and Rhythm: Normal rate and regular rhythm.     Heart sounds: Normal heart sounds.  Pulmonary:     Effort: Pulmonary effort is normal.     Breath sounds: Normal breath sounds.  Abdominal:     General: Bowel sounds are normal.     Palpations: Abdomen is soft.  Musculoskeletal:     Cervical back: Normal range of motion and neck supple.  Skin:    General: Skin is warm and dry.     Capillary Refill: Capillary refill takes less than 2 seconds.  Neurological:     Mental Status: He is alert and oriented to person, place, and time.  Psychiatric:  Behavior: Behavior normal.      Musculoskeletal Exam: He had good range of motion of the cervical spine.  Thoracic or lumbar spine range of motion was difficult to assess due to body habitus.  Right shoulder joint was in full range of motion.  Left shoulder joint abduction was limited to 90 degrees and forward flexion 110 degrees.  He had limited internal rotation.  Elbows, wrist joints, MCPs PIPs and DIPs were in good range of motion with bilateral PIP and DIP thickening.  Hip joints and knee joints in good range of motion.  There was no tenderness over ankles or MTPs.  Bilateral lower extremity edema was noted.  CDAI Exam: CDAI Score: -- Patient Global: --; Provider Global: -- Swollen: --; Tender: -- Joint Exam 06/08/2024   No joint exam has been documented for this visit   There is currently no information documented on the homunculus. Go to the Rheumatology activity and complete the homunculus joint exam.  Investigation: No additional findings.  Imaging: No results found.  Recent Labs: Lab Results  Component Value Date   WBC 6.8 04/01/2024   HGB 14.5 04/01/2024   PLT 179 04/01/2024    NA 137 04/01/2024   K 4.7 04/01/2024   CL 104 04/01/2024   CO2 23 04/01/2024   GLUCOSE 111 (H) 04/01/2024   BUN 14 04/01/2024   CREATININE 0.83 04/01/2024   BILITOT 0.6 11/25/2023   ALKPHOS 61 07/14/2018   AST 18 11/25/2023   ALT 17 11/25/2023   PROT 6.6 11/25/2023   ALBUMIN 4.3 07/14/2018   CALCIUM  10.1 04/01/2024   GFRAA  10/27/2010    >60        The eGFR has been calculated using the MDRD equation. This calculation has not been validated in all clinical situations. eGFR's persistently <60 mL/min signify possible Chronic Kidney Disease.    Speciality Comments: No specialty comments available.  Procedures:  No procedures performed Allergies: Other   Assessment / Plan:     Visit Diagnoses: Idiopathic chronic gout of multiple sites without tophus - Crystal proven gout-left knee aspiration performed Dr. Kay.  Dx with gout 4 years ago. Uric acid was 4.8 on 11/25/2023. -He has not experienced a gout flare.  He wanted to know if he could lower the dose of allopurinol .  I discouraged increasing the dose of allopurinol .  Will continue allopurinol  300 mg p.o. daily 1-1/2 tablet daily.  Prescription refill for allopurinol  was sent.  Plan: Uric acid, allopurinol  (ZYLOPRIM ) 300 MG tablet  Medication monitoring encounter - Allopurinol  450 mg by mouth daily. -November 25, 2023 CBC and CMP were normal.  Will recheck labs today.  Plan: CBC with Differential/Platelet, Comprehensive metabolic panel with GFR  Elevated sed rate - ESR WNL-6 on 08/27/23.  Status post reverse total shoulder replacement, left -by Dr. Karlyne May 24, 2024-.  Patient is going to physical therapy and gradually improving.  Trochanteric bursitis of right hip-currently not symptomatic.  Chondromalacia patellae, left knee-he has intermittent discomfort in his knee joints.  Primary osteoarthritis of both feet-proper fitting shoes were advised.  Coronary artery disease involving native coronary artery of native  heart without angina pectoris -he was recently diagnosed with coronary artery disease based on calcium  CT score and cardiac cath.  He started statins.  Detailed counsel regarding weight loss diet and exercise was discussed.  A handout on dietary modifications was given.  I will also refer him to weight management.  Plan: Amb Ref to Medical Weight Management  Dyslipidemia -he  is on Lipitor 20 mg p.o. daily now.  Plan: Lipid panel, Amb Ref to Medical Weight Management  Primary hypertension-blood pressure was 125/80.  Screening for diabetes mellitus - Plan: Hemoglobin A1c  Varicose veins of lower extremities with ulcer and inflammation (HCC)  Hepatic steatosis - Plan: Amb Ref to Medical Weight Management  Benign nodular prostatic hyperplasia with lower urinary tract symptoms - Plan: PSA  Obstructive sleep apnea  History of colonic polyps  Hypogonadism in male  ED (erectile dysfunction) of organic origin  History of proctitis  BMI 40.0-44.9, adult (HCC) -patient had some intentional weight loss but has not been very successful.  Plan: Amb Ref to Medical Weight Management  Orders: Orders Placed This Encounter  Procedures   Lipid panel   PSA   Uric acid   CBC with Differential/Platelet   Comprehensive metabolic panel with GFR   Hemoglobin A1c   Amb Ref to Medical Weight Management   Meds ordered this encounter  Medications   allopurinol  (ZYLOPRIM ) 300 MG tablet    Sig: Take 1.5 tablets (450 mg total) by mouth daily.    Dispense:  135 tablet    Refill:  0     Follow-Up Instructions: Return in about 6 months (around 12/06/2024) for Osteoarthritis, Gout.   Maya Nash, MD  Note - This record has been created using Animal nutritionist.  Chart creation errors have been sought, but may not always  have been located. Such creation errors do not reflect on  the standard of medical care.

## 2024-05-27 DIAGNOSIS — Z4789 Encounter for other orthopedic aftercare: Secondary | ICD-10-CM | POA: Diagnosis not present

## 2024-05-28 DIAGNOSIS — J3081 Allergic rhinitis due to animal (cat) (dog) hair and dander: Secondary | ICD-10-CM | POA: Diagnosis not present

## 2024-05-28 DIAGNOSIS — M25512 Pain in left shoulder: Secondary | ICD-10-CM | POA: Diagnosis not present

## 2024-05-28 DIAGNOSIS — J3089 Other allergic rhinitis: Secondary | ICD-10-CM | POA: Diagnosis not present

## 2024-05-28 DIAGNOSIS — J301 Allergic rhinitis due to pollen: Secondary | ICD-10-CM | POA: Diagnosis not present

## 2024-05-31 DIAGNOSIS — M25512 Pain in left shoulder: Secondary | ICD-10-CM | POA: Diagnosis not present

## 2024-06-02 DIAGNOSIS — M25512 Pain in left shoulder: Secondary | ICD-10-CM | POA: Diagnosis not present

## 2024-06-04 DIAGNOSIS — J3089 Other allergic rhinitis: Secondary | ICD-10-CM | POA: Diagnosis not present

## 2024-06-04 DIAGNOSIS — J301 Allergic rhinitis due to pollen: Secondary | ICD-10-CM | POA: Diagnosis not present

## 2024-06-07 DIAGNOSIS — M25512 Pain in left shoulder: Secondary | ICD-10-CM | POA: Diagnosis not present

## 2024-06-08 ENCOUNTER — Ambulatory Visit: Attending: Rheumatology | Admitting: Rheumatology

## 2024-06-08 ENCOUNTER — Encounter: Payer: Self-pay | Admitting: Rheumatology

## 2024-06-08 VITALS — BP 125/80 | HR 62 | Temp 98.4°F | Resp 17 | Ht 74.5 in | Wt 329.0 lb

## 2024-06-08 DIAGNOSIS — Z131 Encounter for screening for diabetes mellitus: Secondary | ICD-10-CM | POA: Diagnosis not present

## 2024-06-08 DIAGNOSIS — M1A09X0 Idiopathic chronic gout, multiple sites, without tophus (tophi): Secondary | ICD-10-CM | POA: Diagnosis not present

## 2024-06-08 DIAGNOSIS — N529 Male erectile dysfunction, unspecified: Secondary | ICD-10-CM

## 2024-06-08 DIAGNOSIS — M19012 Primary osteoarthritis, left shoulder: Secondary | ICD-10-CM

## 2024-06-08 DIAGNOSIS — G4733 Obstructive sleep apnea (adult) (pediatric): Secondary | ICD-10-CM

## 2024-06-08 DIAGNOSIS — E785 Hyperlipidemia, unspecified: Secondary | ICD-10-CM

## 2024-06-08 DIAGNOSIS — Z96612 Presence of left artificial shoulder joint: Secondary | ICD-10-CM

## 2024-06-08 DIAGNOSIS — I83219 Varicose veins of right lower extremity with both ulcer of unspecified site and inflammation: Secondary | ICD-10-CM

## 2024-06-08 DIAGNOSIS — M19072 Primary osteoarthritis, left ankle and foot: Secondary | ICD-10-CM

## 2024-06-08 DIAGNOSIS — N403 Nodular prostate with lower urinary tract symptoms: Secondary | ICD-10-CM | POA: Diagnosis not present

## 2024-06-08 DIAGNOSIS — I2510 Atherosclerotic heart disease of native coronary artery without angina pectoris: Secondary | ICD-10-CM

## 2024-06-08 DIAGNOSIS — Z8719 Personal history of other diseases of the digestive system: Secondary | ICD-10-CM

## 2024-06-08 DIAGNOSIS — K760 Fatty (change of) liver, not elsewhere classified: Secondary | ICD-10-CM

## 2024-06-08 DIAGNOSIS — M7061 Trochanteric bursitis, right hip: Secondary | ICD-10-CM

## 2024-06-08 DIAGNOSIS — L97929 Non-pressure chronic ulcer of unspecified part of left lower leg with unspecified severity: Secondary | ICD-10-CM

## 2024-06-08 DIAGNOSIS — M2242 Chondromalacia patellae, left knee: Secondary | ICD-10-CM

## 2024-06-08 DIAGNOSIS — I10 Essential (primary) hypertension: Secondary | ICD-10-CM

## 2024-06-08 DIAGNOSIS — L97919 Non-pressure chronic ulcer of unspecified part of right lower leg with unspecified severity: Secondary | ICD-10-CM

## 2024-06-08 DIAGNOSIS — R700 Elevated erythrocyte sedimentation rate: Secondary | ICD-10-CM | POA: Diagnosis not present

## 2024-06-08 DIAGNOSIS — Z860100 Personal history of colon polyps, unspecified: Secondary | ICD-10-CM

## 2024-06-08 DIAGNOSIS — I83229 Varicose veins of left lower extremity with both ulcer of unspecified site and inflammation: Secondary | ICD-10-CM

## 2024-06-08 DIAGNOSIS — Z6841 Body Mass Index (BMI) 40.0 and over, adult: Secondary | ICD-10-CM

## 2024-06-08 DIAGNOSIS — Z5181 Encounter for therapeutic drug level monitoring: Secondary | ICD-10-CM

## 2024-06-08 DIAGNOSIS — M19071 Primary osteoarthritis, right ankle and foot: Secondary | ICD-10-CM

## 2024-06-08 DIAGNOSIS — E291 Testicular hypofunction: Secondary | ICD-10-CM

## 2024-06-08 MED ORDER — ALLOPURINOL 300 MG PO TABS: 450.0000 mg | ORAL_TABLET | Freq: Every day | ORAL | 0 refills | Status: DC

## 2024-06-08 NOTE — Patient Instructions (Signed)
Heart Disease Prevention   Your inflammatory disease increases your risk of heart disease which includes heart attack, stroke, atrial fibrillation (irregular heartbeats), high blood pressure, heart failure and atherosclerosis (plaque in the arteries).  It is important to reduce your risk by:   Keep blood pressure, cholesterol, and blood sugar at healthy levels   Smoking Cessation   Maintain a healthy weight  BMI 20-25   Eat a healthy diet  Plenty of fresh fruit, vegetables, and whole grains  Limit saturated fats, foods high in sodium, and added sugars  DASH and Mediterranean diet   Increase physical activity  Recommend moderate physically activity for 150 minutes per week/ 30 minutes a day for five days a week These can be broken up into three separate ten-minute sessions during the day.   Reduce Stress  Meditation, slow breathing exercises, yoga, coloring books  Dental visits twice a year

## 2024-06-09 ENCOUNTER — Ambulatory Visit: Payer: Self-pay | Admitting: Rheumatology

## 2024-06-09 DIAGNOSIS — J3089 Other allergic rhinitis: Secondary | ICD-10-CM | POA: Diagnosis not present

## 2024-06-09 DIAGNOSIS — J301 Allergic rhinitis due to pollen: Secondary | ICD-10-CM | POA: Diagnosis not present

## 2024-06-09 DIAGNOSIS — J3081 Allergic rhinitis due to animal (cat) (dog) hair and dander: Secondary | ICD-10-CM | POA: Diagnosis not present

## 2024-06-09 LAB — COMPREHENSIVE METABOLIC PANEL WITH GFR
AG Ratio: 1.6 (calc) (ref 1.0–2.5)
ALT: 15 U/L (ref 9–46)
AST: 16 U/L (ref 10–35)
Albumin: 3.9 g/dL (ref 3.6–5.1)
Alkaline phosphatase (APISO): 73 U/L (ref 35–144)
BUN: 13 mg/dL (ref 7–25)
CO2: 27 mmol/L (ref 20–32)
Calcium: 9.8 mg/dL (ref 8.6–10.3)
Chloride: 104 mmol/L (ref 98–110)
Creat: 0.82 mg/dL (ref 0.70–1.35)
Globulin: 2.5 g/dL (ref 1.9–3.7)
Glucose, Bld: 141 mg/dL — ABNORMAL HIGH (ref 65–99)
Potassium: 4.2 mmol/L (ref 3.5–5.3)
Sodium: 137 mmol/L (ref 135–146)
Total Bilirubin: 0.7 mg/dL (ref 0.2–1.2)
Total Protein: 6.4 g/dL (ref 6.1–8.1)
eGFR: 97 mL/min/1.73m2 (ref >=60)

## 2024-06-09 LAB — CBC WITH DIFFERENTIAL/PLATELET
Absolute Lymphocytes: 1170 {cells}/uL (ref 850–3900)
Absolute Monocytes: 546 {cells}/uL (ref 200–950)
Basophils Absolute: 39 {cells}/uL (ref 0–200)
Basophils Relative: 0.6 %
Eosinophils Absolute: 169 {cells}/uL (ref 15–500)
Eosinophils Relative: 2.6 %
HCT: 43.5 % (ref 38.5–50.0)
Hemoglobin: 14 g/dL (ref 13.2–17.1)
MCH: 30.1 pg (ref 27.0–33.0)
MCHC: 32.2 g/dL (ref 32.0–36.0)
MCV: 93.5 fL (ref 80.0–100.0)
MPV: 10.3 fL (ref 7.5–12.5)
Monocytes Relative: 8.4 %
Neutro Abs: 4576 {cells}/uL (ref 1500–7800)
Neutrophils Relative %: 70.4 %
Platelets: 203 Thousand/uL (ref 140–400)
RBC: 4.65 Million/uL (ref 4.20–5.80)
RDW: 12.8 % (ref 11.0–15.0)
Total Lymphocyte: 18 %
WBC: 6.5 Thousand/uL (ref 3.8–10.8)

## 2024-06-09 LAB — PSA: PSA: 1.23 ng/mL (ref <=4.00)

## 2024-06-09 LAB — LIPID PANEL
Cholesterol: 100 mg/dL (ref <200)
HDL: 48 mg/dL (ref >=40)
LDL Cholesterol (Calc): 38 mg/dL
Non-HDL Cholesterol (Calc): 52 mg/dL (ref <130)
Total CHOL/HDL Ratio: 2.1 (calc) (ref <5.0)
Triglycerides: 49 mg/dL (ref <150)

## 2024-06-09 LAB — HEMOGLOBIN A1C
Hgb A1c MFr Bld: 5.6 % (ref <5.7)
Mean Plasma Glucose: 114 mg/dL
eAG (mmol/L): 6.3 mmol/L

## 2024-06-09 LAB — URIC ACID: Uric Acid, Serum: 3.8 mg/dL — ABNORMAL LOW (ref 4.0–8.0)

## 2024-06-09 NOTE — Progress Notes (Signed)
 Uric acid is in the desirable range at 3.8.  Glucose is elevated, probably not a fasting sample.  Lipid panel is normal.  PSA normal, CBC normal, CMP normal hemoglobin A1c normal at 5.6.  Please forward results to his PCP.

## 2024-06-10 ENCOUNTER — Encounter (INDEPENDENT_AMBULATORY_CARE_PROVIDER_SITE_OTHER): Payer: Self-pay

## 2024-06-11 DIAGNOSIS — M25512 Pain in left shoulder: Secondary | ICD-10-CM | POA: Diagnosis not present

## 2024-06-14 DIAGNOSIS — J3089 Other allergic rhinitis: Secondary | ICD-10-CM | POA: Diagnosis not present

## 2024-06-14 DIAGNOSIS — J3081 Allergic rhinitis due to animal (cat) (dog) hair and dander: Secondary | ICD-10-CM | POA: Diagnosis not present

## 2024-06-14 DIAGNOSIS — J301 Allergic rhinitis due to pollen: Secondary | ICD-10-CM | POA: Diagnosis not present

## 2024-06-15 DIAGNOSIS — M25512 Pain in left shoulder: Secondary | ICD-10-CM | POA: Diagnosis not present

## 2024-06-17 DIAGNOSIS — M25512 Pain in left shoulder: Secondary | ICD-10-CM | POA: Diagnosis not present

## 2024-06-21 DIAGNOSIS — J3089 Other allergic rhinitis: Secondary | ICD-10-CM | POA: Diagnosis not present

## 2024-06-21 DIAGNOSIS — J301 Allergic rhinitis due to pollen: Secondary | ICD-10-CM | POA: Diagnosis not present

## 2024-06-21 DIAGNOSIS — J3081 Allergic rhinitis due to animal (cat) (dog) hair and dander: Secondary | ICD-10-CM | POA: Diagnosis not present

## 2024-06-21 DIAGNOSIS — M25512 Pain in left shoulder: Secondary | ICD-10-CM | POA: Diagnosis not present

## 2024-06-22 ENCOUNTER — Ambulatory Visit (INDEPENDENT_AMBULATORY_CARE_PROVIDER_SITE_OTHER): Admitting: Adult Health

## 2024-06-22 VITALS — BP 134/85 | HR 61 | Ht 72.0 in | Wt 330.0 lb

## 2024-06-22 DIAGNOSIS — I10 Essential (primary) hypertension: Secondary | ICD-10-CM | POA: Diagnosis not present

## 2024-06-22 DIAGNOSIS — K760 Fatty (change of) liver, not elsewhere classified: Secondary | ICD-10-CM

## 2024-06-22 DIAGNOSIS — Z6841 Body Mass Index (BMI) 40.0 and over, adult: Secondary | ICD-10-CM

## 2024-06-22 DIAGNOSIS — E291 Testicular hypofunction: Secondary | ICD-10-CM

## 2024-06-22 NOTE — Progress Notes (Signed)
 Office: [phone]  /  Fax: [phone]   Initial Visit    Casey Roach was seen in clinic today to evaluate for obesity. He is interested in losing weight to improve overall health and reduce the risk of weight related complications. He presents today to review program treatment options, initial physical assessment, and evaluation.     Of Note- Pt worse 20 mmHg compression socks while on Bioimpedance scale Doubt validity of Bioimpedance readings Encouraged pt to present without compression socks on, weigh, then apply socks  He was referred by: Specialist  When asked what else they would like to accomplish? He states: Adopt a healthier eating pattern and lifestyle, Improve energy levels and physical activity, Improve existing medical conditions, Reduce number of medications, Improve quality of life, and Current weight 330 lbs.  Interval Goals: loos < 300 lbs, then lose to <250 lbs  When asked how has your weight affected you? He states: Having fatigue  Weight history: Weight gain since his late 30s  Highest weight: 358 lbs, current weight 330 lbs  Some associated conditions: Hypertension and Hyperlipidemia  Contributing factors: reduced physical activity  Weight promoting medications identified: None  Prior weight loss attempts: Balanced Plate / Portion Control  Current nutrition plan: High-protein and Portion control / smart choices  Current level of physical activity: Walking 30 minutes, three a week  Current or previous pharmacotherapy: None  Response to medication: Never tried medications   Past medical history includes:   Past Medical History:  Diagnosis Date   Acute bronchitis    Allergy    Benign prostatic hyperplasia with lower urinary tract symptoms    Erectile dysfunction    Gout    History of hiatal hernia    small noted on scan   Morbid obesity (HCC) 06/02/2008   Qualifier: Diagnosis of  By: Latisha CMA, Leigh     Obesity, unspecified     Obstructive sleep apnea (adult) (pediatric)    Prostatitis    Sleep apnea    Tubular adenoma of colon 11/2008   Unspecified essential hypertension    Venous stasis    Left ankle     Objective    BP 134/85   Pulse 61   Ht 6' (1.829 m)   Wt (!) 330 lb (149.7 kg)   SpO2 99%   BMI 44.76 kg/m  He was weighed on the bioimpedance scale: Body mass index is 44.76 kg/m.  Body Fat%:10.6 %, Visceral Fat Rating:11, Weight trend over the last 12 months: Decreasing  Pt worse 20 mmHg compression socks while on Bioimpedance scale Doubt validity of Bioimpedance readings Encouraged pt to present without compression socks on, weigh, then apply socks  General:  Alert, oriented and cooperative. Patient is in no acute distress.  Respiratory: Normal respiratory effort, no problems with respiration noted   Gait: able to ambulate independently  Mental Status: Normal mood and affect. Normal behavior. Normal judgment and thought content.   DIAGNOSTIC DATA REVIEWED:  BMET    Component Value Date/Time   NA 137 06/08/2024 1032   NA 136 01/30/2024 1221   K 4.2 06/08/2024 1032   CL 104 06/08/2024 1032   CO2 27 06/08/2024 1032   GLUCOSE 141 (H) 06/08/2024 1032   BUN 13 06/08/2024 1032   BUN 15 01/30/2024 1221   CREATININE 0.82 06/08/2024 1032   CALCIUM  9.8 06/08/2024 1032   GFRNONAA >60 04/01/2024 1150   GFRAA  10/27/2010 1323    >60  The eGFR has been calculated using the MDRD equation. This calculation has not been validated in all clinical situations. eGFR's persistently <60 mL/min signify possible Chronic Kidney Disease.   Lab Results  Component Value Date   HGBA1C 5.6 06/08/2024   No results found for: INSULIN CBC    Component Value Date/Time   WBC 6.5 06/08/2024 1032   RBC 4.65 06/08/2024 1032   HGB 14.0 06/08/2024 1032   HGB 16.0 01/30/2024 1225   HGB 18.4 (H) 08/14/2016 1013   HCT 43.5 06/08/2024 1032   HCT 49.0 01/30/2024 1225   HCT 52.1 (H) 08/14/2016 1013    PLT 203 06/08/2024 1032   PLT 166 01/30/2024 1225   MCV 93.5 06/08/2024 1032   MCV 97 01/30/2024 1225   MCV 90.3 08/14/2016 1013   MCH 30.1 06/08/2024 1032   MCHC 32.2 06/08/2024 1032   RDW 12.8 06/08/2024 1032   RDW 13.0 01/30/2024 1225   RDW 13.8 08/14/2016 1013   Iron/TIBC/Ferritin/ %Sat No results found for: IRON, TIBC, FERRITIN, IRONPCTSAT Lipid Panel     Component Value Date/Time   CHOL 100 06/08/2024 1032   CHOL 93 (L) 03/23/2024 0931   TRIG 49 06/08/2024 1032   HDL 48 06/08/2024 1032   HDL 43 03/23/2024 0931   CHOLHDL 2.1 06/08/2024 1032   LDLCALC 38 06/08/2024 1032   Hepatic Function Panel     Component Value Date/Time   PROT 6.4 06/08/2024 1032   ALBUMIN 4.3 07/14/2018 1208   AST 16 06/08/2024 1032   ALT 15 06/08/2024 1032   ALKPHOS 61 07/14/2018 1208   BILITOT 0.7 06/08/2024 1032   No results found for: TSH   Assessment and Plan   HYPERTENSION  Hepatic steatosis  Hypogonadism in male  Morbid obesity (HCC), STARTING BMI 44.8    Assessment and Plan          ESTABLISH WITH [facility]   Obesity Treatment / Action Plan:  Patient will work on garnering support from family and friends to begin weight loss journey. Will work on eliminating or reducing the presence of highly palatable, calorie dense foods in the home. Will complete provided nutritional and psychosocial assessment questionnaire before the next appointment. Will be scheduled for indirect calorimetry to determine resting energy expenditure in a fasting state.  This will allow us  to create a reduced calorie, high-protein meal plan to promote loss of fat mass while preserving muscle mass. Counseled on the health benefits of losing 5%-15% of total body weight. Was counseled on nutritional approaches to weight loss and benefits of reducing processed foods and consuming plant-based foods and high quality protein as part of nutritional weight management. Was counseled on pharmacotherapy and  role as an adjunct in weight management.   Obesity Education Performed Today:  He was weighed on the bioimpedance scale and results were discussed and documented in the synopsis.  We discussed obesity as a disease and the importance of a more detailed evaluation of all the factors contributing to the disease.  We discussed the importance of long term lifestyle changes which include nutrition, exercise and behavioral modifications as well as the importance of customizing this to his specific health and social needs.  We discussed the benefits of reaching a healthier weight to alleviate the symptoms of existing conditions and reduce the risks of the biomechanical, metabolic and psychological effects of obesity.  We reviewed the four pillars of obesity medicine and importance of using a multimodal approach.  We reviewed the basic principles in weight  management.   Casey Roach appears to be in the action stage of change and states they are ready to start intensive lifestyle modifications and behavioral modifications.  I have spent 26 minutes in the care of the patient today including: 3 minutes before the visit reviewing and preparing the chart. 20 minutes face-to-face assessing and reviewing listed medical problems as outlined in obesity care plan, providing nutritional and behavioral counseling on topics outlined in the obesity care plan, counseling regarding anti-obesity medication as outlined in obesity care plan, independently interpreting test results and goals of care, as described in assessment and plan, and reviewing and discussing biometric information and progress 3 minutes after the visit updating chart and documentation of encounter.  Reviewed by clinician on day of visit: allergies, medications, problem list, medical history, surgical history, family history, social history, and previous encounter notes pertinent to obesity diagnosis.  Rockie d, Molly Maselli, NP-C

## 2024-06-24 DIAGNOSIS — M25512 Pain in left shoulder: Secondary | ICD-10-CM | POA: Diagnosis not present

## 2024-06-28 DIAGNOSIS — M25512 Pain in left shoulder: Secondary | ICD-10-CM | POA: Diagnosis not present

## 2024-07-06 DIAGNOSIS — M25512 Pain in left shoulder: Secondary | ICD-10-CM | POA: Diagnosis not present

## 2024-07-07 DIAGNOSIS — J45991 Cough variant asthma: Secondary | ICD-10-CM | POA: Diagnosis not present

## 2024-07-07 DIAGNOSIS — J3089 Other allergic rhinitis: Secondary | ICD-10-CM | POA: Diagnosis not present

## 2024-07-07 DIAGNOSIS — J301 Allergic rhinitis due to pollen: Secondary | ICD-10-CM | POA: Diagnosis not present

## 2024-07-07 DIAGNOSIS — H1045 Other chronic allergic conjunctivitis: Secondary | ICD-10-CM | POA: Diagnosis not present

## 2024-07-09 DIAGNOSIS — M25512 Pain in left shoulder: Secondary | ICD-10-CM | POA: Diagnosis not present

## 2024-07-13 DIAGNOSIS — M25512 Pain in left shoulder: Secondary | ICD-10-CM | POA: Diagnosis not present

## 2024-07-19 DIAGNOSIS — M25512 Pain in left shoulder: Secondary | ICD-10-CM | POA: Diagnosis not present

## 2024-09-01 ENCOUNTER — Other Ambulatory Visit: Payer: Self-pay | Admitting: Rheumatology

## 2024-09-01 DIAGNOSIS — M1A09X0 Idiopathic chronic gout, multiple sites, without tophus (tophi): Secondary | ICD-10-CM

## 2024-09-01 NOTE — Telephone Encounter (Signed)
 Last Fill: 06/08/2024  Labs: 06/08/2024 Uric acid is in the desirable range at 3.8. Glucose is elevated, probably not a fasting sample. Lipid panel is normal. PSA normal, CBC normal, CMP normal hemoglobin A1c normal at 5.6. Please forward results to his PCP.   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   Okay to refill Allopurinol ?

## 2024-12-07 ENCOUNTER — Ambulatory Visit: Admitting: Physician Assistant
